# Patient Record
Sex: Female | Born: 1990 | Race: Black or African American | Hispanic: No | Marital: Single | Smoking: Former smoker
Health system: Southern US, Community
[De-identification: ages and names within clinical notes are randomized; demographics above are authoritative.]

## PROBLEM LIST (undated history)

## (undated) ENCOUNTER — Inpatient Hospital Stay (HOSPITAL_COMMUNITY): Payer: Self-pay

## (undated) DIAGNOSIS — F32A Depression, unspecified: Secondary | ICD-10-CM

## (undated) DIAGNOSIS — F329 Major depressive disorder, single episode, unspecified: Secondary | ICD-10-CM

## (undated) DIAGNOSIS — G919 Hydrocephalus, unspecified: Secondary | ICD-10-CM

## (undated) DIAGNOSIS — R7989 Other specified abnormal findings of blood chemistry: Secondary | ICD-10-CM

## (undated) DIAGNOSIS — J45909 Unspecified asthma, uncomplicated: Secondary | ICD-10-CM

## (undated) DIAGNOSIS — F319 Bipolar disorder, unspecified: Secondary | ICD-10-CM

## (undated) DIAGNOSIS — E669 Obesity, unspecified: Secondary | ICD-10-CM

## (undated) DIAGNOSIS — R945 Abnormal results of liver function studies: Secondary | ICD-10-CM

## (undated) DIAGNOSIS — I10 Essential (primary) hypertension: Secondary | ICD-10-CM

## (undated) DIAGNOSIS — A599 Trichomoniasis, unspecified: Secondary | ICD-10-CM

## (undated) HISTORY — PX: BRAIN SURGERY: SHX531

---

## 2016-01-07 ENCOUNTER — Emergency Department (HOSPITAL_COMMUNITY)
Admission: EM | Admit: 2016-01-07 | Discharge: 2016-01-07 | Disposition: A | Discharge status: Home / Self Care | Payer: Self-pay | Attending: Emergency Medicine | Admitting: Emergency Medicine

## 2016-01-07 ENCOUNTER — Encounter (HOSPITAL_COMMUNITY): Payer: Self-pay | Admitting: *Deleted

## 2016-01-07 DIAGNOSIS — L309 Dermatitis, unspecified: Secondary | ICD-10-CM | POA: Insufficient documentation

## 2016-01-07 DIAGNOSIS — L03313 Cellulitis of chest wall: Secondary | ICD-10-CM | POA: Insufficient documentation

## 2016-01-07 DIAGNOSIS — R21 Rash and other nonspecific skin eruption: Secondary | ICD-10-CM

## 2016-01-07 MED ORDER — DOXYCYCLINE HYCLATE 100 MG PO CAPS: 100.0000 mg | ORAL_CAPSULE | Freq: Two times a day (BID) | ORAL | Status: DC

## 2016-01-07 NOTE — ED Provider Notes (Signed)
CSN: [id number]     Arrival date & time 01/07/16  2103 History  By signing my name below, I, Emmanuella Mensah, attest that this documentation has been prepared under the direction and in the presence of Roxy Horseman, PA-C. Electronically Signed: Angelene Giovanni, ED Scribe. 01/07/2016. 9:58 PM.    Chief Complaint  Patient presents with  . Rash   The history is provided by the patient. No language interpreter was used.    HPI Comments: Jean Mata is a 25 y.o. female with a hx of eczema who presents to the Emergency Department complaining of gradually worsening small painful rash on bilateral nipples onset 3 days ago. She denies any discharge. She explains that she initially attributed her symptoms to her eczema but they are usually not painful. No alleviating factors noted. Pt has not taken any medications for her symptoms. Pt is currently on birth control. No fever or SOB.     History reviewed. No pertinent past medical history. History reviewed. No pertinent past surgical history. No family history on file. Social History  Substance Use Topics  . Smoking status: Never Smoker   . Smokeless tobacco: None  . Alcohol Use: Yes   OB History    No data available     Review of Systems  Constitutional: Negative for fever.  Respiratory: Negative for shortness of breath.   Skin: Positive for rash. Negative for wound.      Allergies  Review of patient's allergies indicates not on file.  Home Medications   Prior to Admission medications   Not on File   BP 136/85 mmHg  Pulse 106  Temp(Src) 98.9 F (37.2 C) (Oral)  Resp 18  SpO2 100%  LMP 09/07/2015 Physical Exam Physical Exam  Constitutional: Pt appears well-developed and well-nourished. No distress.  Awake, alert, nontoxic appearance  HENT:  Head: Normocephalic and atraumatic.  Mouth/Throat: Oropharynx is clear and moist. No oropharyngeal exudate.  Eyes: Conjunctivae are normal. No scleral icterus.  Neck: Normal  range of motion. Neck supple.  Cardiovascular: Normal rate, regular rhythm and intact distal pulses.   Pulmonary/Chest: Effort normal and breath sounds normal. No respiratory distress. Pt has no wheezes.  Equal chest expansion  Abdominal: Soft. Bowel sounds are normal. Pt exhibits no mass. There is no tenderness. There is no rebound and no guarding.  Musculoskeletal: Normal range of motion. Pt exhibits no edema.  Neurological: Pt is alert.  Speech is clear and goal oriented Moves extremities without ataxia  Skin: Skin is warm and dry. Pt is not diaphoretic. Nurse and scribe chaperone present for exam. Very mild rash near the right areola, no discharge, no abscess.  Psychiatric: Pt has a normal mood and affect.  Nursing note and vitals reviewed.  ED Course  Procedures (including critical care time) DIAGNOSTIC STUDIES: Oxygen Saturation is 100% on RA, normal by my interpretation.    COORDINATION OF CARE: 9:54 PM- Pt advised of plan for treatment and pt agrees. Pt will be placed on antibiotic to treat for superficial skin infection. Will provide resources for follow up with [facility].     MDM   Final diagnoses:  Rash  Eczema  Cellulitis of chest wall    Possible developing cellulitis vs mastitis.  No pregnant or breastfeeding.  Suspect some trauma to areolas from scratching or intimacy.  No discharge, no mass.  Will start on doxy.  Recommend close follow-up with OBGYN in 3 days.  Return for new or worsening symptoms.  I personally performed the services  described in this documentation, which was scribed in my presence. The recorded information has been reviewed and is accurate.    Roxy Horseman, PA-C 01/07/16 2204  Mancel Bale, MD 01/07/16 [phone]

## 2016-01-07 NOTE — ED Notes (Addendum)
Pt complains of painful rash on both nipples underarms for the past 3 days. Pt states she has a hx of eczema but states it is usually not painful.  Pt denies use of new soaps, detergents, lotions.

## 2016-01-07 NOTE — Discharge Instructions (Signed)
Your rash may be a developing cellulitis.  Please take the antibiotics as directed.  Please do not scratch or pick at the rash.  If you develop fever, discharge, worsening pain, or swelling return to the ER.  Otherwise, make an appointment with an OBGYN in 3 days for recheck.  Cellulitis Cellulitis is an infection of the skin and the tissue beneath it. The infected area is usually red and tender. Cellulitis occurs most often in the arms and lower legs.  CAUSES  Cellulitis is caused by bacteria that enter the skin through cracks or cuts in the skin. The most common types of bacteria that cause cellulitis are staphylococci and streptococci. SIGNS AND SYMPTOMS   Redness and warmth.  Swelling.  Tenderness or pain.  Fever. DIAGNOSIS  Your health care provider can usually determine what is wrong based on a physical exam. Blood tests may also be done. TREATMENT  Treatment usually involves taking an antibiotic medicine. HOME CARE INSTRUCTIONS   Take your antibiotic medicine as directed by your health care provider. Finish the antibiotic even if you start to feel better.  Keep the infected arm or leg elevated to reduce swelling.  Apply a warm cloth to the affected area up to 4 times per day to relieve pain.  Take medicines only as directed by your health care provider.  Keep all follow-up visits as directed by your health care provider. SEEK MEDICAL CARE IF:   You notice red streaks coming from the infected area.  Your red area gets larger or turns dark in color.  Your bone or joint underneath the infected area becomes painful after the skin has healed.  Your infection returns in the same area or another area.  You notice a swollen bump in the infected area.  You develop new symptoms.  You have a fever. SEEK IMMEDIATE MEDICAL CARE IF:   You feel very sleepy.  You develop vomiting or diarrhea.  You have a general ill feeling (malaise) with muscle aches and pains.   This  information is not intended to replace advice given to you by your health care provider. Make sure you discuss any questions you have with your health care provider.   Document Released: 09/13/2005 Document Revised: 08/25/2015 Document Reviewed: 02/19/2012 Elsevier Interactive Patient Education [employer]

## 2016-10-30 ENCOUNTER — Encounter (HOSPITAL_COMMUNITY): Payer: Self-pay

## 2016-10-30 ENCOUNTER — Emergency Department (HOSPITAL_COMMUNITY): Payer: Self-pay

## 2016-10-30 ENCOUNTER — Emergency Department (HOSPITAL_COMMUNITY)
Admission: EM | Admit: 2016-10-30 | Discharge: 2016-10-30 | Disposition: A | Discharge status: Home / Self Care | Payer: Self-pay | Attending: Emergency Medicine | Admitting: Emergency Medicine

## 2016-10-30 DIAGNOSIS — J45909 Unspecified asthma, uncomplicated: Secondary | ICD-10-CM | POA: Insufficient documentation

## 2016-10-30 DIAGNOSIS — Y939 Activity, unspecified: Secondary | ICD-10-CM | POA: Insufficient documentation

## 2016-10-30 DIAGNOSIS — Y999 Unspecified external cause status: Secondary | ICD-10-CM | POA: Insufficient documentation

## 2016-10-30 DIAGNOSIS — G919 Hydrocephalus, unspecified: Secondary | ICD-10-CM

## 2016-10-30 DIAGNOSIS — W19XXXA Unspecified fall, initial encounter: Secondary | ICD-10-CM

## 2016-10-30 DIAGNOSIS — Y92009 Unspecified place in unspecified non-institutional (private) residence as the place of occurrence of the external cause: Secondary | ICD-10-CM | POA: Insufficient documentation

## 2016-10-30 DIAGNOSIS — R42 Dizziness and giddiness: Secondary | ICD-10-CM

## 2016-10-30 HISTORY — DX: Unspecified asthma, uncomplicated: J45.909

## 2016-10-30 HISTORY — DX: Major depressive disorder, single episode, unspecified: F32.9

## 2016-10-30 HISTORY — DX: Depression, unspecified: F32.A

## 2016-10-30 MED ORDER — ACETAMINOPHEN 325 MG PO TABS
650.0000 mg | ORAL_TABLET | Freq: Once | ORAL | Status: AC
  Administered 2016-10-30: 650 mg via ORAL
  Filled 2016-10-30: qty 2

## 2016-10-30 MED ORDER — DIAZEPAM 5 MG PO TABS
5.0000 mg | ORAL_TABLET | Freq: Once | ORAL | Status: AC
  Administered 2016-10-30: 5 mg via ORAL
  Filled 2016-10-30: qty 1

## 2016-10-30 NOTE — Discharge Instructions (Signed)
You were seen today for an episode of vertigo followed by a fall. Your CT scan shows hydrocephalus. You have a history of this but report that your prior imaging has been reassuring. I cannot compare imaging today because I do not have access to your outside records. Given the you are able to walk without difficulty and your neurologic exam is otherwise unremarkable, you can be discharged home. It is very important that he follow back up with Dr. Marice PotterFuchs for repeat evaluation. If you have any new or worsening symptoms she needs to be reevaluated.

## 2016-10-30 NOTE — Progress Notes (Signed)
Ambulated well to the bathroom and to the hallway without assistance.

## 2016-10-30 NOTE — ED Provider Notes (Signed)
[facility]-EMERGENCY DEPT Provider Note   CSN: [id number] Arrival date & time: 10/30/16  0001    By signing my name below, I, Valentino SaxonBianca Contreras, attest that this documentation has been prepared under the direction and in the presence of Shon Batonourtney F Cary Lothrop, MD. Electronically Signed: Valentino SaxonBianca Contreras, ED Scribe. 10/30/16. 1:23 AM.  History   Chief Complaint Chief Complaint  Patient presents with  . Fall   The history is provided by the patient. No language interpreter was used.    HPI Comments: Jean Mata is a 25 y.o. female h/o of hydrocephalus, who presents to the Emergency Department complaining of moderate, constant, HA s/p fall that occurred at ~ 9pm this evening. Pt reports she fell twice today. She notes with the first fall, she fell and landed on her knees but was able to ambulate. She denies head injury and LOC. Pt then states she fell a second time immediately after the first fall, when she stood up after putting her shoes on. She notes she stood up and fell and hit her head on the wall and on a nearby table. Pt denies LOC. She reports associated HA to the localized area of impact. Pt also describes having accompanied dizziness with Ha. Per pt, she endures her dizziness with standing. She notes the room is spinning. Pt denies nausea, vomiting, abdominal pain. Pt reports no modifying factors noted. She also denies any recent illnesses. Pt states she has not had any drinks today, but does admit to illicit drug use. She also notes being on oral contraceptives. No additional complaints at this time.   Past Medical History:  Diagnosis Date  . Asthma   . Depression     There are no active problems to display for this patient.   Past Surgical History:  Procedure Laterality Date  . BRAIN SURGERY      OB History    No data available       Home Medications    Prior to Admission medications   Medication Sig Start Date End Date Taking? Authorizing Provider  doxycycline  (VIBRAMYCIN) 100 MG capsule Take 1 capsule (100 mg total) by mouth 2 (two) times daily. 01/07/16   Roxy Horsemanobert Browning, PA-C    Family History History reviewed. No pertinent family history.  Social History Social History  Substance Use Topics  . Smoking status: Never Smoker  . Smokeless tobacco: Never Used  . Alcohol use Yes     Allergies   Patient has no allergy information on record.   Review of Systems Review of Systems  Constitutional: Negative for fever.  Respiratory: Negative for shortness of breath.   Cardiovascular: Negative for chest pain.  Gastrointestinal: Negative for abdominal pain, nausea and vomiting.  Neurological: Positive for dizziness and headaches. Negative for syncope.  All other systems reviewed and are negative.    Physical Exam Updated Vital Signs BP 127/85 (BP Location: Left Arm)   Pulse 88   Temp 98 F (36.7 C) (Oral)   Resp 16   Ht 6\' 2"  (1.88 m)   Wt 285 lb (129.3 kg)   SpO2 98%   BMI 36.59 kg/m   Physical Exam  Constitutional: She is oriented to person, place, and time. She appears well-developed and well-nourished. No distress.  HENT:  Head: Normocephalic and atraumatic.  Eyes: EOM are normal. Pupils are equal, round, and reactive to light.  No nystagmus noted  Neck: Normal range of motion. Neck supple.  Cardiovascular: Normal rate, regular rhythm and normal heart sounds.  No murmur heard. Pulmonary/Chest: Effort normal and breath sounds normal. No respiratory distress. She has no wheezes.  Abdominal: Soft. Bowel sounds are normal. There is no tenderness. There is no guarding.  Neurological: She is alert and oriented to person, place, and time.  Cranial nerves II through X intact, 5 out of 5 strength in all 4 stories, no dysmetria to finger-nose-finger, normal gait  Skin: Skin is warm and dry.  Psychiatric: She has a normal mood and affect.  Nursing note and vitals reviewed.    ED Treatments / Results   DIAGNOSTIC  STUDIES: Oxygen Saturation is 100% on RA, normal by my interpretation.    COORDINATION OF CARE: 1:13 AM Discussed treatment plan with pt at bedside which includes Head CT w/o contrast, labs, EKG and pt agreed to plan.   Labs (all labs ordered are listed, but only abnormal results are displayed) Labs Reviewed  PREGNANCY, URINE    EKG  EKG Interpretation None       Radiology Ct Head Wo Contrast  Result Date: 10/30/2016 CLINICAL DATA:  25 y/o F; constant headache and dizziness after fall. History of hydrocephalus. EXAM: CT HEAD WITHOUT CONTRAST TECHNIQUE: Contiguous axial images were obtained from the base of the skull through the vertex without intravenous contrast. COMPARISON:  None. FINDINGS: Brain: No evidence of large territory infarct or intracranial hemorrhage. No extra-axial collection. Moderate hydrocephalus of lateral and third ventricles. Normal size fourth ventricle. Vascular: No hyperdense vessel or unexpected calcification. Skull: No calvarial fracture for right frontal pole from prior ventriculostomy catheter. Sinuses/Orbits: No acute finding. Other: None. IMPRESSION: 1. No evidence of large acute infarct or intracranial hemorrhage. 2. 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 3. Right frontal burr hole from prior ventriculostomy catheter. Electronically Signed   By: Mitzi HansenLance  Furusawa-Stratton M.D.   On: 10/30/2016 02:04    Procedures Procedures (including critical care time)  Medications Ordered in ED Medications  acetaminophen (TYLENOL) tablet 650 mg (650 mg Oral Given 10/30/16 0210)  diazepam (VALIUM) tablet 5 mg (5 mg Oral Given 10/30/16 0210)     Initial Impression / Assessment and Plan / ED Course  I have reviewed the triage vital signs and the nursing notes.  Pertinent labs & imaging results that were available during my care of the patient were reviewed by me and considered  in my medical decision making (see chart for details).  Clinical Course as of Oct 30 518  Mon Oct 30, 2016  [id number] Patient with hydrocephalus on CT scan. Reports that her last MRI was "clear."  Unfortunately, I cannot review her outside records and care everywhere. I am attempting to obtain her records from [facility]. Discussed with neurology. He recommends neurosurgery consultation and likely MRI.  [CH]    Clinical Course User Index [CH] Shon Batonourtney F Arriyah Madej, MD    Patient presents with dizziness followed by fall. Describes vertiginous symptoms. Currently neurologically intact. History of hydrocephalus. She is alert and oriented. No somnolence. Neuro exam is intact. Per [place] CT head rules, patient is low risk; however, given her history of hydrocephalus and vertiginous symptoms, will obtain CT scan to evaluate. Patient reports that "I had an MRI one year ago that was normal." I'm unable to review outside records in care everywhere. Patient reports her neurosurgeon was Dr. Marice PotterFuchs.  I have requested records. CT scan does show hydrocephalus with likely obstruction at the aqueduct. Unclear whether this is truly acute. She is neurologically intact and  ambulatory. She's currently symptom-free. Discuss with neurosurgery, Dr. Conchita Paris, who does not feel the patient needs further imaging at this time. If neurologic exam is reassuring, patient to follow-up with her neurosurgeon as an outpatient. Discussed this with the patient. Patient stated understanding.  After history, exam, and medical workup I feel the patient has been appropriately medically screened and is safe for discharge home. Pertinent diagnoses were discussed with the patient. Patient was given return precautions.   Final Clinical Impressions(s) / ED Diagnoses   Final diagnoses:  Vertigo  Fall, initial encounter  Hydrocephalus    New Prescriptions New Prescriptions   No medications on file   I personally performed the services described in  this documentation, which was scribed in my presence. The recorded information has been reviewed and is accurate.     Shon Baton, MD 10/30/16 [phone]

## 2016-10-30 NOTE — ED Triage Notes (Signed)
Fell twice and home hitting head now pt states she doesn't know what day it is and head pain voiced no N/V noted steady gait noted.

## 2017-03-15 ENCOUNTER — Encounter (HOSPITAL_BASED_OUTPATIENT_CLINIC_OR_DEPARTMENT_OTHER): Payer: Self-pay | Admitting: Emergency Medicine

## 2017-03-15 ENCOUNTER — Emergency Department (HOSPITAL_BASED_OUTPATIENT_CLINIC_OR_DEPARTMENT_OTHER)
Admission: EM | Admit: 2017-03-15 | Discharge: 2017-03-15 | Disposition: A | Discharge status: Home / Self Care | Payer: Self-pay | Attending: Emergency Medicine | Admitting: Emergency Medicine

## 2017-03-15 DIAGNOSIS — R197 Diarrhea, unspecified: Secondary | ICD-10-CM | POA: Insufficient documentation

## 2017-03-15 DIAGNOSIS — J45909 Unspecified asthma, uncomplicated: Secondary | ICD-10-CM | POA: Insufficient documentation

## 2017-03-15 DIAGNOSIS — R1032 Left lower quadrant pain: Secondary | ICD-10-CM | POA: Insufficient documentation

## 2017-03-15 DIAGNOSIS — R1031 Right lower quadrant pain: Secondary | ICD-10-CM | POA: Insufficient documentation

## 2017-03-15 DIAGNOSIS — R112 Nausea with vomiting, unspecified: Secondary | ICD-10-CM | POA: Insufficient documentation

## 2017-03-15 HISTORY — DX: Hydrocephalus, unspecified: G91.9

## 2017-03-15 HISTORY — DX: Obesity, unspecified: E66.9

## 2017-03-15 LAB — CBC WITH DIFFERENTIAL/PLATELET
Basophils Absolute: 0 K/uL (ref 0.0–0.1)
Basophils Relative: 0 %
Eosinophils Absolute: 0.1 K/uL (ref 0.0–0.7)
Eosinophils Relative: 1 %
HCT: 39 % (ref 36.0–46.0)
Hemoglobin: 13.1 g/dL (ref 12.0–15.0)
Lymphocytes Relative: 33 %
Lymphs Abs: 3 K/uL (ref 0.7–4.0)
MCH: 29.4 pg (ref 26.0–34.0)
MCHC: 33.6 g/dL (ref 30.0–36.0)
MCV: 87.4 fL (ref 78.0–100.0)
Monocytes Absolute: 0.8 K/uL (ref 0.1–1.0)
Monocytes Relative: 9 %
Neutro Abs: 5.3 K/uL (ref 1.7–7.7)
Neutrophils Relative %: 57 %
Platelets: 414 K/uL — ABNORMAL HIGH (ref 150–400)
RBC: 4.46 MIL/uL (ref 3.87–5.11)
RDW: 13.1 % (ref 11.5–15.5)
WBC: 9.3 K/uL (ref 4.0–10.5)

## 2017-03-15 LAB — URINALYSIS, ROUTINE W REFLEX MICROSCOPIC
Glucose, UA: NEGATIVE mg/dL
Ketones, ur: NEGATIVE mg/dL
NITRITE: NEGATIVE
PH: 5.5 (ref 5.0–8.0)
Protein, ur: 30 mg/dL — AB
SPECIFIC GRAVITY, URINE: 1.03 (ref 1.005–1.030)

## 2017-03-15 LAB — COMPREHENSIVE METABOLIC PANEL WITH GFR
ALT: 47 U/L (ref 14–54)
AST: 38 U/L (ref 15–41)
Albumin: 4 g/dL (ref 3.5–5.0)
Alkaline Phosphatase: 64 U/L (ref 38–126)
Anion gap: 14 (ref 5–15)
BUN: 9 mg/dL (ref 6–20)
CO2: 23 mmol/L (ref 22–32)
Calcium: 9.4 mg/dL (ref 8.9–10.3)
Chloride: 102 mmol/L (ref 101–111)
Creatinine, Ser: 0.71 mg/dL (ref 0.44–1.00)
GFR calc Af Amer: >60 mL/min
GFR calc non Af Amer: >60 mL/min
Glucose, Bld: 88 mg/dL (ref 65–99)
Potassium: 3.7 mmol/L (ref 3.5–5.1)
Sodium: 139 mmol/L (ref 135–145)
Total Bilirubin: 1.1 mg/dL (ref 0.3–1.2)
Total Protein: 8.4 g/dL — ABNORMAL HIGH (ref 6.5–8.1)

## 2017-03-15 LAB — URINALYSIS, MICROSCOPIC (REFLEX)

## 2017-03-15 LAB — PREGNANCY, URINE: Preg Test, Ur: NEGATIVE

## 2017-03-15 MED ORDER — ONDANSETRON HCL 4 MG/2ML IJ SOLN
4.0000 mg | Freq: Once | INTRAMUSCULAR | Status: AC
  Administered 2017-03-15: 4 mg via INTRAVENOUS
  Filled 2017-03-15: qty 2

## 2017-03-15 MED ORDER — DICYCLOMINE HCL 10 MG PO CAPS
10.0000 mg | ORAL_CAPSULE | Freq: Once | ORAL | Status: AC
  Administered 2017-03-15: 10 mg via ORAL
  Filled 2017-03-15: qty 1

## 2017-03-15 MED ORDER — SODIUM CHLORIDE 0.9 % IV BOLUS (SEPSIS)
1000.0000 mL | Freq: Once | INTRAVENOUS | Status: AC
  Administered 2017-03-15: 1000 mL via INTRAVENOUS

## 2017-03-15 MED ORDER — ONDANSETRON 4 MG PO TBDP: 4.0000 mg | ORAL_TABLET | Freq: Three times a day (TID) | ORAL | 0 refills | Status: DC | PRN

## 2017-03-15 NOTE — ED Notes (Signed)
ED Provider at bedside discussing test results and plan of care. 

## 2017-03-15 NOTE — ED Provider Notes (Signed)
[facility] Provider Note   CSN: [id number] Arrival date & time: 03/15/17  1757  By signing my name below, I, Modena JanskyAlbert Thayil, attest that this documentation has been prepared under the direction and in the presence of non-physician practitioner, Chase PicketJaime Pilcher Celso Granja, PA-C. Electronically Signed: Modena JanskyAlbert Thayil, Scribe. 03/15/2017. 6:24 PM.  History   Chief Complaint Chief Complaint  Patient presents with  . Diarrhea   The history is provided by the patient. No language interpreter was used.   HPI Comments: Jean Mata is a 26 y.o. female who presents to the Emergency Department complaining of constant moderate bilateral lower abdominal pain that started about 4 days ago. She ate dinner at work 5 days ago and suspects food poisoning. She took Tylenol PTA with minimal relief. Her pain is described as a cramping sensation. She reports associated dry mouth, nausea, vomiting (one episode on first day), and diarrhea (exacerbated by PO intake). She notes multiple non-bloody loose stools daily, three today prior to arrival. She denies any fevers, vaginal discharge, urinary urgency/frequency, dysuria, chest pain, shortness of breath, cough or other complaints.   Past Medical History:  Diagnosis Date  . Asthma   . Depression   . Hydrocephalus   . Obesity     There are no active problems to display for this patient.   Past Surgical History:  Procedure Laterality Date  . BRAIN SURGERY      OB History    No data available       Home Medications    Prior to Admission medications   Medication Sig Start Date End Date Taking? Authorizing Provider  levonorgestrel-ethinyl estradiol (NORDETTE) 0.15-30 MG-MCG tablet Take 1 tablet by mouth daily.   Yes Historical Provider, MD  doxycycline (VIBRAMYCIN) 100 MG capsule Take 1 capsule (100 mg total) by mouth 2 (two) times daily. 01/07/16   Roxy Horsemanobert Browning, PA-C  ondansetron (ZOFRAN ODT) 4 MG disintegrating tablet Take 1 tablet (4 mg  total) by mouth every 8 (eight) hours as needed for nausea or vomiting. 03/15/17   Chase PicketJaime Pilcher Esaul Dorwart, PA-C    Family History No family history on file.  Social History Social History  Substance Use Topics  . Smoking status: Never Smoker  . Smokeless tobacco: Never Used  . Alcohol use Yes     Comment: occ     Allergies   Patient has no known allergies.   Review of Systems Review of Systems  Gastrointestinal: Positive for abdominal pain (Lower), diarrhea, nausea and vomiting (Once, now resolved).  All other systems reviewed and are negative.    Physical Exam Updated Vital Signs BP (!) 144/97 (BP Location: Left Arm)   Pulse (!) 102   Temp 98.7 F (37.1 C) (Oral)   Resp (!) 22   Ht 6\' 2"  (1.88 m)   Wt 285 lb (129.3 kg)   LMP 11/15/2016 (Approximate)   SpO2 100%   BMI 36.59 kg/m   Physical Exam  Constitutional: She is oriented to person, place, and time. She appears well-developed and well-nourished. No distress.  Non-toxic appearing.  HENT:  Head: Normocephalic and atraumatic.  Cardiovascular: Normal rate, regular rhythm and normal heart sounds.   No murmur heard. Pulmonary/Chest: Effort normal and breath sounds normal. No respiratory distress.  Abdominal: Soft. She exhibits no distension. There is tenderness. There is no rebound and no guarding.  TTP across lower abdomen with no rebound or guarding. No CVA tenderness.  Musculoskeletal: She exhibits no edema.  Neurological: She is alert and oriented to  person, place, and time.  Skin: Skin is warm and dry. Capillary refill takes 2 to 3 seconds.  Nursing note and vitals reviewed.    ED Treatments / Results  DIAGNOSTIC STUDIES: Oxygen Saturation is 100% on RA, normal by my interpretation.    COORDINATION OF CARE: 6:28 PM- Pt advised of plan for treatment and pt agrees.  Labs (all labs ordered are listed, but only abnormal results are displayed) Labs Reviewed  URINALYSIS, ROUTINE W REFLEX MICROSCOPIC -  Abnormal; Notable for the following:       Result Value   Color, Urine AMBER (*)    APPearance CLOUDY (*)    Hgb urine dipstick MODERATE (*)    Bilirubin Urine SMALL (*)    Protein, ur 30 (*)    Leukocytes, UA TRACE (*)    All other components within normal limits  COMPREHENSIVE METABOLIC PANEL - Abnormal; Notable for the following:    Total Protein 8.4 (*)    All other components within normal limits  CBC WITH DIFFERENTIAL/PLATELET - Abnormal; Notable for the following:    Platelets 414 (*)    All other components within normal limits  URINALYSIS, MICROSCOPIC (REFLEX) - Abnormal; Notable for the following:    Bacteria, UA FEW (*)    Squamous Epithelial / LPF 6-30 (*)    All other components within normal limits  PREGNANCY, URINE    EKG  EKG Interpretation None       Radiology No results found.  Procedures Procedures (including critical care time)  Medications Ordered in ED Medications  sodium chloride 0.9 % bolus 1,000 mL (0 mLs Intravenous Stopped 03/15/17 1927)  dicyclomine (BENTYL) capsule 10 mg (10 mg Oral Given 03/15/17 1840)  ondansetron (ZOFRAN) injection 4 mg (4 mg Intravenous Given 03/15/17 2006)     Initial Impression / Assessment and Plan / ED Course  I have reviewed the triage vital signs and the nursing notes.  Pertinent labs & imaging results that were available during my care of the patient were reviewed by me and considered in my medical decision making (see chart for details).    Jean Mata is a 26 y.o. female who presents to ED for n/v/d x 4 days. No emesis over the last three days, diarrhea has persisted. On exam, patient is afebrile, hemodynamically stable with nonsurgical abdomen. She does seem mildly dehydrated, fluid bolus given. Labs reviewed and reassuring. Urine does not appear infectious, but does show signs of dehydration as well. Sxs likely 2/2 viral illness. On reevaluation, abdominal exam improved. Patient is tolerating PO. Rx for  Zofran given. Reasons to return to the ER were discussed and all questions answered.   Final Clinical Impressions(s) / ED Diagnoses   Final diagnoses:  Nausea vomiting and diarrhea    New Prescriptions New Prescriptions   ONDANSETRON (ZOFRAN ODT) 4 MG DISINTEGRATING TABLET    Take 1 tablet (4 mg total) by mouth every 8 (eight) hours as needed for nausea or vomiting.   I personally performed the services described in this documentation, which was scribed in my presence. The recorded information has been reviewed and is accurate.     [facility] Righteous Claiborne, PA-C 03/15/17 2017    Geoffery Lyons, MD 03/15/17 2118

## 2017-03-15 NOTE — ED Triage Notes (Signed)
Low abd pain x4 days after eating at a dinner at work.  Sts she believes she got food poisoning.  Vomiting on day one but not since.  Diarrhea and abd pain only.

## 2017-03-15 NOTE — Discharge Instructions (Signed)
Follow up with your primary care doctor to discuss your hospital visit. Continue to hydrate orally with small sips of fluids throughout the day. Use Zofran as directed for nausea & vomiting.  ° °The 'BRAT' diet is suggested, then progress to diet as tolerated as symptoms abate.  °Bananas.  °Rice.  °Applesauce.  °Toast (and other simple starches such as crackers, potatoes, noodles).  ° °SEEK IMMEDIATE MEDICAL ATTENTION IF: °You begin having localized abdominal pain that does not go away or becomes severe °A temperature above 101 develops °Repeated vomiting occurs (multiple uncontrollable episodes) or you are unable to keep fluids down °Blood is being passed in stools or vomit (bright red or black tarry stools).  °If you develop chest pain, difficulty breathing, dizziness or fainting, or become confused, poorly responsive, or inconsolable (young children). °

## 2018-01-08 ENCOUNTER — Encounter (HOSPITAL_BASED_OUTPATIENT_CLINIC_OR_DEPARTMENT_OTHER): Payer: Self-pay

## 2018-01-08 ENCOUNTER — Emergency Department (HOSPITAL_BASED_OUTPATIENT_CLINIC_OR_DEPARTMENT_OTHER)
Admission: EM | Admit: 2018-01-08 | Discharge: 2018-01-08 | Disposition: A | Discharge status: Home / Self Care | Payer: Federal, State, Local not specified - PPO | Attending: Emergency Medicine | Admitting: Emergency Medicine

## 2018-01-08 ENCOUNTER — Other Ambulatory Visit: Payer: Self-pay

## 2018-01-08 DIAGNOSIS — J45909 Unspecified asthma, uncomplicated: Secondary | ICD-10-CM | POA: Diagnosis not present

## 2018-01-08 DIAGNOSIS — Z87891 Personal history of nicotine dependence: Secondary | ICD-10-CM | POA: Diagnosis not present

## 2018-01-08 DIAGNOSIS — F329 Major depressive disorder, single episode, unspecified: Secondary | ICD-10-CM | POA: Insufficient documentation

## 2018-01-08 DIAGNOSIS — R112 Nausea with vomiting, unspecified: Secondary | ICD-10-CM | POA: Insufficient documentation

## 2018-01-08 DIAGNOSIS — R197 Diarrhea, unspecified: Secondary | ICD-10-CM | POA: Insufficient documentation

## 2018-01-08 LAB — CBC
HEMATOCRIT: 39.4 % (ref 36.0–46.0)
HEMOGLOBIN: 13.1 g/dL (ref 12.0–15.0)
MCH: 29.4 pg (ref 26.0–34.0)
MCHC: 33.2 g/dL (ref 30.0–36.0)
MCV: 88.3 fL (ref 78.0–100.0)
Platelets: 392 10*3/uL (ref 150–400)
RBC: 4.46 MIL/uL (ref 3.87–5.11)
RDW: 13 % (ref 11.5–15.5)
WBC: 10.7 10*3/uL — ABNORMAL HIGH (ref 4.0–10.5)

## 2018-01-08 LAB — COMPREHENSIVE METABOLIC PANEL
ALBUMIN: 4.1 g/dL (ref 3.5–5.0)
ALT: 12 U/L — ABNORMAL LOW (ref 14–54)
ANION GAP: 9 (ref 5–15)
AST: 17 U/L (ref 15–41)
Alkaline Phosphatase: 67 U/L (ref 38–126)
BILIRUBIN TOTAL: 0.8 mg/dL (ref 0.3–1.2)
BUN: 10 mg/dL (ref 6–20)
CHLORIDE: 105 mmol/L (ref 101–111)
CO2: 24 mmol/L (ref 22–32)
Calcium: 9.3 mg/dL (ref 8.9–10.3)
Creatinine, Ser: 0.84 mg/dL (ref 0.44–1.00)
GFR calc Af Amer: >60 mL/min (ref >60)
GFR calc non Af Amer: >60 mL/min (ref >60)
GLUCOSE: 101 mg/dL — AB (ref 65–99)
POTASSIUM: 3.3 mmol/L — AB (ref 3.5–5.1)
SODIUM: 138 mmol/L (ref 135–145)
TOTAL PROTEIN: 8.8 g/dL — AB (ref 6.5–8.1)

## 2018-01-08 LAB — URINALYSIS, ROUTINE W REFLEX MICROSCOPIC
Glucose, UA: NEGATIVE mg/dL
Ketones, ur: >80 mg/dL — AB
LEUKOCYTES UA: NEGATIVE
Nitrite: NEGATIVE
PROTEIN: NEGATIVE mg/dL
pH: 6 (ref 5.0–8.0)

## 2018-01-08 LAB — URINALYSIS, MICROSCOPIC (REFLEX): WBC UA: NONE SEEN WBC/hpf (ref 0–5)

## 2018-01-08 LAB — PREGNANCY, URINE: Preg Test, Ur: NEGATIVE

## 2018-01-08 LAB — LIPASE, BLOOD: LIPASE: 23 U/L (ref 11–51)

## 2018-01-08 MED ORDER — SODIUM CHLORIDE 0.9 % IV BOLUS (SEPSIS)
1000.0000 mL | Freq: Once | INTRAVENOUS | Status: AC
Start: 2018-01-08 — End: 2018-01-08
  Administered 2018-01-08: 1000 mL via INTRAVENOUS

## 2018-01-08 MED ORDER — ONDANSETRON 4 MG PO TBDP: 4.0000 mg | ORAL_TABLET | Freq: Three times a day (TID) | ORAL | 0 refills | Status: DC | PRN

## 2018-01-08 MED ORDER — DIPHENOXYLATE-ATROPINE 2.5-0.025 MG PO TABS
2.0000 | ORAL_TABLET | Freq: Once | ORAL | Status: AC
  Administered 2018-01-08: 2 via ORAL
  Filled 2018-01-08: qty 2

## 2018-01-08 MED ORDER — DIPHENHYDRAMINE HCL 50 MG/ML IJ SOLN
25.0000 mg | Freq: Once | INTRAMUSCULAR | Status: AC
  Administered 2018-01-08: 25 mg via INTRAVENOUS
  Filled 2018-01-08: qty 1

## 2018-01-08 MED ORDER — ONDANSETRON HCL 4 MG/2ML IJ SOLN
4.0000 mg | Freq: Once | INTRAMUSCULAR | Status: AC
  Administered 2018-01-08: 4 mg via INTRAVENOUS
  Filled 2018-01-08: qty 2

## 2018-01-08 NOTE — ED Triage Notes (Signed)
C/o n/v/d x 2 days-NAD-steady gait 

## 2018-01-08 NOTE — ED Notes (Signed)
ED Provider at bedside. 

## 2018-01-08 NOTE — ED Provider Notes (Signed)
[facility] EMERGENCY DEPARTMENT Provider Note   CSN: [id number] Arrival date & time: 01/08/18  1607     History   Chief Complaint Chief Complaint  Patient presents with  . Emesis    HPI Jean Mata is a 27 y.o. female.  Chief complaint is 2 days of nausea, vomiting, and diarrhea.  HPI 27 year old female.  In her normal state of health yesterday morning.  She awakened with some nausea.  She has vomiting to the course of the day.  By about 3:00 in the afternoon had developed diarrhea with some mild intermittent abdominal cramping.  Her nausea has improved.  No blood pus or mucus in her stools.  Heme-negative nonbilious emesis.  No localizing abdominal pain.  No headache.  Past Medical History:  Diagnosis Date  . Asthma   . Depression   . Hydrocephalus   . Obesity     There are no active problems to display for this patient.   Past Surgical History:  Procedure Laterality Date  . BRAIN SURGERY      OB History    No data available       Home Medications    Prior to Admission medications   Medication Sig Start Date End Date Taking? Authorizing Provider  ondansetron (ZOFRAN ODT) 4 MG disintegrating tablet Take 1 tablet (4 mg total) by mouth every 8 (eight) hours as needed for nausea. 01/08/18   Rolland Porter, MD    Family History No family history on file.  Social History Social History   Tobacco Use  . Smoking status: Former Games developer  . Smokeless tobacco: Never Used  Substance Use Topics  . Alcohol use: Yes    Comment: occ  . Drug use: Yes    Types: Marijuana     Allergies   Patient has no known allergies.   Review of Systems Review of Systems  Constitutional: Negative for appetite change, chills, diaphoresis, fatigue and fever.  HENT: Negative for mouth sores, sore throat and trouble swallowing.   Eyes: Negative for visual disturbance.  Respiratory: Negative for cough, chest tightness, shortness of breath and wheezing.   Cardiovascular:  Negative for chest pain.  Gastrointestinal: Positive for diarrhea, nausea and vomiting. Negative for abdominal distention and abdominal pain.  Endocrine: Negative for polydipsia, polyphagia and polyuria.  Genitourinary: Negative for dysuria, frequency and hematuria.  Musculoskeletal: Negative for gait problem.  Skin: Negative for color change, pallor and rash.  Neurological: Negative for dizziness, syncope, light-headedness and headaches.  Hematological: Does not bruise/bleed easily.  Psychiatric/Behavioral: Negative for behavioral problems and confusion.     Physical Exam Updated Vital Signs BP (!) 140/92 (BP Location: Left Arm)   Pulse 84   Temp 98.6 F (37 C) (Oral)   Resp 16   Ht 6' (1.829 m)   Wt 117.9 kg (260 lb)   LMP 11/24/2017   SpO2 100%   BMI 35.26 kg/m   Physical Exam  Constitutional: She is oriented to person, place, and time. She appears well-developed and well-nourished. No distress.  HENT:  Head: Normocephalic.  Eyes: Conjunctivae are normal. Pupils are equal, round, and reactive to light. No scleral icterus.  Neck: Normal range of motion. Neck supple. No thyromegaly present.  Cardiovascular: Normal rate and regular rhythm. Exam reveals no gallop and no friction rub.  No murmur heard. Pulmonary/Chest: Effort normal and breath sounds normal. No respiratory distress. She has no wheezes. She has no rales.  Abdominal: Soft. Bowel sounds are normal. She exhibits no distension. There  is no tenderness. There is no rebound.  Abdomen soft.  Nondistended.  Nontender.  No focal abnormalities or peritoneal irritation.  Musculoskeletal: Normal range of motion.  Neurological: She is alert and oriented to person, place, and time.  Skin: Skin is warm and dry. No rash noted.  Psychiatric: She has a normal mood and affect. Her behavior is normal.     ED Treatments / Results  Labs (all labs ordered are listed, but only abnormal results are displayed) Labs Reviewed    URINALYSIS, ROUTINE W REFLEX MICROSCOPIC - Abnormal; Notable for the following components:      Result Value   APPearance HAZY (*)    Specific Gravity, Urine >1.030 (*)    Hgb urine dipstick MODERATE (*)    Bilirubin Urine SMALL (*)    Ketones, ur >80 (*)    All other components within normal limits  COMPREHENSIVE METABOLIC PANEL - Abnormal; Notable for the following components:   Potassium 3.3 (*)    Glucose, Bld 101 (*)    Total Protein 8.8 (*)    ALT 12 (*)    All other components within normal limits  CBC - Abnormal; Notable for the following components:   WBC 10.7 (*)    All other components within normal limits  URINALYSIS, MICROSCOPIC (REFLEX) - Abnormal; Notable for the following components:   Bacteria, UA RARE (*)    Squamous Epithelial / LPF 6-30 (*)    All other components within normal limits  PREGNANCY, URINE  LIPASE, BLOOD    EKG  EKG Interpretation None       Radiology No results found.  Procedures Procedures (including critical care time)  Medications Ordered in ED Medications  ondansetron (ZOFRAN) injection 4 mg (4 mg Intravenous Given 01/08/18 1717)  diphenhydrAMINE (BENADRYL) injection 25 mg (25 mg Intravenous Given 01/08/18 1717)  diphenoxylate-atropine (LOMOTIL) 2.5-0.025 MG per tablet 2 tablet (2 tablets Oral Given 01/08/18 1716)  sodium chloride 0.9 % bolus 1,000 mL (1,000 mLs Intravenous New Bag/Given 01/08/18 1717)     Initial Impression / Assessment and Plan / ED Course  I have reviewed the triage vital signs and the nursing notes.  Pertinent labs & imaging results that were available during my care of the patient were reviewed by me and considered in my medical decision making (see chart for details).     Showing labs.  Not pregnant.  Given IV fluids, antiemetics.  Lomotil.  It is improved.  Taking p.o.  Plan to discharge home Lomotil, Bentyl, Zofran as needed.  Clear liquids.  Slowly advance diet.  Recheck any worsening.  Final  Clinical Impressions(s) / ED Diagnoses   Final diagnoses:  Nausea and vomiting, intractability of vomiting not specified, unspecified vomiting type    ED Discharge Orders        Ordered    ondansetron (ZOFRAN ODT) 4 MG disintegrating tablet  Every 8 hours PRN     01/08/18 1906       Rolland PorterJames, Francies Inch, MD 01/08/18 1909

## 2018-01-08 NOTE — Discharge Instructions (Signed)
Clear liquids.  Slowly advance your diet as tolerated.  Zofran for nausea.

## 2018-01-08 NOTE — ED Notes (Signed)
Pt verbalizes understanding of d/c instructions and denies any further needs at this time. 

## 2018-01-18 ENCOUNTER — Emergency Department (HOSPITAL_COMMUNITY)
Admission: EM | Admit: 2018-01-18 | Discharge: 2018-01-18 | Disposition: A | Discharge status: Home / Self Care | Payer: Self-pay | Attending: Emergency Medicine | Admitting: Emergency Medicine

## 2018-01-18 DIAGNOSIS — R1013 Epigastric pain: Secondary | ICD-10-CM | POA: Insufficient documentation

## 2018-01-18 DIAGNOSIS — R197 Diarrhea, unspecified: Secondary | ICD-10-CM | POA: Insufficient documentation

## 2018-01-18 DIAGNOSIS — B349 Viral infection, unspecified: Secondary | ICD-10-CM | POA: Insufficient documentation

## 2018-01-18 DIAGNOSIS — R112 Nausea with vomiting, unspecified: Secondary | ICD-10-CM | POA: Insufficient documentation

## 2018-01-18 DIAGNOSIS — Z87891 Personal history of nicotine dependence: Secondary | ICD-10-CM | POA: Insufficient documentation

## 2018-01-18 LAB — COMPREHENSIVE METABOLIC PANEL
ALT: 13 U/L — ABNORMAL LOW (ref 14–54)
ANION GAP: 9 (ref 5–15)
AST: 22 U/L (ref 15–41)
Albumin: 3.7 g/dL (ref 3.5–5.0)
Alkaline Phosphatase: 54 U/L (ref 38–126)
BILIRUBIN TOTAL: 0.4 mg/dL (ref 0.3–1.2)
BUN: 7 mg/dL (ref 6–20)
CO2: 22 mmol/L (ref 22–32)
Calcium: 9 mg/dL (ref 8.9–10.3)
Chloride: 107 mmol/L (ref 101–111)
Creatinine, Ser: 0.79 mg/dL (ref 0.44–1.00)
Glucose, Bld: 118 mg/dL — ABNORMAL HIGH (ref 65–99)
POTASSIUM: 3.2 mmol/L — AB (ref 3.5–5.1)
Sodium: 138 mmol/L (ref 135–145)
TOTAL PROTEIN: 7.8 g/dL (ref 6.5–8.1)

## 2018-01-18 LAB — URINALYSIS, ROUTINE W REFLEX MICROSCOPIC
Bilirubin Urine: NEGATIVE
Glucose, UA: >=500 mg/dL — AB
Ketones, ur: 5 mg/dL — AB
Leukocytes, UA: NEGATIVE
NITRITE: NEGATIVE
PH: 9 — AB (ref 5.0–8.0)
Protein, ur: 30 mg/dL — AB
SPECIFIC GRAVITY, URINE: 1.013 (ref 1.005–1.030)

## 2018-01-18 LAB — CBC WITH DIFFERENTIAL/PLATELET
BASOS ABS: 0 10*3/uL (ref 0.0–0.1)
BASOS PCT: 1 %
Eosinophils Absolute: 0 10*3/uL (ref 0.0–0.7)
Eosinophils Relative: 0 %
HEMATOCRIT: 34.7 % — AB (ref 36.0–46.0)
Hemoglobin: 11.5 g/dL — ABNORMAL LOW (ref 12.0–15.0)
Lymphocytes Relative: 20 %
Lymphs Abs: 1.7 10*3/uL (ref 0.7–4.0)
MCH: 29.6 pg (ref 26.0–34.0)
MCHC: 33.1 g/dL (ref 30.0–36.0)
MCV: 89.2 fL (ref 78.0–100.0)
Monocytes Absolute: 0.6 10*3/uL (ref 0.1–1.0)
Monocytes Relative: 7 %
NEUTROS ABS: 6.2 10*3/uL (ref 1.7–7.7)
Neutrophils Relative %: 72 %
PLATELETS: 368 10*3/uL (ref 150–400)
RBC: 3.89 MIL/uL (ref 3.87–5.11)
RDW: 13.3 % (ref 11.5–15.5)
WBC: 8.5 10*3/uL (ref 4.0–10.5)

## 2018-01-18 LAB — PREGNANCY, URINE: Preg Test, Ur: NEGATIVE

## 2018-01-18 LAB — LIPASE, BLOOD: LIPASE: 20 U/L (ref 11–51)

## 2018-01-18 MED ORDER — ONDANSETRON 4 MG PO TBDP: ORAL_TABLET | ORAL | 0 refills | Status: DC

## 2018-01-18 MED ORDER — POTASSIUM CHLORIDE CRYS ER 20 MEQ PO TBCR
40.0000 meq | EXTENDED_RELEASE_TABLET | Freq: Once | ORAL | Status: AC
  Administered 2018-01-18: 40 meq via ORAL
  Filled 2018-01-18: qty 2

## 2018-01-18 MED ORDER — SODIUM CHLORIDE 0.9 % IV BOLUS (SEPSIS)
1000.0000 mL | Freq: Once | INTRAVENOUS | Status: AC
  Administered 2018-01-18: 1000 mL via INTRAVENOUS

## 2018-01-18 MED ORDER — ONDANSETRON HCL 4 MG/2ML IJ SOLN
4.0000 mg | Freq: Once | INTRAMUSCULAR | Status: AC
  Administered 2018-01-18: 4 mg via INTRAVENOUS
  Filled 2018-01-18: qty 2

## 2018-01-18 MED ORDER — POTASSIUM CHLORIDE CRYS ER 20 MEQ PO TBCR: 20.0000 meq | EXTENDED_RELEASE_TABLET | Freq: Every day | ORAL | 0 refills | Status: DC

## 2018-01-18 MED ORDER — METOCLOPRAMIDE HCL 5 MG/ML IJ SOLN
10.0000 mg | Freq: Once | INTRAMUSCULAR | Status: AC
  Administered 2018-01-18: 10 mg via INTRAVENOUS
  Filled 2018-01-18: qty 2

## 2018-01-18 NOTE — ED Provider Notes (Signed)
[facility]-EMERGENCY DEPT Provider Note   CSN: [id number] Arrival date & time: 01/18/18  0700     History   Chief Complaint Chief Complaint  Patient presents with  . Influenza    HPI  Jean Mata is a 27 y.o. Female with a history of asthma and depression, presents to the ED for evaluation of nausea vomiting and diarrhea.  Patient reports symptoms started around 3 PM yesterday, and have persisted through the night.  Patient reports some associated epigastric cramping, denies any other abdominal pain.  Emesis has been nonbloody and nonbilious, patient denies any blood in the stool.  No fevers or chills, no dysuria, frequency or flank pain.  Patient was evaluated for similar symptoms on 1/22, reports this started after she took all of her medications at once, she did the same thing yesterday and thinks it may have started her symptoms as well.  Patient denies any associated coughing, rhinorrhea, sore throat, no chest pain or shortness of breath.  She denies any vaginal discharge or pelvic pain.      Past Medical History:  Diagnosis Date  . Asthma   . Depression   . Hydrocephalus   . Obesity     There are no active problems to display for this patient.   Past Surgical History:  Procedure Laterality Date  . BRAIN SURGERY      OB History    No data available       Home Medications    Prior to Admission medications   Medication Sig Start Date End Date Taking? Authorizing Provider  acetaminophen (TYLENOL) 500 MG tablet Take 1,000 mg by mouth every 6 (six) hours as needed for mild pain, moderate pain, fever or headache.   Yes [provider]  ARIPiprazole (ABILIFY) 15 MG tablet Take 15 mg by mouth daily.   Yes [provider]  levonorgestrel-ethinyl estradiol (LESSINA-28) 0.1-20 MG-MCG tablet Take 1 tablet by mouth daily.   Yes [provider]  ondansetron (ZOFRAN ODT) 4 MG disintegrating tablet 4mg  ODT q4 hours prn  nausea/vomit 01/18/18   Dartha LodgeFord, Del Overfelt N, PA-C  potassium chloride SA (K-DUR,KLOR-CON) 20 MEQ tablet Take 1 tablet (20 mEq total) by mouth daily. 01/18/18   Dartha LodgeFord, Emelina Hinch N, PA-C    Family History No family history on file.  Social History Social History   Tobacco Use  . Smoking status: Former Games developermoker  . Smokeless tobacco: Never Used  Substance Use Topics  . Alcohol use: Yes    Comment: occ  . Drug use: Yes    Types: Marijuana     Allergies   Patient has no known allergies.   Review of Systems Review of Systems  Constitutional: Negative for chills and fever.  HENT: Negative for congestion, rhinorrhea and sore throat.   Eyes: Negative for visual disturbance.  Respiratory: Negative for cough and shortness of breath.   Cardiovascular: Negative for chest pain.  Gastrointestinal: Positive for abdominal pain, diarrhea, nausea and vomiting. Negative for blood in stool.  Genitourinary: Negative for dysuria, flank pain, frequency, pelvic pain, vaginal bleeding, vaginal discharge and vaginal pain.  Musculoskeletal: Negative for arthralgias and myalgias.  Skin: Negative for color change, pallor and rash.  Neurological: Negative for dizziness, weakness, light-headedness and numbness.     Physical Exam Updated Vital Signs BP 136/83 (BP Location: Right Arm)   Pulse 73   Temp (!) 97.5 F (36.4 C) (Axillary)   Resp 16   SpO2 100%   Physical Exam  Constitutional: She  appears well-developed and well-nourished. No distress.  Actively vomiting on initial evaluation  HENT:  Head: Normocephalic and atraumatic.  Mouth/Throat: Oropharynx is clear and moist.  Eyes: Right eye exhibits no discharge. Left eye exhibits no discharge.  Neck: Neck supple.  Cardiovascular: Normal rate, regular rhythm and normal heart sounds.  Pulmonary/Chest: Effort normal and breath sounds normal. No stridor. No respiratory distress. She has no wheezes. She has no rales.  Abdominal: Soft. Bowel sounds are normal.    Abdomen soft, nondistended, bowel sounds normal, minimal tenderness in the epigastrium, no guarding or rebound tenderness, abdomen otherwise palpation no peritoneal signs  Musculoskeletal: She exhibits no deformity.  Lymphadenopathy:    She has no cervical adenopathy.  Neurological: She is alert. Coordination normal.  Skin: Skin is warm and dry. Capillary refill takes less than 2 seconds. She is not diaphoretic.  Psychiatric: She has a normal mood and affect. Her behavior is normal.  Nursing note and vitals reviewed.    ED Treatments / Results  Labs (all labs ordered are listed, but only abnormal results are displayed) Labs Reviewed  COMPREHENSIVE METABOLIC PANEL - Abnormal; Notable for the following components:      Result Value   Potassium 3.2 (*)    Glucose, Bld 118 (*)    ALT 13 (*)    All other components within normal limits  CBC WITH DIFFERENTIAL/PLATELET - Abnormal; Notable for the following components:   Hemoglobin 11.5 (*)    HCT 34.7 (*)    All other components within normal limits  URINALYSIS, ROUTINE W REFLEX MICROSCOPIC - Abnormal; Notable for the following components:   APPearance CLOUDY (*)    pH 9.0 (*)    Glucose, UA >=500 (*)    Hgb urine dipstick SMALL (*)    Ketones, ur 5 (*)    Protein, ur 30 (*)    Bacteria, UA RARE (*)    Squamous Epithelial / LPF 0-5 (*)    All other components within normal limits  LIPASE, BLOOD  PREGNANCY, URINE    EKG  EKG Interpretation None       Radiology No results found.  Procedures Procedures (including critical care time)  Medications Ordered in ED Medications  sodium chloride 0.9 % bolus 1,000 mL (0 mLs Intravenous Stopped 01/18/18 1203)  ondansetron (ZOFRAN) injection 4 mg (4 mg Intravenous Given 01/18/18 0806)  potassium chloride SA (K-DUR,KLOR-CON) CR tablet 40 mEq (40 mEq Oral Given 01/18/18 0933)  metoCLOPramide (REGLAN) injection 10 mg (10 mg Intravenous Given 01/18/18 0934)     Initial Impression /  Assessment and Plan / ED Course  I have reviewed the triage vital signs and the nursing notes.  Pertinent labs & imaging results that were available during my care of the patient were reviewed by me and considered in my medical decision making (see chart for details).  Patient presents for evaluation of nausea, vomiting and diarrhea, some associated mild epigastric abdominal cramping.  Patient is overall well-appearing and vitals normal.  Abdomen with minimal epigastric tenderness, no peritoneal signs to suggest acute surgical abdomen.  Lungs clear to auscultation, no URI symptoms.  Will get labs, and give IV fluids and Zofran and reassess.  Laboratory evaluation reassuring no leukocytosis and hemoglobin is stable.  Patient does have hypokalemia of 3.2, will replace with 40 Meqs of p.o. potassium here in the ED, no other electrolyte derangements requiring intervention.  Lipase normal, kidney and liver function normal, UA without evidence of infection.  Initial reevaluation patient reports overall improvement,  still experiencing slight nausea, will give dose of Reglan.  On reevaluation patient reports vast improvement in symptoms.  Patient denies any abdominal pain or cramping.  Reports resolution of nausea, patient has had no further episodes of emesis here in the ED and has tolerated p.o.  Patient has only had one small episode of diarrhea since being in the ED.  On exam abdomen is nontender to palpation.  At this time patient is stable for discharge home, will discharge with Zofran for nausea, as well as 5 days of potassium supplementation.  Have patient follow-up with the [facility].  Strict return precautions discussed with patient she expresses understanding and is in agreement with plan, ample time provided to ask and answer questions.  At this time patient is in no acute distress with normal vitals.  Final Clinical Impressions(s) / ED Diagnoses   Final diagnoses:    Nausea vomiting and diarrhea  Viral illness    ED Discharge Orders        Ordered    ondansetron (ZOFRAN ODT) 4 MG disintegrating tablet     01/18/18 1220    potassium chloride SA (K-DUR,KLOR-CON) 20 MEQ tablet  Daily     01/18/18 1221       Jodi Geralds [place], [place] 01/18/18 1544    Pricilla Loveless, MD 01/18/18 1549

## 2018-01-18 NOTE — ED Triage Notes (Signed)
Pt BIB [facility]. Pt c/o N/V/D and generalized body aches. Onset was 3-4 hours ago. Pt was seen at the ED on 1/22 for similar symptoms. CAOx4.

## 2018-01-18 NOTE — Discharge Instructions (Signed)
Your evaluation today is overall reassuring, labs look good.  You may continue to use Zofran as needed for nausea.  Please take potassium tablet once daily for the next 5 days.  Symptoms are likely caused by a virus or by taking all of your medications at once on an empty stomach as we discussed.  In the future please spread these out and make sure you have eaten a meal first.  Please follow-up with primary care early next week.  If you have worsening abdominal pain, persistent nausea vomiting, blood in the stool, fevers or chills or other new or concerning symptoms please return to the ED.

## 2018-01-18 NOTE — ED Notes (Signed)
Bed: WA09 Expected date:  Expected time:  Means of arrival:  Comments: 27 yr old F vomiting

## 2018-04-17 ENCOUNTER — Encounter (HOSPITAL_BASED_OUTPATIENT_CLINIC_OR_DEPARTMENT_OTHER): Payer: Self-pay

## 2018-04-17 ENCOUNTER — Emergency Department (HOSPITAL_BASED_OUTPATIENT_CLINIC_OR_DEPARTMENT_OTHER)
Admission: EM | Admit: 2018-04-17 | Discharge: 2018-04-17 | Disposition: A | Discharge status: Home / Self Care | Payer: Federal, State, Local not specified - PPO | Attending: Emergency Medicine | Admitting: Emergency Medicine

## 2018-04-17 ENCOUNTER — Other Ambulatory Visit: Payer: Self-pay

## 2018-04-17 ENCOUNTER — Emergency Department (HOSPITAL_BASED_OUTPATIENT_CLINIC_OR_DEPARTMENT_OTHER): Payer: Federal, State, Local not specified - PPO

## 2018-04-17 DIAGNOSIS — J45909 Unspecified asthma, uncomplicated: Secondary | ICD-10-CM | POA: Insufficient documentation

## 2018-04-17 DIAGNOSIS — M25561 Pain in right knee: Secondary | ICD-10-CM | POA: Insufficient documentation

## 2018-04-17 DIAGNOSIS — Z87891 Personal history of nicotine dependence: Secondary | ICD-10-CM | POA: Diagnosis not present

## 2018-04-17 DIAGNOSIS — Z79899 Other long term (current) drug therapy: Secondary | ICD-10-CM | POA: Insufficient documentation

## 2018-04-17 DIAGNOSIS — R519 Headache, unspecified: Secondary | ICD-10-CM

## 2018-04-17 DIAGNOSIS — R51 Headache: Secondary | ICD-10-CM | POA: Diagnosis not present

## 2018-04-17 HISTORY — DX: Bipolar disorder, unspecified: F31.9

## 2018-04-17 LAB — PREGNANCY, URINE: Preg Test, Ur: NEGATIVE

## 2018-04-17 MED ORDER — PROCHLORPERAZINE EDISYLATE 10 MG/2ML IJ SOLN
10.0000 mg | Freq: Once | INTRAMUSCULAR | Status: AC
  Administered 2018-04-17: 10 mg via INTRAVENOUS
  Filled 2018-04-17: qty 2

## 2018-04-17 MED ORDER — SODIUM CHLORIDE 0.9 % IV BOLUS
1000.0000 mL | Freq: Once | INTRAVENOUS | Status: AC
  Administered 2018-04-17: 1000 mL via INTRAVENOUS

## 2018-04-17 MED ORDER — KETOROLAC TROMETHAMINE 15 MG/ML IJ SOLN
15.0000 mg | Freq: Once | INTRAMUSCULAR | Status: AC
  Administered 2018-04-17: 15 mg via INTRAVENOUS
  Filled 2018-04-17: qty 1

## 2018-04-17 MED ORDER — NAPROXEN 500 MG PO TABS: 500.0000 mg | ORAL_TABLET | Freq: Two times a day (BID) | ORAL | 0 refills | Status: DC

## 2018-04-17 MED ORDER — SODIUM CHLORIDE 0.9 % IV SOLN: INTRAVENOUS | Status: DC

## 2018-04-17 NOTE — ED Triage Notes (Signed)
Pt c/o elevated BP x 2 days-states she checked it at [facility] due to HAs-NAD-steady gait

## 2018-04-17 NOTE — Discharge Instructions (Signed)
You were seen in the emergency department for headache, concern regarding blood pressure, and knee pain.  Regarding your Headache:  This improved following medicines in the ER . You blood pressure was somewhat elevated, however this was consistent with previous blood pressures you have had in the ER . It is important that you follow up with your primary care provider to possibly start blood pressure medications. If you do not have a primary care provider call the number circled in your discharge instructions.   Return to the ER for any new or worsening symptoms such as change in vision, numbness, weakness, dizziness, passing out, worsening pain or other concerns.   Regarding your knee pain:  Your x-ray did not show any fracture or dislocation.  Apply ice to the knee 20 minutes on 40 minutes off for the next 24-48 hours to be sure to use a towel to wrap the ice in. You may utilize the knee brace given in the ER for compression and stability.  Be sure to remove this if it is too tight- such as if you start to have tingling or numbness to your feet or they appear pale or blue.   Follow-up with the orthopedic doctor that I have provided in your discharge instructions within 1 week if you have not had improvement in your symptoms.  Return to the emergency department for any new or worsening symptoms including but not limited to fever, chills, numbness, weakness, inability to walk, redness of the knee, increase the pain or swelling.   We have given you Naproxen for pain in your head and knee. I have prescribed you naproxen this is a nonsteroidal anti-inflammatory medication.  You may take this once as needed every 12 hours for pain and swelling, be sure to take this with food as it can cause stomach upset and at worst stomach bleeding.  Do not take other nonsteroidal anti-inflammatories while taking this medicine including Goody powder, Advil, Aleve, or Motrin.  You may supplement with Tylenol.   We have  prescribed you new medication(s) today. Discuss the medications prescribed today with your pharmacist as they can have adverse effects and interactions with your other medicines including over the counter and prescribed medications. Seek medical evaluation if you start to experience new or abnormal symptoms after taking one of these medicines, seek care immediately if you start to experience difficulty breathing, feeling of your throat closing, facial swelling, or rash as these could be indications of a more serious allergic reaction

## 2018-04-17 NOTE — ED Provider Notes (Signed)
[facility] EMERGENCY DEPARTMENT Provider Note   CSN: [id number] Arrival date & time: 04/17/18  1704     History   Chief Complaint Chief Complaint  Patient presents with  . Hypertension  Headache  HPI Jean Mata is a 27 y.o. female with a hx of asthma, bipolar disorder, and depression who presents to the ED with complaint of headache for the past 3 days. Patient states she had gradual onset steady progression headache to the diffuse head. States it started posterior and developed to entire head. States pain is a throbbing/aching sensation that is a 6/10 in severity at present. States she tried tylenol without relief. She has a hx of similar headaches, states that she typically has resolution with  NSAIDs, however she did not have any NSAIDs at home to take. She states because of headaches she checked her BP and it was high, she cannot recall exact number. She states it has been running high at office visits as well lately. Denies change in vision, numbness, weakness, change in speech, confusion, or dizziness. Denies chest pain or dyspnea.  Additionally reports complaint of R knee pain s/p injury this AM. States she was walking and twisted the R knee. Has had constant discomfort since. 6/10. Not improved with tylenol. Worse with weightbearing. Denies numbness/weakness/fever/change in color.    HPI  Past Medical History:  Diagnosis Date  . Asthma   . Bipolar disorder (HCC)   . Depression   . Hydrocephalus   . Obesity     There are no active problems to display for this patient.   Past Surgical History:  Procedure Laterality Date  . BRAIN SURGERY       OB History   None      Home Medications    Prior to Admission medications   Medication Sig Start Date End Date Taking? Authorizing Provider  acetaminophen (TYLENOL) 500 MG tablet Take 1,000 mg by mouth every 6 (six) hours as needed for mild pain, moderate pain, fever or headache.    [provider]    ARIPiprazole (ABILIFY) 15 MG tablet Take 15 mg by mouth daily.    [provider]  levonorgestrel-ethinyl estradiol (LESSINA-28) 0.1-20 MG-MCG tablet Take 1 tablet by mouth daily.    [provider]  ondansetron (ZOFRAN ODT) 4 MG disintegrating tablet  ODT q4 hours prn nausea/vomit 01/18/18   Dartha Lodge, PA-C  potassium chloride SA (K-DUR,KLOR-CON) 20 MEQ tablet Take 1 tablet (20 mEq total) by mouth daily. 01/18/18   Dartha Lodge, PA-C    Family History History reviewed. No pertinent family history.  Social History Social History   Tobacco Use  . Smoking status: Former Games developer  . Smokeless tobacco: Never Used  Substance Use Topics  . Alcohol use: Yes    Comment: occ  . Drug use: Yes    Types: Marijuana     Allergies   Patient has no known allergies.   Review of Systems Review of Systems  Constitutional: Negative for chills and fever.  Eyes: Negative for visual disturbance.  Respiratory: Negative for shortness of breath.   Cardiovascular: Negative for chest pain.  Gastrointestinal: Negative for abdominal pain, diarrhea, nausea and vomiting.  Musculoskeletal: Positive for arthralgias (R knee).  Neurological: Positive for headaches. Negative for dizziness, weakness and numbness.  All other systems reviewed and are negative.  Physical Exam Updated Vital Signs BP (!) 144/87 (BP Location: Left Arm)   Pulse 95   Temp 98.3 F (36.8 C) (Oral)  Resp 18   Ht 6' (1.829 m)   Wt 123.4 kg (272 lb)   LMP 03/27/2018   SpO2 100%   BMI 36.89 kg/m   Physical Exam  Constitutional: She appears well-developed and well-nourished.  Non-toxic appearance. No distress.  HENT:  Head: Normocephalic and atraumatic.  Eyes: Pupils are equal, round, and reactive to light. Conjunctivae and EOM are normal. Right eye exhibits no discharge. Left eye exhibits no discharge.  Neck: Normal range of motion. Neck supple. No neck rigidity.  Cardiovascular: Normal rate and  regular rhythm.  No murmur heard. Pulses:      Dorsalis pedis pulses are 2+ on the right side, and 2+ on the left side.  Pulmonary/Chest: Breath sounds normal. No respiratory distress. She has no wheezes. She has no rales.  Abdominal: Soft. She exhibits no distension. There is no tenderness.  Musculoskeletal:  No obvious deformities, appreciable swelling, erythema, or ecchymosis. No open wounds.  LEs: Normal ROM to bilateral hips and ankles, some limitation in R knee flexion- able to flex to 90 degrees. Diffusely tender to anterior R knee, no focal/point tenderness.    Neurological: She is alert.  Clear speech. CN III-XII grossly intact. Normal finger to nose. 5/5 symmetric grip strength. 5/5 strength with plantar/dorsiflexion. Steady gait.   Skin: Skin is warm and dry. No rash noted.  Psychiatric: She has a normal mood and affect. Her behavior is normal.  Nursing note and vitals reviewed.    ED Treatments / Results  Labs (all labs ordered are listed, but only abnormal results are displayed) Labs Reviewed - No data to display  EKG None  Radiology Dg Knee Complete 4 Views Right  Result Date: 04/17/2018 CLINICAL DATA:  26 year old female with a history of right knee injury EXAM: RIGHT KNEE - COMPLETE 4+ VIEW COMPARISON:  None. FINDINGS: No evidence of fracture, dislocation, or joint effusion. No evidence of arthropathy or other focal bone abnormality. Soft tissues are unremarkable. IMPRESSION: Negative for acute bony abnormality Electronically Signed   By: Gilmer Mor D.O.   On: 04/17/2018 19:12    Procedures Procedures (including critical care time)  Medications Ordered in ED Medications  sodium chloride 0.9 % bolus 1,000 mL (0 mLs Intravenous Stopped 04/17/18 2040)  ketorolac (TORADOL) 15 MG/ML injection 15 mg (15 mg Intravenous Given 04/17/18 1928)  prochlorperazine (COMPAZINE) injection 10 mg (10 mg Intravenous Given 04/17/18 1928)     Initial Impression / Assessment and Plan /  ED Course  I have reviewed the triage vital signs and the nursing notes.  Pertinent labs & imaging results that were available during my care of the patient were reviewed by me and considered in my medical decision making (see chart for details).   Patient presents with headache, concern regarding blood pressure, and R knee pain. Patient is nontoxic appearing, initial blood pressure somewhat elevated at 144/87- however upon review this is consistent with patient's blood pressure recordings during previous visits, do not suspect HTN emergency at this time- she is without chest pain/dyspnea/dizziness/numbness/weakness. She complains of headache- she has a hx of similar headaches, gradual onset with steady progression in severity, I offered head CT to patient to which she declined- I am in agreement with this given her presentation- non concerning for [facility], ICH, ischemic CVA, dural venous sinus thrombosis, acute glaucoma, giant cell arteritis, mass, or meningitis at this time. Pt is afebrile with no focal neuro deficits, dizziness, change in vision, or nuchal rigidity. Regarding her knee pain after twisting type injury-  Some limitation in ROM, able to flex to 90 degrees, diffuse anterior tenderness, non focal, NVI distally. X-ray negative for fracture/dislocation. No overlying swelling, erythema, warmth, or fever- low suspicion for infectious etiology. Will provide knee sleeve.   Patient treated for headache and knee pain in the ED with resolution of her discomfort. Will DC home with prescription for Naproxen. Discussed that given her blood pressure is fairly consistently classifiable as hypertensive she will need to discuss possiblity of initiating antihypertensive medications with her PCP. I discussed treatment plan, need for PCP and sports medicine follow-up, and return precautions with the patient. Provided opportunity for questions, patient confirmed understanding and is in agreement with plan.    Final  Clinical Impressions(s) / ED Diagnoses   Final diagnoses:  Acute nonintractable headache, unspecified headache type  Acute pain of right knee    ED Discharge Orders        Ordered    naproxen (NAPROSYN) 500 MG tablet  2 times daily     04/17/18 2028       Syncere Kaminski, Pleas Koch, PA-C 04/18/18 0004    Pricilla Loveless, MD 04/18/18 [phone]

## 2018-09-24 ENCOUNTER — Other Ambulatory Visit: Payer: Self-pay

## 2018-09-24 ENCOUNTER — Encounter (HOSPITAL_BASED_OUTPATIENT_CLINIC_OR_DEPARTMENT_OTHER): Payer: Self-pay | Admitting: *Deleted

## 2018-09-24 ENCOUNTER — Emergency Department (HOSPITAL_BASED_OUTPATIENT_CLINIC_OR_DEPARTMENT_OTHER)
Admission: EM | Admit: 2018-09-24 | Discharge: 2018-09-24 | Disposition: A | Discharge status: Home / Self Care | Payer: Federal, State, Local not specified - PPO | Attending: Emergency Medicine | Admitting: Emergency Medicine

## 2018-09-24 DIAGNOSIS — S60512A Abrasion of left hand, initial encounter: Secondary | ICD-10-CM | POA: Insufficient documentation

## 2018-09-24 DIAGNOSIS — Y999 Unspecified external cause status: Secondary | ICD-10-CM | POA: Insufficient documentation

## 2018-09-24 DIAGNOSIS — W5501XA Bitten by cat, initial encounter: Secondary | ICD-10-CM | POA: Insufficient documentation

## 2018-09-24 DIAGNOSIS — Y929 Unspecified place or not applicable: Secondary | ICD-10-CM | POA: Insufficient documentation

## 2018-09-24 DIAGNOSIS — S60511A Abrasion of right hand, initial encounter: Secondary | ICD-10-CM | POA: Insufficient documentation

## 2018-09-24 DIAGNOSIS — Y93K9 Activity, other involving animal care: Secondary | ICD-10-CM | POA: Insufficient documentation

## 2018-09-24 DIAGNOSIS — S61459A Open bite of unspecified hand, initial encounter: Secondary | ICD-10-CM

## 2018-09-24 LAB — PREGNANCY, URINE: Preg Test, Ur: NEGATIVE

## 2018-09-24 MED ORDER — BACITRACIN ZINC 500 UNIT/GM EX OINT: TOPICAL_OINTMENT | Freq: Two times a day (BID) | CUTANEOUS | Status: DC

## 2018-09-24 MED ORDER — AMOXICILLIN-POT CLAVULANATE 875-125 MG PO TABS: 1.0000 | ORAL_TABLET | Freq: Two times a day (BID) | ORAL | 0 refills | Status: AC

## 2018-09-24 MED ORDER — AMOXICILLIN-POT CLAVULANATE 875-125 MG PO TABS
1.0000 | ORAL_TABLET | Freq: Once | ORAL | Status: AC
  Administered 2018-09-24: 1 via ORAL
  Filled 2018-09-24: qty 1

## 2018-09-24 NOTE — Discharge Instructions (Signed)
Wound recheck in 2 days, referral given for PCP if you do not have one.  Return to ER for any worsening or concerning symptoms. Take Augmentin as prescribed and complete the full course. Animal control will follow up to verify vaccine status. Rabies is a fatal illness. Contact your doctor in [place] to verify your last tetanus vaccine (should be within the last 5 years).

## 2018-09-24 NOTE — ED Provider Notes (Signed)
[facility] EMERGENCY DEPARTMENT Provider Note   CSN: [id number] Arrival date & time: 09/24/18  2145     History   Chief Complaint Chief Complaint  Patient presents with  . Animal Bite    HPI Jean Mata is a 27 y.o. female.  27yo female presents with complaint of cat scratches to both hands. Patient states her cat got out and when she was picking it up out of the bushes the cat scratched/bit her. Patient states her last tetanus vaccine was in 2013, states the cat is up to date on its vaccines. Patient had originally reported the cat to be her neighbor's cat until I discussed needing to locate and quarantine the cat vs initiate the rabies vaccine series. Patient plans to call her PCP and verify her last td, does not want any shots tonight.     Past Medical History:  Diagnosis Date  . Asthma   . Bipolar disorder (HCC)   . Depression   . Hydrocephalus (HCC)   . Obesity     There are no active problems to display for this patient.   Past Surgical History:  Procedure Laterality Date  . BRAIN SURGERY       OB History   None      Home Medications    Prior to Admission medications   Medication Sig Start Date End Date Taking? Authorizing Provider  acetaminophen (TYLENOL) 500 MG tablet Take 1,000 mg by mouth every 6 (six) hours as needed for mild pain, moderate pain, fever or headache.    [provider]  amoxicillin-clavulanate (AUGMENTIN) 875-125 MG tablet Take 1 tablet by mouth every 12 (twelve) hours for 7 days. 09/24/18 10/01/18  Jeannie Fend, PA-C  ARIPiprazole (ABILIFY) 15 MG tablet Take 15 mg by mouth daily.    [provider]  levonorgestrel-ethinyl estradiol (LESSINA-28) 0.1-20 MG-MCG tablet Take 1 tablet by mouth daily.    [provider]  naproxen (NAPROSYN) 500 MG tablet Take 1 tablet (500 mg total) by mouth 2 (two) times daily. 04/17/18   Petrucelli, Samantha R, PA-C  ondansetron (ZOFRAN ODT) 4 MG disintegrating  tablet 4mg  ODT q4 hours prn nausea/vomit 01/18/18   Dartha Lodge, PA-C  potassium chloride SA (K-DUR,KLOR-CON) 20 MEQ tablet Take 1 tablet (20 mEq total) by mouth daily. 01/18/18   Dartha Lodge, PA-C    Family History No family history on file.  Social History Social History   Tobacco Use  . Smoking status: Former Games developer  . Smokeless tobacco: Never Used  Substance Use Topics  . Alcohol use: Yes    Comment: occ  . Drug use: Yes    Types: Marijuana     Allergies   Patient has no known allergies.   Review of Systems Review of Systems  Constitutional: Negative for chills and fever.  Musculoskeletal: Negative for arthralgias, joint swelling and myalgias.  Skin: Positive for wound.  Allergic/Immunologic: Negative for immunocompromised state.  All other systems reviewed and are negative.    Physical Exam Updated Vital Signs BP 130/87   Pulse 93   Temp 97.8 F (36.6 C) (Oral)   Resp 18   Ht 6' (1.829 m)   Wt 124.7 kg   LMP 09/04/2018   SpO2 100%   BMI 37.30 kg/m   Physical Exam  Constitutional: She is oriented to person, place, and time. She appears well-developed and well-nourished. No distress.  HENT:  Head: Normocephalic and atraumatic.  Cardiovascular: Intact distal pulses.  Pulmonary/Chest: Effort normal.  Musculoskeletal:  Multiple superficial abrasions/wounds to bilateral hands, no puncture wounds  Neurological: She is alert and oriented to person, place, and time. No sensory deficit.  Skin: Skin is warm and dry. She is not diaphoretic.  Psychiatric: She has a normal mood and affect. Her behavior is normal.  Nursing note and vitals reviewed.    ED Treatments / Results  Labs (all labs ordered are listed, but only abnormal results are displayed) Labs Reviewed  PREGNANCY, URINE    EKG None  Radiology No results found.  Procedures Procedures (including critical care time)  Medications Ordered in ED Medications  bacitracin ointment (has no  administration in time range)  amoxicillin-clavulanate (AUGMENTIN) 875-125 MG per tablet 1 tablet (has no administration in time range)     Initial Impression / Assessment and Plan / ED Course  I have reviewed the triage vital signs and the nursing notes.  Pertinent labs & imaging results that were available during my care of the patient were reviewed by me and considered in my medical decision making (see chart for details).  Clinical Course as of Sep 25 2231  Tue Sep 24, 2018  3186 27 year old female with cat bites to both hands.  Patient states this is her cat and vaccines are up-to-date.  Report has been made to animal control.  Wounds were cleaned and dressed, patient started Augmentin.  Discussed risk of rabies and importance of follow-up with animal control.  Return to ER for worsening or concerning symptoms, recommend wound recheck with PCP in 2 days.   [LM]    Clinical Course User Index [LM] Jeannie Fend, PA-C   Final Clinical Impressions(s) / ED Diagnoses   Final diagnoses:  Cat bite of hand, unspecified laterality, initial encounter    ED Discharge Orders         Ordered    amoxicillin-clavulanate (AUGMENTIN) 875-125 MG tablet  Every 12 hours     09/24/18 2228           Jeannie Fend, PA-C 09/24/18 2232    Melene Plan, DO 09/24/18 2329

## 2018-09-24 NOTE — ED Triage Notes (Signed)
Stray cat scratched and bit her on her hands and wrist tonight.

## 2018-11-07 ENCOUNTER — Encounter (HOSPITAL_BASED_OUTPATIENT_CLINIC_OR_DEPARTMENT_OTHER): Payer: Self-pay

## 2018-11-07 ENCOUNTER — Other Ambulatory Visit: Payer: Self-pay

## 2018-11-07 ENCOUNTER — Emergency Department (HOSPITAL_BASED_OUTPATIENT_CLINIC_OR_DEPARTMENT_OTHER)
Admission: EM | Admit: 2018-11-07 | Discharge: 2018-11-07 | Discharge status: Against Medical Advice | Payer: Self-pay | Attending: Emergency Medicine | Admitting: Emergency Medicine

## 2018-11-07 DIAGNOSIS — Z5321 Procedure and treatment not carried out due to patient leaving prior to being seen by health care provider: Secondary | ICD-10-CM | POA: Insufficient documentation

## 2018-11-07 DIAGNOSIS — R1110 Vomiting, unspecified: Secondary | ICD-10-CM | POA: Insufficient documentation

## 2018-11-07 NOTE — ED Triage Notes (Addendum)
Pt c/o n/v/d x 10 days-sx started after being dx with trichomonas-unable to complete abx due to n/v-NAD-steady gait

## 2018-11-09 ENCOUNTER — Emergency Department (HOSPITAL_COMMUNITY): Payer: Self-pay

## 2018-11-09 ENCOUNTER — Emergency Department (HOSPITAL_COMMUNITY)
Admission: EM | Admit: 2018-11-09 | Discharge: 2018-11-09 | Disposition: A | Discharge status: Home / Self Care | Payer: Self-pay | Attending: Emergency Medicine | Admitting: Emergency Medicine

## 2018-11-09 ENCOUNTER — Encounter (HOSPITAL_COMMUNITY): Payer: Self-pay | Admitting: Emergency Medicine

## 2018-11-09 DIAGNOSIS — R197 Diarrhea, unspecified: Secondary | ICD-10-CM | POA: Insufficient documentation

## 2018-11-09 DIAGNOSIS — Z87891 Personal history of nicotine dependence: Secondary | ICD-10-CM | POA: Insufficient documentation

## 2018-11-09 DIAGNOSIS — R112 Nausea with vomiting, unspecified: Secondary | ICD-10-CM

## 2018-11-09 DIAGNOSIS — Z209 Contact with and (suspected) exposure to unspecified communicable disease: Secondary | ICD-10-CM | POA: Insufficient documentation

## 2018-11-09 DIAGNOSIS — R1030 Lower abdominal pain, unspecified: Secondary | ICD-10-CM | POA: Insufficient documentation

## 2018-11-09 DIAGNOSIS — O219 Vomiting of pregnancy, unspecified: Secondary | ICD-10-CM | POA: Insufficient documentation

## 2018-11-09 DIAGNOSIS — Z79899 Other long term (current) drug therapy: Secondary | ICD-10-CM | POA: Insufficient documentation

## 2018-11-09 DIAGNOSIS — O26891 Other specified pregnancy related conditions, first trimester: Secondary | ICD-10-CM | POA: Insufficient documentation

## 2018-11-09 DIAGNOSIS — E259 Adrenogenital disorder, unspecified: Secondary | ICD-10-CM

## 2018-11-09 DIAGNOSIS — Z3A08 8 weeks gestation of pregnancy: Secondary | ICD-10-CM | POA: Insufficient documentation

## 2018-11-09 DIAGNOSIS — Z3201 Encounter for pregnancy test, result positive: Secondary | ICD-10-CM | POA: Insufficient documentation

## 2018-11-09 HISTORY — DX: Trichomoniasis, unspecified: A59.9

## 2018-11-09 LAB — COMPREHENSIVE METABOLIC PANEL
ALK PHOS: 67 U/L (ref 38–126)
ALT: 38 U/L (ref 0–44)
AST: 25 U/L (ref 15–41)
Albumin: 3.4 g/dL — ABNORMAL LOW (ref 3.5–5.0)
Anion gap: 7 (ref 5–15)
BILIRUBIN TOTAL: 0.3 mg/dL (ref 0.3–1.2)
BUN: 7 mg/dL (ref 6–20)
CALCIUM: 9.1 mg/dL (ref 8.9–10.3)
CO2: 24 mmol/L (ref 22–32)
CREATININE: 0.74 mg/dL (ref 0.44–1.00)
Chloride: 105 mmol/L (ref 98–111)
GFR calc Af Amer: >60 mL/min (ref >60)
GLUCOSE: 79 mg/dL (ref 70–99)
POTASSIUM: 3.5 mmol/L (ref 3.5–5.1)
Sodium: 136 mmol/L (ref 135–145)
TOTAL PROTEIN: 7.3 g/dL (ref 6.5–8.1)

## 2018-11-09 LAB — URINALYSIS, ROUTINE W REFLEX MICROSCOPIC
Bilirubin Urine: NEGATIVE
Glucose, UA: NEGATIVE mg/dL
Ketones, ur: NEGATIVE mg/dL
NITRITE: NEGATIVE
PH: 8 (ref 5.0–8.0)
PROTEIN: 30 mg/dL — AB
SPECIFIC GRAVITY, URINE: 1.026 (ref 1.005–1.030)

## 2018-11-09 LAB — CBC
HCT: 38.9 % (ref 36.0–46.0)
Hemoglobin: 12.6 g/dL (ref 12.0–15.0)
MCH: 29.1 pg (ref 26.0–34.0)
MCHC: 32.4 g/dL (ref 30.0–36.0)
MCV: 89.8 fL (ref 80.0–100.0)
PLATELETS: 366 10*3/uL (ref 150–400)
RBC: 4.33 MIL/uL (ref 3.87–5.11)
RDW: 13.2 % (ref 11.5–15.5)
WBC: 14.4 10*3/uL — AB (ref 4.0–10.5)
nRBC: 0 % (ref 0.0–0.2)

## 2018-11-09 LAB — I-STAT BETA HCG BLOOD, ED (MC, WL, AP ONLY): I-stat hCG, quantitative: >2000 m[IU]/mL — ABNORMAL HIGH (ref <5)

## 2018-11-09 LAB — HCG, QUANTITATIVE, PREGNANCY: hCG, Beta Chain, Quant, S: 83845 m[IU]/mL — ABNORMAL HIGH (ref <5)

## 2018-11-09 LAB — LIPASE, BLOOD: Lipase: 33 U/L (ref 11–51)

## 2018-11-09 MED ORDER — METOCLOPRAMIDE HCL 5 MG/ML IJ SOLN
10.0000 mg | Freq: Once | INTRAMUSCULAR | Status: AC
  Administered 2018-11-09: 10 mg via INTRAVENOUS
  Filled 2018-11-09: qty 2

## 2018-11-09 MED ORDER — SODIUM CHLORIDE 0.9 % IV BOLUS (SEPSIS)
1000.0000 mL | Freq: Once | INTRAVENOUS | Status: AC
  Administered 2018-11-09: 1000 mL via INTRAVENOUS

## 2018-11-09 MED ORDER — ALUM & MAG HYDROXIDE-SIMETH 200-200-20 MG/5ML PO SUSP
30.0000 mL | Freq: Once | ORAL | Status: AC
  Administered 2018-11-09: 30 mL via ORAL
  Filled 2018-11-09: qty 30

## 2018-11-09 MED ORDER — METOCLOPRAMIDE HCL 10 MG PO TABS: 10.0000 mg | ORAL_TABLET | Freq: Four times a day (QID) | ORAL | 0 refills | Status: DC | PRN

## 2018-11-09 MED ORDER — LIDOCAINE VISCOUS HCL 2 % MT SOLN
15.0000 mL | Freq: Once | OROMUCOSAL | Status: AC
  Administered 2018-11-09: 15 mL via ORAL
  Filled 2018-11-09: qty 15

## 2018-11-09 NOTE — ED Notes (Signed)
Patient verbalizes understanding of discharge instructions. Opportunity for questioning and answers were provided. Armband removed by staff, pt discharged from ED.  

## 2018-11-09 NOTE — ED Notes (Signed)
Pt returned from U/S, pt states she is unable to give urine sample at this time, pt states she emptied bladder prior to u/s but did not collect sample.  IVF infusing. Pt provided sprite for fluid challenge.

## 2018-11-09 NOTE — ED Triage Notes (Signed)
Pt c/o n/v x 15-16 days. Pt states s/s started at the same time dx with trich. Pt reports she was unable to start antbx until 2 days ago.  Pt reports emesis x 6-7 in last 24 hours.

## 2018-11-09 NOTE — ED Provider Notes (Signed)
TIME SEEN: 2:46 AM  CHIEF COMPLAINT: Nausea, vomiting, diarrhea  HPI: Patient is a 27 year old female who presents to the emergency department with 2 weeks of intermittent nausea, vomiting and diarrhea.  Having some epigastric discomfort and pain in the sides of her lower abdomen which she states she thinks is from vomiting so much.  She reports she has had sick contact of someone at work with diarrhea but no recent travel or antibiotic use other than Flagyl for trichomonas.  Has had some dysuria.  No previous abdominal surgeries.  No fever.  No vaginal bleeding, discharge, hematuria.  ROS: See HPI Constitutional: no fever  Eyes: no drainage  ENT: no runny nose   Cardiovascular:  no chest pain  Resp: no SOB  GI: Diarrhea and vomiting GU:  dysuria Integumentary: no rash  Allergy: no hives  Musculoskeletal: no leg swelling  Neurological: no slurred speech ROS otherwise negative  PAST MEDICAL HISTORY/PAST SURGICAL HISTORY:  Past Medical History:  Diagnosis Date  . Asthma   . Bipolar disorder (HCC)   . Depression   . Hydrocephalus (HCC)   . Obesity   . Trichimoniasis     MEDICATIONS:  Prior to Admission medications   Medication Sig Start Date End Date Taking? Authorizing Provider  acetaminophen (TYLENOL) 500 MG tablet Take 1,000 mg by mouth every 6 (six) hours as needed for mild pain, moderate pain, fever or headache.    [provider]  ARIPiprazole (ABILIFY) 15 MG tablet Take 15 mg by mouth daily.    [provider]  levonorgestrel-ethinyl estradiol (LESSINA-28) 0.1-20 MG-MCG tablet Take 1 tablet by mouth daily.    [provider]  naproxen (NAPROSYN) 500 MG tablet Take 1 tablet (500 mg total) by mouth 2 (two) times daily. 04/17/18   Petrucelli, Samantha R, PA-C  ondansetron (ZOFRAN ODT) 4 MG disintegrating tablet 4mg  ODT q4 hours prn nausea/vomit 01/18/18   Dartha Lodge, PA-C  potassium chloride SA (K-DUR,KLOR-CON) 20 MEQ tablet Take 1 tablet (20 mEq  total) by mouth daily. 01/18/18   Dartha Lodge, PA-C    ALLERGIES:  No Known Allergies  SOCIAL HISTORY:  Social History   Tobacco Use  . Smoking status: Former Games developer  . Smokeless tobacco: Never Used  Substance Use Topics  . Alcohol use: Yes    Comment: occ    FAMILY HISTORY: No family history on file.  EXAM: BP 121/76 (BP Location: Right Arm)   Pulse 99   Temp 98.8 F (37.1 C) (Oral)   Resp 16   Ht 6\' 1"  (1.854 m)   Wt 111.5 kg   SpO2 100%   BMI 32.43 kg/m  CONSTITUTIONAL: Alert and oriented and responds appropriately to questions. Well-appearing; well-nourished HEAD: Normocephalic EYES: Conjunctivae clear, pupils appear equal, EOMI ENT: normal nose; moist mucous membranes NECK: Supple, no meningismus, no nuchal rigidity, no LAD  CARD: RRR; S1 and S2 appreciated; no murmurs, no clicks, no rubs, no gallops RESP: Normal chest excursion without splinting or tachypnea; breath sounds clear and equal bilaterally; no wheezes, no rhonchi, no rales, no hypoxia or respiratory distress, speaking full sentences ABD/GI: Normal bowel sounds; non-distended; soft, minimally tender in the epigastric region, no rebound, no guarding, no peritoneal signs, no hepatosplenomegaly, no tenderness at McBurney's point BACK:  The back appears normal and is non-tender to palpation, there is no CVA tenderness EXT: Normal ROM in all joints; non-tender to palpation; no edema; normal capillary refill; no cyanosis, no calf tenderness or swelling    SKIN:  Normal color for age and race; warm; no rash NEURO: Moves all extremities equally PSYCH: The patient's mood and manner are appropriate. Grooming and personal hygiene are appropriate.  MEDICAL DECISION MAKING: Patient here with intermittent nausea, vomiting and diarrhea.  Abdominal exam is relatively benign.  Pregnancy test found to be positive.  She is not sure of her last menstrual.  She states she takes her birth control pills each month consecutively  and never takes a break.  She has never been pregnant before.  No GU symptoms other than some dysuria.  Labs otherwise unremarkable other than mild leukocytosis.  Will obtain transvaginal ultrasound given she is complaining of some lower abdominal pain.  Will treat symptoms with IV fluids, Reglan, GI cocktail.  ED PROGRESS: Patient reports feeling better after Reglan.  Beta hCG is over 80,000.  Urine shows no sign of infection.  She does have protein but this appears to be something that she has had previously.  She has a mildly elevated blood pressure.  She states this is not a pregnancy that she plans to keep.  I have given her outpatient OB as well as [facility] follow[facility]up.  Have advised her to start prenatal vitamins if she changes her mind.  Ultrasound shows single intrauterine pregnancy with crown-rump length consistent with a pregnancy that is 8 weeks and 2 days along without sign of complication.  Heart rate of 162 bpm.  Discussed return precautions.  Patient comfortable with this plan.  Recommended over-the-counter Tylenol for pain, Imodium for diarrhea.   At this time, I do not feel there is any life-threatening condition present. I have reviewed and discussed all results (EKG, imaging, lab, urine as appropriate) and exam findings with patient/family. I have reviewed nursing notes and appropriate previous records.  I feel the patient is safe to be discharged home without further emergent workup and can continue workup as an outpatient as needed. Discussed usual and customary return precautions. Patient/family verbalize understanding and are comfortable with this plan.  Outpatient follow-up has been provided if needed. All questions have been answered.      Derricka Mertz, Layla MawKristen N, DO 11/09/18 [phone]

## 2018-11-09 NOTE — ED Notes (Signed)
Pt to us via stretcher

## 2018-11-09 NOTE — ED Notes (Signed)
Dr. Ward at bedside.

## 2018-11-09 NOTE — Discharge Instructions (Addendum)
I recommend that you stop using marijuana.  You are found to be pregnant today and your ultrasound shows that you are approximately 8 weeks, 2 days pregnant.  I recommend that you start taking prenatal vitamins daily if this is a pregnancy you are interested in keeping.  You may use the prescription for Reglan as needed for nausea and vomiting.  You may use over-the-counter Imodium as needed for diarrhea.  You may use over-the-counter Tylenol as needed for pain.   [facility] [address] [place], [place] [phone]  [facility] [address] [place], [place] [phone]   [facility] [address] [place], [place] [phone]) [phone]  [facility] [address] [place], [place] [phone]   [facility] [address] [place], [place] [phone]) [phone]   [facility] [address] [place], [place] [phone]   [facility] [address] [place], [place] [phone]   [facility] [address] [place], [place] [phone]

## 2018-11-10 LAB — URINE CULTURE: Culture: >=100000 — AB

## 2018-11-18 ENCOUNTER — Emergency Department (HOSPITAL_COMMUNITY)
Admission: EM | Admit: 2018-11-18 | Discharge: 2018-11-19 | Disposition: A | Discharge status: Home / Self Care | Payer: Medicaid Other | Attending: Emergency Medicine | Admitting: Emergency Medicine

## 2018-11-18 ENCOUNTER — Encounter (HOSPITAL_COMMUNITY): Payer: Self-pay | Admitting: *Deleted

## 2018-11-18 ENCOUNTER — Other Ambulatory Visit: Payer: Self-pay

## 2018-11-18 DIAGNOSIS — Z5321 Procedure and treatment not carried out due to patient leaving prior to being seen by health care provider: Secondary | ICD-10-CM | POA: Insufficient documentation

## 2018-11-18 DIAGNOSIS — R1110 Vomiting, unspecified: Secondary | ICD-10-CM | POA: Insufficient documentation

## 2018-11-18 LAB — URINALYSIS, ROUTINE W REFLEX MICROSCOPIC
BACTERIA UA: NONE SEEN
Bilirubin Urine: NEGATIVE
Glucose, UA: 150 mg/dL — AB
Ketones, ur: 80 mg/dL — AB
Nitrite: NEGATIVE
Protein, ur: 100 mg/dL — AB
Specific Gravity, Urine: 1.03 (ref 1.005–1.030)
WBC, UA: >50 WBC/hpf — ABNORMAL HIGH (ref 0–5)
pH: 6 (ref 5.0–8.0)

## 2018-11-18 LAB — CBC
HCT: 41.6 % (ref 36.0–46.0)
Hemoglobin: 13.1 g/dL (ref 12.0–15.0)
MCH: 27.9 pg (ref 26.0–34.0)
MCHC: 31.5 g/dL (ref 30.0–36.0)
MCV: 88.7 fL (ref 80.0–100.0)
Platelets: 425 10*3/uL — ABNORMAL HIGH (ref 150–400)
RBC: 4.69 MIL/uL (ref 3.87–5.11)
RDW: 13 % (ref 11.5–15.5)
WBC: 13.3 10*3/uL — ABNORMAL HIGH (ref 4.0–10.5)
nRBC: 0 % (ref 0.0–0.2)

## 2018-11-18 LAB — COMPREHENSIVE METABOLIC PANEL
ALBUMIN: 3.9 g/dL (ref 3.5–5.0)
ALT: 17 U/L (ref 0–44)
AST: 17 U/L (ref 15–41)
Alkaline Phosphatase: 60 U/L (ref 38–126)
Anion gap: 10 (ref 5–15)
BUN: 5 mg/dL — AB (ref 6–20)
CO2: 22 mmol/L (ref 22–32)
CREATININE: 0.66 mg/dL (ref 0.44–1.00)
Calcium: 9.5 mg/dL (ref 8.9–10.3)
Chloride: 102 mmol/L (ref 98–111)
GFR calc Af Amer: >60 mL/min (ref >60)
GFR calc non Af Amer: >60 mL/min (ref >60)
GLUCOSE: 106 mg/dL — AB (ref 70–99)
Potassium: 3 mmol/L — ABNORMAL LOW (ref 3.5–5.1)
Sodium: 134 mmol/L — ABNORMAL LOW (ref 135–145)
Total Bilirubin: 0.7 mg/dL (ref 0.3–1.2)
Total Protein: 8.1 g/dL (ref 6.5–8.1)

## 2018-11-18 LAB — I-STAT BETA HCG BLOOD, ED (MC, WL, AP ONLY)

## 2018-11-18 LAB — HCG, QUANTITATIVE, PREGNANCY: hCG, Beta Chain, Quant, S: 107632 m[IU]/mL — ABNORMAL HIGH (ref <5)

## 2018-11-18 LAB — LIPASE, BLOOD: Lipase: 27 U/L (ref 11–51)

## 2018-11-18 MED ORDER — ACETAMINOPHEN 325 MG PO TABS
650.0000 mg | ORAL_TABLET | Freq: Once | ORAL | Status: AC
  Administered 2018-11-18: 650 mg via ORAL

## 2018-11-18 MED ORDER — ACETAMINOPHEN 325 MG PO TABS
325.0000 mg | ORAL_TABLET | Freq: Once | ORAL | Status: DC
  Filled 2018-11-18: qty 1

## 2018-11-18 MED ORDER — ONDANSETRON 4 MG PO TBDP
4.0000 mg | ORAL_TABLET | Freq: Once | ORAL | Status: AC
  Administered 2018-11-18: 4 mg via ORAL
  Filled 2018-11-18: qty 1

## 2018-11-18 NOTE — ED Triage Notes (Signed)
Pt reports she is [redacted] weeks pregnant and 2-3 weeks she has had vomiting. She was given meds for nausea but cannot take it because she has to eat. No vaginal bleeding, umbilical pain, and no change in vaginal d\/c.

## 2018-11-19 ENCOUNTER — Other Ambulatory Visit: Payer: Self-pay

## 2018-11-19 ENCOUNTER — Emergency Department (HOSPITAL_COMMUNITY)
Admission: EM | Admit: 2018-11-19 | Discharge: 2018-11-19 | Disposition: A | Discharge status: Home / Self Care | Payer: Medicaid Other | Attending: Emergency Medicine | Admitting: Emergency Medicine

## 2018-11-19 ENCOUNTER — Encounter (HOSPITAL_COMMUNITY): Payer: Self-pay | Admitting: Emergency Medicine

## 2018-11-19 DIAGNOSIS — Z87891 Personal history of nicotine dependence: Secondary | ICD-10-CM | POA: Insufficient documentation

## 2018-11-19 DIAGNOSIS — Z79899 Other long term (current) drug therapy: Secondary | ICD-10-CM | POA: Insufficient documentation

## 2018-11-19 DIAGNOSIS — N3090 Cystitis, unspecified without hematuria: Secondary | ICD-10-CM | POA: Insufficient documentation

## 2018-11-19 DIAGNOSIS — Z3A09 9 weeks gestation of pregnancy: Secondary | ICD-10-CM

## 2018-11-19 DIAGNOSIS — O219 Vomiting of pregnancy, unspecified: Secondary | ICD-10-CM | POA: Insufficient documentation

## 2018-11-19 DIAGNOSIS — O99511 Diseases of the respiratory system complicating pregnancy, first trimester: Secondary | ICD-10-CM | POA: Insufficient documentation

## 2018-11-19 MED ORDER — SODIUM CHLORIDE 0.9 % IV SOLN
1.0000 g | Freq: Once | INTRAVENOUS | Status: AC
  Administered 2018-11-19: 1 g via INTRAVENOUS
  Filled 2018-11-19: qty 10

## 2018-11-19 MED ORDER — SODIUM CHLORIDE 0.9 % IV BOLUS
1000.0000 mL | Freq: Once | INTRAVENOUS | Status: AC
  Administered 2018-11-19: 1000 mL via INTRAVENOUS

## 2018-11-19 MED ORDER — CEPHALEXIN 500 MG PO CAPS: 500.0000 mg | ORAL_CAPSULE | Freq: Two times a day (BID) | ORAL | 0 refills | Status: AC

## 2018-11-19 MED ORDER — METOCLOPRAMIDE HCL 10 MG PO TABS: 10.0000 mg | ORAL_TABLET | Freq: Four times a day (QID) | ORAL | 0 refills | Status: DC | PRN

## 2018-11-19 MED ORDER — METOCLOPRAMIDE HCL 5 MG/ML IJ SOLN
10.0000 mg | Freq: Once | INTRAMUSCULAR | Status: AC
  Administered 2018-11-19: 10 mg via INTRAVENOUS
  Filled 2018-11-19: qty 2

## 2018-11-19 MED ORDER — DIPHENHYDRAMINE HCL 50 MG/ML IJ SOLN
25.0000 mg | Freq: Once | INTRAMUSCULAR | Status: AC
  Administered 2018-11-19: 25 mg via INTRAVENOUS
  Filled 2018-11-19: qty 1

## 2018-11-19 NOTE — ED Triage Notes (Signed)
Per EMS, pt from home. Pt reports cp/abd pain for last 3 hours. Reports squeezing sensation, [redacted] wks pregnant. Reports unable to keep anything down, multiple episodes of vomiting. N/V for a week. No prenatal care yet, Pt is [redacted] weeks pregnant.   EMS VS BP 158/106, hx of htn. 99% room air. CBG 136.

## 2018-11-19 NOTE — ED Notes (Addendum)
Pt reports she was here two hours ago for the same thing but then left because it was taking too long. Labs already resulted from previous check in.

## 2018-11-19 NOTE — Discharge Instructions (Signed)
Please take antibiotics as prescribed for UTI Please see attached handout Take Reglan as directed for nausea and vomiting I would recommend stopping marijuana use and starting a prenatal vitamin If you develop worsening or new concerning symptoms you can return to the emergency department for re-evaluation.

## 2018-11-19 NOTE — ED Notes (Signed)
Pt left lobby without informing staff; came back by EMS after women's diverted pt due to chest pain

## 2018-11-19 NOTE — ED Provider Notes (Signed)
[facility] EMERGENCY DEPARTMENT Provider Note   CSN: [id number] Arrival date & time: 11/19/18  0024     History   Chief Complaint Chief Complaint  Patient presents with  . Emesis  . Dysuria    HPI Jaleisa Ace is a 27 y.o. female is currently G1P0 who presents to the ED today for N/V.  Patient reports that she was seen here on 11/23 and diagnosed with a intrauterine pregnancy.  On chart review patient did have an ultrasound at that time that showed a single intrauterine pregnancy with estimated gestational age of [redacted] weeks and 2 days.  Patient reports since being discharged she has been having left lower quadrant abdominal cramping, constant nausea as well as approximately 12 episodes of nonbilious, nonbloody emesis per day.  She notes that her last episode of emesis was 2 hours ago.  She notes that she did have a small streaks of blood in this episode of vomiting.  The patient also reported to nursing staff that she was having chest pain but when discussing with me she denies this and states that she has been having acid reflux with burping and belching constantly.  She notes that she has been trying to treat her symptoms with smoking marijuana frequently every day that increases her appetite but does not relieve her nausea or vomiting.  Patient reports that she is not taking a prenatal vitamin.  She does not plan to keep the pregnancy and [facility].  The patient denies any fever, chills, shortness of breath, cough, flank pain, lower abdominal pain, vaginal discharge or vaginal bleeding.  She does note some dysuria and thinks she may have a UTI.  HPI  Past Medical History:  Diagnosis Date  . Asthma   . Bipolar disorder (HCC)   . Depression   . Hydrocephalus (HCC)   . Obesity   . Trichimoniasis     There are no active problems to display for this patient.   Past Surgical History:  Procedure Laterality Date  . BRAIN  SURGERY       OB History    Gravida  1   Para      Term      Preterm      AB      Living        SAB      TAB      Ectopic      Multiple      Live Births               Home Medications    Prior to Admission medications   Medication Sig Start Date End Date Taking? Authorizing Provider  ranitidine (ZANTAC) 150 MG tablet Take 150 mg by mouth once.   Yes [provider]  metoCLOPramide (REGLAN) 10 MG tablet Take 1 tablet (10 mg total) by mouth every 6 (six) hours as needed for nausea or vomiting. Patient not taking: Reported on 11/19/2018 11/09/18   Ward, Layla Maw, DO    Family History No family history on file.  Social History Social History   Tobacco Use  . Smoking status: Former Games developer  . Smokeless tobacco: Never Used  Substance Use Topics  . Alcohol use: Yes    Comment: occ  . Drug use: Yes    Types: Marijuana    Comment: everyday     Allergies   Strawberry extract   Review of Systems Review of Systems  All other  systems reviewed and are negative.    Physical Exam Updated Vital Signs BP (!) 144/76 (BP Location: Right Arm)   Pulse 70   Temp 98.2 F (36.8 C) (Oral)   Resp 16   LMP 09/04/2018   SpO2 100%   Physical Exam  Constitutional: She appears well-developed and well-nourished.  HENT:  Head: Normocephalic and atraumatic.  Right Ear: External ear normal.  Left Ear: External ear normal.  Nose: Nose normal.  Mouth/Throat: Uvula is midline, oropharynx is clear and moist and mucous membranes are normal. No tonsillar exudate.  Eyes: Pupils are equal, round, and reactive to light. Right eye exhibits no discharge. Left eye exhibits no discharge. No scleral icterus.  Neck: Trachea normal. Neck supple. No spinous process tenderness present. No neck rigidity. Normal range of motion present.  Cardiovascular: Normal rate, regular rhythm and intact distal pulses.  No murmur heard. Pulses:      Radial pulses are 2+ on the right  side, and 2+ on the left side.       Dorsalis pedis pulses are 2+ on the right side, and 2+ on the left side.       Posterior tibial pulses are 2+ on the right side, and 2+ on the left side.  Pulmonary/Chest: Effort normal and breath sounds normal. She exhibits no tenderness.  Abdominal: Soft. Bowel sounds are normal. She exhibits no distension. There is no tenderness. There is no rigidity, no rebound, no guarding, no CVA tenderness, no tenderness at McBurney's point and negative Murphy's sign.  Musculoskeletal: She exhibits no edema.  Lymphadenopathy:    She has no cervical adenopathy.  Neurological: She is alert.  Skin: Skin is warm and dry. No rash noted. She is not diaphoretic.  Psychiatric: She has a normal mood and affect.  Nursing note and vitals reviewed.    ED Treatments / Results  Labs (all labs ordered are listed, but only abnormal results are displayed) Labs Reviewed  URINE CULTURE   Results for orders placed or performed during the hospital encounter of 11/18/18  Lipase, blood  Result Value Ref Range   Lipase 27 11 - 51 U/L  Comprehensive metabolic panel  Result Value Ref Range   Sodium 134 (L) 135 - 145 mmol/L   Potassium 3.0 (L) 3.5 - 5.1 mmol/L   Chloride 102 98 - 111 mmol/L   CO2 22 22 - 32 mmol/L   Glucose, Bld 106 (H) 70 - 99 mg/dL   BUN 5 (L) 6 - 20 mg/dL   Creatinine, Ser 1.610.66 0.44 - 1.00 mg/dL   Calcium 9.5 8.9 - 09.610.3 mg/dL   Total Protein 8.1 6.5 - 8.1 g/dL   Albumin 3.9 3.5 - 5.0 g/dL   AST 17 15 - 41 U/L   ALT 17 0 - 44 U/L   Alkaline Phosphatase 60 38 - 126 U/L   Total Bilirubin 0.7 0.3 - 1.2 mg/dL   GFR calc non Af Amer >60 >60 mL/min   GFR calc Af Amer >60 >60 mL/min   Anion gap 10 5 - 15  CBC  Result Value Ref Range   WBC 13.3 (H) 4.0 - 10.5 K/uL   RBC 4.69 3.87 - 5.11 MIL/uL   Hemoglobin 13.1 12.0 - 15.0 g/dL   HCT 04.541.6 40.936.0 - 81.146.0 %   MCV 88.7 80.0 - 100.0 fL   MCH 27.9 26.0 - 34.0 pg   MCHC 31.5 30.0 - 36.0 g/dL   RDW 91.413.0 78.211.5 -  95.615.5 %   Platelets  425 (H) 150 - 400 K/uL   nRBC 0.0 0.0 - 0.2 %  Urinalysis, Routine w reflex microscopic  Result Value Ref Range   Color, Urine YELLOW YELLOW   APPearance HAZY (A) CLEAR   Specific Gravity, Urine 1.030 1.005 - 1.030   pH 6.0 5.0 - 8.0   Glucose, UA 150 (A) NEGATIVE mg/dL   Hgb urine dipstick MODERATE (A) NEGATIVE   Bilirubin Urine NEGATIVE NEGATIVE   Ketones, ur 80 (A) NEGATIVE mg/dL   Protein, ur 161 (A) NEGATIVE mg/dL   Nitrite NEGATIVE NEGATIVE   Leukocytes, UA MODERATE (A) NEGATIVE   RBC / HPF 21-50 0 - 5 RBC/hpf   WBC, UA >50 (H) 0 - 5 WBC/hpf   Bacteria, UA NONE SEEN NONE SEEN   Squamous Epithelial / LPF 6-10 0 - 5   Mucus PRESENT   hCG, quantitative, pregnancy  Result Value Ref Range   hCG, Beta Chain, Quant, S 096,045 (H) <5 mIU/mL  I-Stat beta hCG blood, ED  Result Value Ref Range   I-stat hCG, quantitative >2,000.0 (H) <5 mIU/mL   Comment 3           US Ob Less Than 14 Weeks With Ob Transvaginal  Result Date: 11/09/2018 CLINICAL DATA:  Nausea and vomiting for 16 days. Last menstrual period is not indicated. Quantitative beta HCG is 72,000. EXAM: OBSTETRIC <14 WK [place] AND TRANSVAGINAL OB US TECHNIQUE: Both transabdominal and transvaginal ultrasound examinations were performed for complete evaluation of the gestation as well as the maternal uterus, adnexal regions, and pelvic cul-de-sac. Transvaginal technique was performed to assess early pregnancy. COMPARISON:  None. FINDINGS: Intrauterine gestational sac: A single intrauterine gestational sac is present. Yolk sac:  A yolk sac is present. Embryo:  Fetal pole is present. Cardiac Activity: Fetal cardiac activity is observed. Heart Rate: 162 bpm CRL:  18 mm   8 w   2 d                  [place] EDC: 06/09/2019 Subchorionic hemorrhage:  None visualized. Maternal uterus/adnexae: Uterus is anteverted. No myometrial mass lesions identified. Both ovaries are visualized and appear normal. Corpus luteal cyst on the left. No  free fluid. IMPRESSION: Single intrauterine pregnancy. Estimated gestational age by crown-rump length is 8 weeks 2 days. No acute complication demonstrated sonographically. Electronically Signed   By: Burman Nieves M.D.   On: 11/09/2018 06:22     EKG None  Radiology No results found.  Procedures Procedures (including critical care time)  Medications Ordered in ED Medications  sodium chloride 0.9 % bolus 1,000 mL (has no administration in time range)  metoCLOPramide (REGLAN) injection 10 mg (has no administration in time range)  diphenhydrAMINE (BENADRYL) injection 25 mg (has no administration in time range)     Initial Impression / Assessment and Plan / ED Course  I have reviewed the triage vital signs and the nursing notes.  Pertinent labs & imaging results that were available during my care of the patient were reviewed by me and considered in my medical decision making (see chart for details).     27 year old G1P0 that is approximately 9 weeks and 5 days pregnant who presents emergency department today for left upper quadrant abdominal cramping, nausea, vomiting, dysuria, as well as burping and belching.  Patient reports she is been trying marijuana multiple times per day to try to help with her symptoms without relief.  No other interventions.  On presentation the patient's vital signs are reassuring.  No fever, tachycardia, tachypnea, hypoxia or hypotension.  Patient speaks membranes are moist.  Heart regular rate and rhythm.  Lungs clear to auscultation bilaterally.  Abdomen is soft and nontender.  Negative right upper quadrant tenderness and negative Murphy sign.  Patient did have a intrauterine pregnancy on last ultrasound.  She has no lower abdominal pain or vaginal bleeding.  She denies any vaginal discharge.  No indication for pelvic at this time. Labs done during previous visit earlier this morning.  CBC with mild leukocytosis.  UA is with UTI.  Patient without CVA  tenderness.  Will give ceftriaxone, fluids, Reglan and plan to treat with antibiotics.  Urine culture was sent.  CMP with mild hypokalemia of 3.0.  No anion gap acidosis.  LFTs within normal limits.  Lipase within normal limits.  No indication for imaging at this time.  Discussed with patient that marijuana can be harmful in pregnancy.  She is also not on a prenatal vitamin.  Patient states that she does not plan to keep this pregnancy and is [facility] to [facility].  Patient's nausea and vomiting have resolved.  She is tolerating p.o. fluids.  Will discharge her home on a short course of Reglan.  Will give antibiotics for U now TI.  Urine culture is pending.  Patient is without fever, or CVA tenderness.  Recommend women's outpatient follow-up.  Return precautions discussed.  Patient appears safe for discharge.  Patient case discussed with Dr. Jacqulyn Bath who is in agreement with plan.  Final Clinical Impressions(s) / ED Diagnoses   Final diagnoses:  [redacted] weeks gestation of pregnancy  Nausea and vomiting in pregnancy  Cystitis    ED Discharge Orders         Ordered    cephALEXin (KEFLEX) 500 MG capsule  2 times daily     11/19/18 0957    metoCLOPramide (REGLAN) 10 MG tablet  Every 6 hours PRN     11/19/18 0957           Jacinto Halim, PA-C 11/19/18 1004    Long, Arlyss Repress, MD 11/19/18 1956

## 2018-11-20 LAB — URINE CULTURE

## 2018-11-30 DIAGNOSIS — Z3A10 10 weeks gestation of pregnancy: Secondary | ICD-10-CM | POA: Insufficient documentation

## 2018-11-30 DIAGNOSIS — J45909 Unspecified asthma, uncomplicated: Secondary | ICD-10-CM | POA: Insufficient documentation

## 2018-11-30 DIAGNOSIS — R1310 Dysphagia, unspecified: Secondary | ICD-10-CM | POA: Insufficient documentation

## 2018-11-30 DIAGNOSIS — O2391 Unspecified genitourinary tract infection in pregnancy, first trimester: Secondary | ICD-10-CM | POA: Insufficient documentation

## 2018-11-30 DIAGNOSIS — Z79899 Other long term (current) drug therapy: Secondary | ICD-10-CM | POA: Insufficient documentation

## 2018-11-30 DIAGNOSIS — Z87891 Personal history of nicotine dependence: Secondary | ICD-10-CM | POA: Insufficient documentation

## 2018-12-01 ENCOUNTER — Other Ambulatory Visit: Payer: Self-pay

## 2018-12-01 ENCOUNTER — Encounter (HOSPITAL_BASED_OUTPATIENT_CLINIC_OR_DEPARTMENT_OTHER): Payer: Self-pay | Admitting: Emergency Medicine

## 2018-12-01 ENCOUNTER — Emergency Department (HOSPITAL_BASED_OUTPATIENT_CLINIC_OR_DEPARTMENT_OTHER)
Admission: EM | Admit: 2018-12-01 | Discharge: 2018-12-01 | Disposition: A | Discharge status: Home / Self Care | Payer: Self-pay | Attending: Emergency Medicine | Admitting: Emergency Medicine

## 2018-12-01 DIAGNOSIS — O2341 Unspecified infection of urinary tract in pregnancy, first trimester: Secondary | ICD-10-CM

## 2018-12-01 DIAGNOSIS — R1310 Dysphagia, unspecified: Secondary | ICD-10-CM

## 2018-12-01 DIAGNOSIS — E876 Hypokalemia: Secondary | ICD-10-CM

## 2018-12-01 LAB — URINALYSIS, ROUTINE W REFLEX MICROSCOPIC
Glucose, UA: NEGATIVE mg/dL
Ketones, ur: >80 mg/dL — AB
Nitrite: NEGATIVE
Protein, ur: 100 mg/dL — AB
Specific Gravity, Urine: >1.03 — ABNORMAL HIGH (ref 1.005–1.030)
pH: 5.5 (ref 5.0–8.0)

## 2018-12-01 LAB — URINALYSIS, MICROSCOPIC (REFLEX)

## 2018-12-01 LAB — BASIC METABOLIC PANEL
Anion gap: 13 (ref 5–15)
BUN: 8 mg/dL (ref 6–20)
CO2: 22 mmol/L (ref 22–32)
Calcium: 9.4 mg/dL (ref 8.9–10.3)
Chloride: 98 mmol/L (ref 98–111)
Creatinine, Ser: 0.78 mg/dL (ref 0.44–1.00)
GFR calc Af Amer: >60 mL/min (ref >60)
GFR calc non Af Amer: >60 mL/min (ref >60)
Glucose, Bld: 106 mg/dL — ABNORMAL HIGH (ref 70–99)
POTASSIUM: 2.6 mmol/L — AB (ref 3.5–5.1)
Sodium: 133 mmol/L — ABNORMAL LOW (ref 135–145)

## 2018-12-01 LAB — CBC
HCT: 41.8 % (ref 36.0–46.0)
HEMOGLOBIN: 14.2 g/dL (ref 12.0–15.0)
MCH: 29 pg (ref 26.0–34.0)
MCHC: 34 g/dL (ref 30.0–36.0)
MCV: 85.5 fL (ref 80.0–100.0)
Platelets: 311 10*3/uL (ref 150–400)
RBC: 4.89 MIL/uL (ref 3.87–5.11)
RDW: 12.6 % (ref 11.5–15.5)
WBC: 14.9 10*3/uL — ABNORMAL HIGH (ref 4.0–10.5)
nRBC: 0 % (ref 0.0–0.2)

## 2018-12-01 LAB — HCG, QUANTITATIVE, PREGNANCY: hCG, Beta Chain, Quant, S: 103704 m[IU]/mL — ABNORMAL HIGH (ref <5)

## 2018-12-01 LAB — TROPONIN I: Troponin I: <0.03 ng/mL (ref <0.03)

## 2018-12-01 MED ORDER — POTASSIUM CHLORIDE 10 MEQ/100ML IV SOLN
10.0000 meq | INTRAVENOUS | Status: AC
  Administered 2018-12-01 (×2): 10 meq via INTRAVENOUS
  Filled 2018-12-01 (×2): qty 100

## 2018-12-01 MED ORDER — LANSOPRAZOLE 30 MG PO CPDR: 30.0000 mg | DELAYED_RELEASE_CAPSULE | Freq: Every day | ORAL | 0 refills | Status: DC

## 2018-12-01 MED ORDER — NITROFURANTOIN MONOHYD MACRO 100 MG PO CAPS: 100.0000 mg | ORAL_CAPSULE | Freq: Two times a day (BID) | ORAL | 0 refills | Status: DC

## 2018-12-01 MED ORDER — POTASSIUM CHLORIDE CRYS ER 20 MEQ PO TBCR
40.0000 meq | EXTENDED_RELEASE_TABLET | Freq: Once | ORAL | Status: AC
  Administered 2018-12-01: 40 meq via ORAL
  Filled 2018-12-01: qty 2

## 2018-12-01 MED ORDER — METOCLOPRAMIDE HCL 10 MG PO TABS: 10.0000 mg | ORAL_TABLET | Freq: Four times a day (QID) | ORAL | 0 refills | Status: DC | PRN

## 2018-12-01 MED ORDER — POTASSIUM CHLORIDE ER 10 MEQ PO TBCR: 10.0000 meq | EXTENDED_RELEASE_TABLET | Freq: Two times a day (BID) | ORAL | 0 refills | Status: DC

## 2018-12-01 NOTE — ED Notes (Signed)
Date and time results received: 12/01/18 1:02 AM (use smartphrase ".now" to insert current time)  Test: Potassium Critical Value: 2.6  Name of Provider Notified: Dr. Shela CommonsJ Mulpus  Orders Received? Or Actions Taken?:

## 2018-12-01 NOTE — ED Triage Notes (Signed)
Patient reports that she has had chest pain and trouble breathing fopr the last week - she states that she is unable to take a deep breath. She also reports that swallows food and it just sits in her throat and then she vomits it up - the patient reports that she cant keep nothing down.

## 2018-12-01 NOTE — ED Notes (Signed)
Pt understood dc material. NAD noted. Pt tolerated PO fluids. Scripts given at [facility]

## 2018-12-01 NOTE — ED Provider Notes (Addendum)
[facility] Provider Note: Jean Dell, MD, FACEP  CSN: [id number] MRN: [id number] ARRIVAL: 11/30/18 at 2349 ROOM: MH01/MH01   CHIEF COMPLAINT  Dysphagia   HISTORY OF PRESENT ILLNESS  12/01/18 5:14 AM Jean Mata is a 27 y.o. female who is about [redacted] weeks pregnant.  For about a week now she has had difficulty eating and drinking.  Particularly when eating solid she feels like it gets stuck and comes back up.  This occurs less frequently with liquids.  She was seen at [facility] on December 3 for nausea and vomiting and was diagnosed with a urinary tract infection.  She was treated with Keflex and Reglan.  She continues to have some mild dysuria.  Her dysphagia is worse at night.  Of note she was able to swallow K-Dur in the emergency department without difficulty this visit.  Although she reported chest pain and shortness of breath to her triage nurse she downplays these symptoms to me.   Past Medical History:  Diagnosis Date  . Asthma   . Bipolar disorder (HCC)   . Depression   . Hydrocephalus (HCC)   . Obesity   . Trichimoniasis     Past Surgical History:  Procedure Laterality Date  . BRAIN SURGERY      History reviewed. No pertinent family history.  Social History   Tobacco Use  . Smoking status: Former Games developer  . Smokeless tobacco: Never Used  Substance Use Topics  . Alcohol use: Yes    Comment: occ  . Drug use: Yes    Types: Marijuana    Comment: everyday    Prior to Admission medications   Medication Sig Start Date End Date Taking? Authorizing Provider  metoCLOPramide (REGLAN) 10 MG tablet Take 1 tablet (10 mg total) by mouth every 6 (six) hours as needed for nausea or vomiting. 11/19/18   Maczis, Elmer Sow, PA-C  ranitidine (ZANTAC) 150 MG tablet Take 150 mg by mouth once.    [provider]    Allergies Strawberry extract   REVIEW OF SYSTEMS  Negative except as noted here or in the History of Present Illness.   PHYSICAL  EXAMINATION  Initial Vital Signs Blood pressure (!) 145/96, pulse (!) 112, temperature 98.7 F (37.1 C), temperature source Oral, resp. rate 19, height 6\' 1"  (1.854 m), weight 111.5 kg, last menstrual period 09/04/2018, SpO2 100 %.  Examination General: Well-developed, well-nourished female in no acute distress; appearance consistent with age of record HENT: normocephalic; atraumatic Eyes: pupils equal, round and reactive to light; extraocular muscles intact Neck: supple Heart: regular rate and rhythm Lungs: clear to auscultation bilaterally Abdomen: soft; nondistended; nontender; bowel sounds present Extremities: No deformity; full range of motion; pulses normal Neurologic: Awake, alert and oriented; motor function intact in all extremities and symmetric; no facial droop Skin: Warm and dry Psychiatric: Normal mood and affect   RESULTS  Summary of this visit's results, reviewed by myself:   EKG Interpretation  Date/Time:  Sunday December 01 2018 00:09:20 EST Ventricular Rate:  131 PR Interval:  148 QRS Duration: 80 QT Interval:  298 QTC Calculation: 440 R Axis:   73 Text Interpretation:  Sinus tachycardia T wave abnormality, consider inferior ischemia Abnormal ECG Rate is faster Confirmed by Tashanti Dalporto ([id number]) on 12/01/2018 12:17:00 AM      Laboratory Studies: Results for orders placed or performed during the hospital encounter of 12/01/18 (from the past 24 hour(s))  Basic metabolic panel     Status: Abnormal  Collection Time: 12/01/18 12:26 AM  Result Value Ref Range   Sodium 133 (L) 135 - 145 mmol/L   Potassium 2.6 (LL) 3.5 - 5.1 mmol/L   Chloride 98 98 - 111 mmol/L   CO2 22 22 - 32 mmol/L   Glucose, Bld 106 (H) 70 - 99 mg/dL   BUN 8 6 - 20 mg/dL   Creatinine, Ser 1.610.78 0.44 - 1.00 mg/dL   Calcium 9.4 8.9 - 09.610.3 mg/dL   GFR calc non Af Amer >60 >60 mL/min   GFR calc Af Amer >60 >60 mL/min   Anion gap 13 5 - 15  CBC     Status: Abnormal   Collection Time:  12/01/18 12:26 AM  Result Value Ref Range   WBC 14.9 (H) 4.0 - 10.5 K/uL   RBC 4.89 3.87 - 5.11 MIL/uL   Hemoglobin 14.2 12.0 - 15.0 g/dL   HCT 04.541.8 40.936.0 - 81.146.0 %   MCV 85.5 80.0 - 100.0 fL   MCH 29.0 26.0 - 34.0 pg   MCHC 34.0 30.0 - 36.0 g/dL   RDW 91.412.6 78.211.5 - 95.615.5 %   Platelets 311 150 - 400 K/uL   nRBC 0.0 0.0 - 0.2 %  Troponin I - ONCE - STAT     Status: None   Collection Time: 12/01/18 12:27 AM  Result Value Ref Range   Troponin I <0.03 <0.03 ng/mL  hCG, quantitative, pregnancy     Status: Abnormal   Collection Time: 12/01/18 12:28 AM  Result Value Ref Range   hCG, Beta Chain, Quant, S 103,704 (H) <5 mIU/mL  Urinalysis, Routine w reflex microscopic     Status: Abnormal   Collection Time: 12/01/18 12:52 AM  Result Value Ref Range   Color, Urine ORANGE (A) YELLOW   APPearance CLOUDY (A) CLEAR   Specific Gravity, Urine >1.030 (H) 1.005 - 1.030   pH 5.5 5.0 - 8.0   Glucose, UA NEGATIVE NEGATIVE mg/dL   Hgb urine dipstick SMALL (A) NEGATIVE   Bilirubin Urine SMALL (A) NEGATIVE   Ketones, ur >80 (A) NEGATIVE mg/dL   Protein, ur 213100 (A) NEGATIVE mg/dL   Nitrite NEGATIVE NEGATIVE   Leukocytes, UA MODERATE (A) NEGATIVE  Urinalysis, Microscopic (reflex)     Status: Abnormal   Collection Time: 12/01/18 12:52 AM  Result Value Ref Range   RBC / HPF 0-5 0 - 5 RBC/hpf   WBC, UA 21-50 0 - 5 WBC/hpf   Bacteria, UA MANY (A) NONE SEEN   Squamous Epithelial / LPF 0-5 0 - 5   Mucus PRESENT    Trichomonas, UA PRESENT (A) NONE SEEN   Imaging Studies: No results found.  ED COURSE and MDM  Nursing notes and initial vitals signs, including pulse oximetry, reviewed.  Vitals:   12/01/18 0004 12/01/18 0006 12/01/18 0035 12/01/18 0139  BP: (!) 147/96   (!) 145/96  Pulse: (!) 129  (!) 107 (!) 112  Resp: 20   19  Temp: 98.9 F (37.2 C)   98.7 F (37.1 C)  TempSrc: Oral   Oral  SpO2: 100%  100% 100%  Weight:  111.5 kg    Height:  6\' 1"  (1.854 m)     5:25 AM Patient given IV and  oral potassium supplementation in ED along with a liter of normal saline.  Her symptoms may be related to GERD and we will start her on Prevacid as this is safe in pregnancy.  Her urine is still showing evidence of a urinary tract  infection and we will place her on Macrobid as she was previously treated with Keflex.Marland Kitchen  5:55 AM Patient drinking ginger ale without emesis.  The patient's quantitative beta-hCG is actually lower today than previously.  This may represent an intrauterine demise or it could represent normal variation between laboratories.  She was advised to follow-up with [facility].  PROCEDURES    ED DIAGNOSES     ICD-10-CM   1. Dysphagia, unspecified type R13.10   2. UTI in pregnancy, antepartum, first trimester O23.41   3. Hypokalemia E87.6        Marty Uy, Jonny Ruiz, MD 12/01/18 0555    Paula Libra, MD 12/01/18 [phone]

## 2018-12-09 ENCOUNTER — Inpatient Hospital Stay (HOSPITAL_COMMUNITY)
Admission: AD | Admit: 2018-12-09 | Discharge: 2018-12-09 | Disposition: A | Discharge status: Home / Self Care | Payer: Medicaid Other | Attending: Obstetrics & Gynecology | Admitting: Obstetrics & Gynecology

## 2018-12-09 ENCOUNTER — Encounter (HOSPITAL_COMMUNITY): Payer: Self-pay | Admitting: *Deleted

## 2018-12-09 DIAGNOSIS — F319 Bipolar disorder, unspecified: Secondary | ICD-10-CM | POA: Insufficient documentation

## 2018-12-09 DIAGNOSIS — O99511 Diseases of the respiratory system complicating pregnancy, first trimester: Secondary | ICD-10-CM | POA: Insufficient documentation

## 2018-12-09 DIAGNOSIS — O99341 Other mental disorders complicating pregnancy, first trimester: Secondary | ICD-10-CM | POA: Insufficient documentation

## 2018-12-09 DIAGNOSIS — O23591 Infection of other part of genital tract in pregnancy, first trimester: Secondary | ICD-10-CM

## 2018-12-09 DIAGNOSIS — Z87891 Personal history of nicotine dependence: Secondary | ICD-10-CM | POA: Insufficient documentation

## 2018-12-09 DIAGNOSIS — O99211 Obesity complicating pregnancy, first trimester: Secondary | ICD-10-CM | POA: Insufficient documentation

## 2018-12-09 DIAGNOSIS — A5901 Trichomonal vulvovaginitis: Secondary | ICD-10-CM | POA: Insufficient documentation

## 2018-12-09 DIAGNOSIS — N930 Postcoital and contact bleeding: Secondary | ICD-10-CM | POA: Insufficient documentation

## 2018-12-09 DIAGNOSIS — O98311 Other infections with a predominantly sexual mode of transmission complicating pregnancy, first trimester: Secondary | ICD-10-CM | POA: Insufficient documentation

## 2018-12-09 DIAGNOSIS — J45909 Unspecified asthma, uncomplicated: Secondary | ICD-10-CM | POA: Insufficient documentation

## 2018-12-09 DIAGNOSIS — Z79899 Other long term (current) drug therapy: Secondary | ICD-10-CM | POA: Insufficient documentation

## 2018-12-09 DIAGNOSIS — Z3A12 12 weeks gestation of pregnancy: Secondary | ICD-10-CM | POA: Insufficient documentation

## 2018-12-09 LAB — URINALYSIS, ROUTINE W REFLEX MICROSCOPIC
Bacteria, UA: NONE SEEN
Bilirubin Urine: NEGATIVE
Glucose, UA: 50 mg/dL — AB
Ketones, ur: NEGATIVE mg/dL
Leukocytes, UA: NEGATIVE
Nitrite: NEGATIVE
Protein, ur: NEGATIVE mg/dL
Specific Gravity, Urine: 1.012 (ref 1.005–1.030)
pH: 9 — ABNORMAL HIGH (ref 5.0–8.0)

## 2018-12-09 LAB — WET PREP, GENITAL
Clue Cells Wet Prep HPF POC: NONE SEEN
Sperm: NONE SEEN
Yeast Wet Prep HPF POC: NONE SEEN

## 2018-12-09 MED ORDER — METRONIDAZOLE 500 MG PO TABS
2000.0000 mg | ORAL_TABLET | Freq: Once | ORAL | Status: AC
  Administered 2018-12-09: 2000 mg via ORAL
  Filled 2018-12-09: qty 4

## 2018-12-09 NOTE — Discharge Instructions (Signed)
Expedited Partner Therapy:  °Information Sheet for Patients and Partners  °            ° °You have been offered expedited partner therapy (EPT). This information sheet contains important information and warnings you need to be aware of, so please read it carefully.  ° °Expedited Partner Therapy (EPT) is the clinical practice of treating the sexual partners of persons who receive chlamydia, gonorrhea, or trichomoniasis diagnoses by providing medications or prescriptions to the patient. Patients then provide partners with these therapies without the health-care provider having examined the partner. In other words, EPT is a convenient, fast and private way for patients to help their sexual partners get treated.  ° °Chlamydia and gonorrhea are bacterial infections you get from having sex with a person who is already infected. Trichomoniasis (or “trich”) is a very common sexually transmitted infection (STI) that is caused by infection with a protozoan parasite called Trichomonas vaginalis.  Many people with these infections don’t know it because they feel fine, but without treatment these infections can cause serious health problems, such as pelvic inflammatory disease, ectopic pregnancy, infertility and increased risk of HIV.  ° °It is important to get treated as soon as possible to protect your health, to avoid spreading these infections to others, and to prevent yourself from becoming re-infected. The good news is these infections can be easily cured with proper antibiotic medicine. The best way to take care of your self is to see a doctor or go to your local health department. If you are not able to see a doctor or other medical provider, you should take EPT.  ° ° °Recommended Medication: °EPT for Chlamydia:  Azithromycin (Zithromax) 1 gram orally in a single dose °EPT for Gonorrhea:  Cefixime (Suprax) 400 milligrams orally in a single dose PLUS azithromycin (Zithromax) 1 gram orally in a single dose °EPT for  Trichomoniasis:  Metronidazole (Flagyl) 2 grams orally in a single dose ° ° °These medicines are very safe. However, you should not take them if you have ever had an allergic reaction (like a rash) to any of these medicines: azithromycin (Zithromax), erythromycin, clarithromycin (Biaxin), metronidazole (Flagyl), tinidazole (Tindimax). If you are uncertain about whether you have an allergy, call your medical provider or pharmacist before taking this medicine. If you have a serious, long-term illness like kidney, liver or heart disease, colitis or stomach problems, or you are currently taking other prescription medication, talk to your provider before taking this medication.  ° °Women: If you have lower belly pain, pain during sex, vomiting, or a fever, do not take this medicine. Instead, you should see a medical provider to be certain you do not have pelvic inflammatory disease (PID). PID can be serious and lead to infertility, pregnancy problems or chronic pelvic pain.  ° °Pregnant Women: It is very important for you to see a doctor to get pregnancy services and pre-natal care. These antibiotics for EPT are safe for pregnant women, but you still need to see a medical provider as soon as possible. It is also important to note that Doxycycline is an alternative therapy for chlamydia, but it should not be taken by someone who is pregnant.  ° °Men: If you have pain or swelling in the testicles or a fever, do not take this medicine and see a medical provider.    ° °Men who have sex with men (MSM): MSM in [place] continue to experience high rates of syphilis and HIV. Many MSM with gonorrhea or   chlamydia could also have syphilis and/or HIV and not know it. If you are a man who has sex with other men, it is very important that you see a medical provider and are tested for HIV and syphilis. EPT is not recommended for gonorrhea for MSM.  Recommended treatment for gonorrhea for MSM is Rocephin (shot) AND azithromycin  due to decreased cure rate.  Please see your medical provider if this is the case.   ° °Along with this information sheet is a prescription for the medicine. If you receive a prescription it will be in your name and will indicate your date of birth, or it will be in the name of “Expedited Partner Therapy”.   In either case, you can have the prescription filled at a pharmacy. You will be responsible for the cost of the medicine, unless you have prescription drug coverage. In that case, you could provide your name so the pharmacy could bill your health plan.  ° °Take the medication as directed. Some people will have a mild, upset stomach, which does not last long. AVOID alcohol 24 hours after taking metronidazole (Flagyl) to reduce the possibility of a disulfiram-like reaction (severe vomiting and abdominal pain).  After taking the medicine, do not have sex for 7 days. Do not share this medicine or give it to anyone else. It is important to tell everyone you have had sex with in the last 60 days that they need to go and get tested for sexually transmitted infections.  ° °Ways to prevent these and other sexually transmitted infections (STIs):  ° °• Abstain from sex. This is the only sure way to avoid getting an STI.  °• Use barrier methods, such as condoms, consistently and correctly.  °• Limit the number of sexual partners.  °• Have regular physical exams, including testing for STIs.  ° °For more information about EPT or other issues pertaining to an STI, please contact your medical provider or the [place] [facility] at [phone] or [email or web address]   ° °

## 2018-12-09 NOTE — MAU Note (Signed)
Pt states she saw some vaginal bleeding yesterday that has continued today with some small clots about as much bleeding as a period.  She started having some mild cramps around 0400 this morning.  Last intercourse was last night.  The bleeding started after that.

## 2018-12-09 NOTE — MAU Provider Note (Addendum)
History     CSN: [id number]  Arrival date and time: 12/09/18 1345   First Provider Initiated Contact with Patient 12/09/18 1414      Chief Complaint  Patient presents with  . Vaginal Bleeding  . Abdominal Pain   Jean Mata is a 27 y.o. G1P0 at 3653w4d by early US who presents to MAU with complaints of abdominal pain and vaginal bleeding. She reports abdominal pain and vaginal bleeding started occurring this morning. Describes abdominal pain as lower abdominal cramping, "unsure of how painful it is because my periods are painful". Has not taken any medication for abdominal pain. She describes vaginal bleeding as spotting, reddish brown, denies having to wear a pad or change pads. Recent IC last night. Denies nausea/vomiting, urinary symptoms. Has not started prenatal care- this was not a planned pregnancy, patient reports taking plan B.    OB History    Gravida  1   Para      Term      Preterm      AB      Living        SAB      TAB      Ectopic      Multiple      Live Births              Past Medical History:  Diagnosis Date  . Asthma   . Bipolar disorder (HCC)   . Depression   . Hydrocephalus (HCC)   . Obesity   . Trichimoniasis     Past Surgical History:  Procedure Laterality Date  . BRAIN SURGERY      History reviewed. No pertinent family history.  Social History   Tobacco Use  . Smoking status: Former Games developermoker  . Smokeless tobacco: Never Used  Substance Use Topics  . Alcohol use: Yes    Comment: occ  . Drug use: Yes    Types: Marijuana    Comment: everyday    Allergies:  Allergies  Allergen Reactions  . Strawberry Extract Itching    Medications Prior to Admission  Medication Sig Dispense Refill Last Dose  . lansoprazole (PREVACID) 30 MG capsule Take 1 capsule (30 mg total) by mouth daily at 12 noon. 30 capsule 0   . metoCLOPramide (REGLAN) 10 MG tablet Take 1 tablet (10 mg total) by mouth every 6 (six) hours as needed for  nausea or vomiting. 12 tablet 0   . nitrofurantoin, macrocrystal-monohydrate, (MACROBID) 100 MG capsule Take 1 capsule (100 mg total) by mouth 2 (two) times daily. X 7 days 10 capsule 0   . potassium chloride (K-DUR) 10 MEQ tablet Take 1 tablet (10 mEq total) by mouth 2 (two) times daily. 14 tablet 0     Review of Systems  Constitutional: Negative.   Respiratory: Negative.   Cardiovascular: Negative.   Gastrointestinal: Positive for abdominal pain. Negative for constipation, diarrhea, nausea and vomiting.  Genitourinary: Positive for vaginal bleeding. Negative for difficulty urinating, dysuria, frequency, hematuria and urgency.   Physical Exam   Blood pressure 129/71, pulse 100, temperature 97.8 F (36.6 C), temperature source Oral, resp. rate 16, last menstrual period 10/11/2018, SpO2 100 %.  Physical Exam  Nursing note and vitals reviewed. Constitutional: She is oriented to person, place, and time. She appears well-developed and well-nourished. No distress.  Cardiovascular: Normal rate, regular rhythm and normal heart sounds.  Respiratory: Effort normal and breath sounds normal. No respiratory distress. She has no wheezes.  GI: Soft. She exhibits no  distension. There is no abdominal tenderness. There is no rebound.  Genitourinary:    Vaginal bleeding present.  There is bleeding in the vagina.    Genitourinary Comments: Pelvic examination: cervix pink, cervix closed, scant amount of dark red vaginal bleeding present, negative CMT, uterus non tender    Neurological: She is alert and oriented to person, place, and time.  Psychiatric: She has a normal mood and affect. Her behavior is normal. Thought content normal.   FHR 150 by doppler- patient adamant about not wanting to hear FHT.   MAU Course  Procedures  MDM Orders Placed This Encounter  Procedures  . Wet prep, genital  . Urinalysis, Routine w reflex microscopic   Results for orders placed or performed during the hospital  encounter of 12/09/18 (from the past 24 hour(s))  Urinalysis, Routine w reflex microscopic     Status: Abnormal   Collection Time: 12/09/18  1:50 PM  Result Value Ref Range   Color, Urine YELLOW YELLOW   APPearance CLEAR CLEAR   Specific Gravity, Urine 1.012 1.005 - 1.030   pH 9.0 (H) 5.0 - 8.0   Glucose, UA 50 (A) NEGATIVE mg/dL   Hgb urine dipstick MODERATE (A) NEGATIVE   Bilirubin Urine NEGATIVE NEGATIVE   Ketones, ur NEGATIVE NEGATIVE mg/dL   Protein, ur NEGATIVE NEGATIVE mg/dL   Nitrite NEGATIVE NEGATIVE   Leukocytes, UA NEGATIVE NEGATIVE   RBC / HPF 0-5 0 - 5 RBC/hpf   WBC, UA 11-20 0 - 5 WBC/hpf   Bacteria, UA NONE SEEN NONE SEEN   Squamous Epithelial / LPF 0-5 0 - 5   Mucus PRESENT   Wet prep, genital     Status: Abnormal   Collection Time: 12/09/18  2:09 PM  Result Value Ref Range   Yeast Wet Prep HPF POC NONE SEEN NONE SEEN   Trich, Wet Prep PRESENT (A) NONE SEEN   Clue Cells Wet Prep HPF POC NONE SEEN NONE SEEN   WBC, Wet Prep HPF POC FEW (A) NONE SEEN   Sperm NONE SEEN    Discussed results of lab work with patient and partner- patient okay with discussion while partner at bedside. Discussed positive results of Trich. Treatments in MAU included treatment for trich prior to discharge home.   Educated on prenatal care and information given on OBGYN providers in [place], patient reports she is getting an abortion and "will not have this baby".   Rx for expedited partner therapy given and pt stable prior to discharge home. Discussed reasons to return to MAU.   Assessment and Plan   1. PCB (post coital bleeding)   2. Trichomonal vaginitis during pregnancy in first trimester   3. [redacted] weeks gestation of pregnancy    Discharge home  Return to MAU as needed  Rx for expedited partner therapy  Flagyl given to patient prior to discharge home  Abstain from intercourse for 10-14 days after treatment  GC/C pending   Sharyon CableVeronica C Anushka Hartinger CNM  12/09/2018, 3:25 PM

## 2018-12-10 LAB — GC/CHLAMYDIA PROBE AMP (~~LOC~~) NOT AT ARMC
Chlamydia: NEGATIVE
Neisseria Gonorrhea: NEGATIVE

## 2018-12-18 NOTE — L&D Delivery Note (Signed)
OB/GYN Faculty Practice Delivery Note  Jean Mata is a 28 y.o. G1P0 s/p SVD at [redacted]w[redacted]d. She was admitted for superimposed severe preeclampsia and her induction was started because of worsening BP and transaminitis.   ROM: 2h 74m with clear fluid GBS Status: unknown - received PCN Maximum Maternal Temperature: Temp (24hrs), Avg:97.9 F (36.6 C), Min:97.6 F (36.4 C), Max:98.3 F (36.8 C)  Labor Progress: . Foley balloon and cytotec . Transitioned to pitocin . AROM . Epidural placement  Delivery Date/Time: 04/16/19 at 1212 Delivery: Called to room and patient with fetal bradycardia after epidural placement. Nurse had recently checked patient, and she was complete. Delivery call placed for NICU team and prepared for delivery. Head delivered ROA. No nuchal cord present but loose body cord. Shoulder and body delivered in usual fashion. Infant with spontaneous cry, placed on mother's abdomen, dried and stimulated. Cord clamped x 2 immediately given preterm and fetal bradycardia, and cut by provider. Cord blood drawn as well as ABG Placenta delivered spontaneously with gentle cord traction. Fundus firm with massage and Pitocin. Labia, perineum, vagina, and cervix inspected inspected with 1st degree perineal and periurethral lacerations.   Placenta: spontaneous, intact, 3-vessel cord (sent to pathology) Complications: fetal bradycardia just prior to delivery Lacerations: 1st degree perineal and periurethral repaired with 3-0 Monocryl EBL: 418cc per Triton Analgesia: epidural and local lidocaine  Postpartum Planning [x]  message to sent to schedule follow-up  [x]  vaccines UTD - Tdap ordered to be given postpartum   Infant: Viable female  APGARs 6, 8  1480g  Jean Lyman S. Earlene Plater, DO OB/GYN Fellow, Faculty Practice

## 2019-02-24 ENCOUNTER — Emergency Department (HOSPITAL_BASED_OUTPATIENT_CLINIC_OR_DEPARTMENT_OTHER)
Admission: EM | Admit: 2019-02-24 | Discharge: 2019-02-25 | Disposition: A | Discharge status: Home / Self Care | Payer: BLUE CROSS/BLUE SHIELD | Attending: Emergency Medicine | Admitting: Emergency Medicine

## 2019-02-24 ENCOUNTER — Other Ambulatory Visit: Payer: Self-pay

## 2019-02-24 ENCOUNTER — Encounter (HOSPITAL_BASED_OUTPATIENT_CLINIC_OR_DEPARTMENT_OTHER): Payer: Self-pay | Admitting: *Deleted

## 2019-02-24 DIAGNOSIS — A0839 Other viral enteritis: Secondary | ICD-10-CM | POA: Diagnosis not present

## 2019-02-24 DIAGNOSIS — O2342 Unspecified infection of urinary tract in pregnancy, second trimester: Secondary | ICD-10-CM | POA: Diagnosis not present

## 2019-02-24 DIAGNOSIS — O99612 Diseases of the digestive system complicating pregnancy, second trimester: Secondary | ICD-10-CM | POA: Insufficient documentation

## 2019-02-24 DIAGNOSIS — O219 Vomiting of pregnancy, unspecified: Secondary | ICD-10-CM | POA: Diagnosis present

## 2019-02-24 DIAGNOSIS — R197 Diarrhea, unspecified: Secondary | ICD-10-CM | POA: Diagnosis not present

## 2019-02-24 DIAGNOSIS — Z3A20 20 weeks gestation of pregnancy: Secondary | ICD-10-CM | POA: Insufficient documentation

## 2019-02-24 DIAGNOSIS — A084 Viral intestinal infection, unspecified: Secondary | ICD-10-CM

## 2019-02-24 DIAGNOSIS — O99512 Diseases of the respiratory system complicating pregnancy, second trimester: Secondary | ICD-10-CM | POA: Diagnosis not present

## 2019-02-24 DIAGNOSIS — J45909 Unspecified asthma, uncomplicated: Secondary | ICD-10-CM | POA: Insufficient documentation

## 2019-02-24 DIAGNOSIS — Z87891 Personal history of nicotine dependence: Secondary | ICD-10-CM | POA: Diagnosis not present

## 2019-02-24 LAB — CBC
HEMATOCRIT: 34.6 % — AB (ref 36.0–46.0)
Hemoglobin: 11.3 g/dL — ABNORMAL LOW (ref 12.0–15.0)
MCH: 30.1 pg (ref 26.0–34.0)
MCHC: 32.7 g/dL (ref 30.0–36.0)
MCV: 92 fL (ref 80.0–100.0)
Platelets: 363 10*3/uL (ref 150–400)
RBC: 3.76 MIL/uL — ABNORMAL LOW (ref 3.87–5.11)
RDW: 13.8 % (ref 11.5–15.5)
WBC: 18.6 10*3/uL — ABNORMAL HIGH (ref 4.0–10.5)
nRBC: 0 % (ref 0.0–0.2)

## 2019-02-24 LAB — COMPREHENSIVE METABOLIC PANEL
ALT: 13 U/L (ref 0–44)
AST: 17 U/L (ref 15–41)
Albumin: 3.5 g/dL (ref 3.5–5.0)
Alkaline Phosphatase: 66 U/L (ref 38–126)
Anion gap: 9 (ref 5–15)
BUN: 7 mg/dL (ref 6–20)
CO2: 13 mmol/L — ABNORMAL LOW (ref 22–32)
Calcium: 9.2 mg/dL (ref 8.9–10.3)
Chloride: 109 mmol/L (ref 98–111)
Creatinine, Ser: 0.44 mg/dL (ref 0.44–1.00)
GFR calc Af Amer: >60 mL/min (ref >60)
GFR calc non Af Amer: >60 mL/min (ref >60)
Glucose, Bld: 117 mg/dL — ABNORMAL HIGH (ref 70–99)
Potassium: 3 mmol/L — ABNORMAL LOW (ref 3.5–5.1)
Sodium: 131 mmol/L — ABNORMAL LOW (ref 135–145)
Total Bilirubin: 0.5 mg/dL (ref 0.3–1.2)
Total Protein: 7.7 g/dL (ref 6.5–8.1)

## 2019-02-24 LAB — LIPASE, BLOOD: Lipase: 19 U/L (ref 11–51)

## 2019-02-24 LAB — CBG MONITORING, ED: Glucose-Capillary: 98 mg/dL (ref 70–99)

## 2019-02-24 MED ORDER — SODIUM CHLORIDE 0.9 % IV BOLUS
1000.0000 mL | Freq: Once | INTRAVENOUS | Status: AC
  Administered 2019-02-25: 1000 mL via INTRAVENOUS

## 2019-02-24 MED ORDER — METOCLOPRAMIDE HCL 5 MG/ML IJ SOLN
10.0000 mg | Freq: Once | INTRAMUSCULAR | Status: AC
  Administered 2019-02-25: 10 mg via INTRAVENOUS
  Filled 2019-02-24: qty 2

## 2019-02-24 MED ORDER — SODIUM CHLORIDE 0.9% FLUSH
3.0000 mL | Freq: Once | INTRAVENOUS | Status: DC
  Filled 2019-02-24: qty 3

## 2019-02-24 NOTE — ED Triage Notes (Addendum)
Abdominal pain x 4 hours. Vomiting and diarrhea. She is [redacted] weeks pregnant.

## 2019-02-24 NOTE — ED Provider Notes (Signed)
[facility] Provider Note: Lowella Dell, MD, FACEP  CSN: [id number] MRN: [id number] ARRIVAL: 02/24/19 at 2154 ROOM: MH01/MH01   CHIEF COMPLAINT  N/V/D   HISTORY OF PRESENT ILLNESS  02/24/19 11:02 PM Jean Mata is a 28 y.o. female who is about [redacted] weeks pregnant.  She is here with nausea, vomiting and diarrhea along with lower abdominal cramping that began 4 hours prior to arrival.  She rates her abdominal cramping is a 5 out of 10.  She is not aware of having a fever but states she feels hot.  She also states her vision is blurred.   Past Medical History:  Diagnosis Date  . Asthma   . Bipolar disorder (HCC)   . Depression   . Hydrocephalus (HCC)   . Obesity   . Trichimoniasis     Past Surgical History:  Procedure Laterality Date  . BRAIN SURGERY      No family history on file.  Social History   Tobacco Use  . Smoking status: Former Games developer  . Smokeless tobacco: Never Used  Substance Use Topics  . Alcohol use: Yes    Comment: occ  . Drug use: Yes    Types: Marijuana    Comment: everyday    Prior to Admission medications   Medication Sig Start Date End Date Taking? Authorizing Provider  lansoprazole (PREVACID) 30 MG capsule Take 1 capsule (30 mg total) by mouth daily at 12 noon. 12/01/18  Yes Johnte Portnoy, MD  metoCLOPramide (REGLAN) 10 MG tablet Take 1 tablet (10 mg total) by mouth every 6 (six) hours as needed for nausea or vomiting. 12/01/18  Yes Rhylee Nunn, MD    Allergies Strawberry extract   REVIEW OF SYSTEMS  Negative except as noted here or in the History of Present Illness.   PHYSICAL EXAMINATION  Initial Vital Signs Blood pressure 140/83, pulse 90, temperature 98.2 F (36.8 C), temperature source Oral, resp. rate 20, height 6\' 2"  (1.88 m), weight 111.1 kg, last menstrual period 10/11/2018, SpO2 100 %.  Examination General: Well-developed, well-nourished female in no acute distress; appearance consistent with age of  record HENT: normocephalic; atraumatic Eyes: pupils equal, round and reactive to light; extraocular muscles intact Neck: supple Heart: regular rate and rhythm Lungs: clear to auscultation bilaterally; occasional hiccups Abdomen: soft; nondistended; mild lower abdominal tenderness; bowel sounds present Extremities: No deformity; full range of motion; pulses normal Neurologic: Awake, alert and oriented; motor function intact in all extremities and symmetric; no facial droop Skin: Warm and dry Psychiatric: Flat affect   RESULTS  Summary of this visit's results, reviewed by myself:   EKG Interpretation  Date/Time:    Ventricular Rate:    PR Interval:    QRS Duration:   QT Interval:    QTC Calculation:   R Axis:     Text Interpretation:        Laboratory Studies: Results for orders placed or performed during the hospital encounter of 02/24/19 (from the past 24 hour(s))  CBG monitoring, ED     Status: None   Collection Time: 02/24/19 10:16 PM  Result Value Ref Range   Glucose-Capillary 98 70 - 99 mg/dL  Lipase, blood     Status: None   Collection Time: 02/24/19 10:26 PM  Result Value Ref Range   Lipase 19 11 - 51 U/L  Comprehensive metabolic panel     Status: Abnormal   Collection Time: 02/24/19 10:26 PM  Result Value Ref Range   Sodium 131 (L) 135 -  145 mmol/L   Potassium 3.0 (L) 3.5 - 5.1 mmol/L   Chloride 109 98 - 111 mmol/L   CO2 13 (L) 22 - 32 mmol/L   Glucose, Bld 117 (H) 70 - 99 mg/dL   BUN 7 6 - 20 mg/dL   Creatinine, Ser 7.47 0.44 - 1.00 mg/dL   Calcium 9.2 8.9 - 15.9 mg/dL   Total Protein 7.7 6.5 - 8.1 g/dL   Albumin 3.5 3.5 - 5.0 g/dL   AST 17 15 - 41 U/L   ALT 13 0 - 44 U/L   Alkaline Phosphatase 66 38 - 126 U/L   Total Bilirubin 0.5 0.3 - 1.2 mg/dL   GFR calc non Af Amer >60 >60 mL/min   GFR calc Af Amer >60 >60 mL/min   Anion gap 9 5 - 15  CBC     Status: Abnormal   Collection Time: 02/24/19 10:26 PM  Result Value Ref Range   WBC 18.6 (H) 4.0 -  10.5 K/uL   RBC 3.76 (L) 3.87 - 5.11 MIL/uL   Hemoglobin 11.3 (L) 12.0 - 15.0 g/dL   HCT 53.9 (L) 67.2 - 89.7 %   MCV 92.0 80.0 - 100.0 fL   MCH 30.1 26.0 - 34.0 pg   MCHC 32.7 30.0 - 36.0 g/dL   RDW 91.5 04.1 - 36.4 %   Platelets 363 150 - 400 K/uL   nRBC 0.0 0.0 - 0.2 %  Urinalysis, Routine w reflex microscopic     Status: Abnormal   Collection Time: 02/25/19 12:13 AM  Result Value Ref Range   Color, Urine YELLOW YELLOW   APPearance CLOUDY (A) CLEAR   Specific Gravity, Urine 1.020 1.005 - 1.030   pH 8.5 (H) 5.0 - 8.0   Glucose, UA 250 (A) NEGATIVE mg/dL   Hgb urine dipstick TRACE (A) NEGATIVE   Bilirubin Urine NEGATIVE NEGATIVE   Ketones, ur >80 (A) NEGATIVE mg/dL   Protein, ur 30 (A) NEGATIVE mg/dL   Nitrite NEGATIVE NEGATIVE   Leukocytes,Ua TRACE (A) NEGATIVE  Pregnancy, urine     Status: Abnormal   Collection Time: 02/25/19 12:13 AM  Result Value Ref Range   Preg Test, Ur POSITIVE (A) NEGATIVE  Urinalysis, Microscopic (reflex)     Status: Abnormal   Collection Time: 02/25/19 12:13 AM  Result Value Ref Range   RBC / HPF 0-5 0 - 5 RBC/hpf   WBC, UA 21-50 0 - 5 WBC/hpf   Bacteria, UA MANY (A) NONE SEEN   Squamous Epithelial / LPF 21-50 0 - 5   WBC Clumps PRESENT    Mucus PRESENT    Imaging Studies: No results found.  ED COURSE and MDM  Nursing notes and initial vitals signs, including pulse oximetry, reviewed.  Vitals:   02/24/19 2204 02/24/19 2214 02/25/19 0006  BP: 140/83  (!) 163/105  Pulse: 90  99  Resp: 20  (!) 22  Temp: 98.2 F (36.8 C)    TempSrc: Oral    SpO2: 100%  100%  Weight:  111.1 kg   Height:  6\' 2"  (1.88 m)    1:22 AM Drinking fluids without vomiting after IV medications.  Rocephin given for urinary tract infection.  Urine sent for culture.  PROCEDURES    ED DIAGNOSES     ICD-10-CM   1. Viral gastroenteritis A08.4   2. Urinary tract infection in mother during second trimester of pregnancy O23.42        Amairani Shuey, MD 02/25/19  [phone]

## 2019-02-25 LAB — URINALYSIS, ROUTINE W REFLEX MICROSCOPIC
Bilirubin Urine: NEGATIVE
GLUCOSE, UA: 250 mg/dL — AB
Ketones, ur: >80 mg/dL — AB
Nitrite: NEGATIVE
PROTEIN: 30 mg/dL — AB
Specific Gravity, Urine: 1.02 (ref 1.005–1.030)
pH: 8.5 — ABNORMAL HIGH (ref 5.0–8.0)

## 2019-02-25 LAB — URINALYSIS, MICROSCOPIC (REFLEX)

## 2019-02-25 LAB — PREGNANCY, URINE: Preg Test, Ur: POSITIVE — AB

## 2019-02-25 MED ORDER — METOCLOPRAMIDE HCL 10 MG PO TABS: 10.0000 mg | ORAL_TABLET | Freq: Four times a day (QID) | ORAL | 0 refills | Status: DC | PRN

## 2019-02-25 MED ORDER — SODIUM CHLORIDE 0.9 % IV SOLN
INTRAVENOUS | Status: DC | PRN
  Administered 2019-02-25: 1000 mL via INTRAVENOUS

## 2019-02-25 MED ORDER — CEPHALEXIN 500 MG PO CAPS: 500.0000 mg | ORAL_CAPSULE | Freq: Two times a day (BID) | ORAL | 0 refills | Status: DC

## 2019-02-25 MED ORDER — SODIUM CHLORIDE 0.9 % IV SOLN
1.0000 g | Freq: Once | INTRAVENOUS | Status: AC
  Administered 2019-02-25: 1 g via INTRAVENOUS
  Filled 2019-02-25: qty 10

## 2019-02-26 LAB — URINE CULTURE

## 2019-03-03 ENCOUNTER — Other Ambulatory Visit: Payer: Self-pay

## 2019-03-03 ENCOUNTER — Encounter (HOSPITAL_BASED_OUTPATIENT_CLINIC_OR_DEPARTMENT_OTHER): Payer: Self-pay

## 2019-03-03 ENCOUNTER — Emergency Department (HOSPITAL_BASED_OUTPATIENT_CLINIC_OR_DEPARTMENT_OTHER): Payer: BLUE CROSS/BLUE SHIELD

## 2019-03-03 ENCOUNTER — Inpatient Hospital Stay (HOSPITAL_BASED_OUTPATIENT_CLINIC_OR_DEPARTMENT_OTHER)
Admission: AD | Admit: 2019-03-03 | Discharge: 2019-03-09 | DRG: 833 | Disposition: A | Discharge status: Home / Self Care | Payer: BLUE CROSS/BLUE SHIELD | Attending: Obstetrics & Gynecology | Admitting: Obstetrics & Gynecology

## 2019-03-03 DIAGNOSIS — R7989 Other specified abnormal findings of blood chemistry: Secondary | ICD-10-CM | POA: Diagnosis present

## 2019-03-03 DIAGNOSIS — O10012 Pre-existing essential hypertension complicating pregnancy, second trimester: Secondary | ICD-10-CM | POA: Diagnosis present

## 2019-03-03 DIAGNOSIS — K219 Gastro-esophageal reflux disease without esophagitis: Secondary | ICD-10-CM | POA: Diagnosis present

## 2019-03-03 DIAGNOSIS — R945 Abnormal results of liver function studies: Secondary | ICD-10-CM

## 2019-03-03 DIAGNOSIS — R112 Nausea with vomiting, unspecified: Secondary | ICD-10-CM

## 2019-03-03 DIAGNOSIS — E876 Hypokalemia: Secondary | ICD-10-CM

## 2019-03-03 DIAGNOSIS — O112 Pre-existing hypertension with pre-eclampsia, second trimester: Secondary | ICD-10-CM | POA: Diagnosis not present

## 2019-03-03 DIAGNOSIS — O0930 Supervision of pregnancy with insufficient antenatal care, unspecified trimester: Secondary | ICD-10-CM

## 2019-03-03 DIAGNOSIS — O0932 Supervision of pregnancy with insufficient antenatal care, second trimester: Secondary | ICD-10-CM

## 2019-03-03 DIAGNOSIS — O99282 Endocrine, nutritional and metabolic diseases complicating pregnancy, second trimester: Secondary | ICD-10-CM | POA: Diagnosis present

## 2019-03-03 DIAGNOSIS — O119 Pre-existing hypertension with pre-eclampsia, unspecified trimester: Secondary | ICD-10-CM | POA: Diagnosis present

## 2019-03-03 DIAGNOSIS — R1013 Epigastric pain: Secondary | ICD-10-CM

## 2019-03-03 DIAGNOSIS — O99612 Diseases of the digestive system complicating pregnancy, second trimester: Secondary | ICD-10-CM | POA: Diagnosis present

## 2019-03-03 DIAGNOSIS — Z3A26 26 weeks gestation of pregnancy: Secondary | ICD-10-CM

## 2019-03-03 DIAGNOSIS — Z87891 Personal history of nicotine dependence: Secondary | ICD-10-CM

## 2019-03-03 DIAGNOSIS — R1110 Vomiting, unspecified: Secondary | ICD-10-CM

## 2019-03-03 LAB — COMPREHENSIVE METABOLIC PANEL
ALT: 301 U/L — AB (ref 0–44)
AST: 191 U/L — ABNORMAL HIGH (ref 15–41)
Albumin: 3.8 g/dL (ref 3.5–5.0)
Alkaline Phosphatase: 144 U/L — ABNORMAL HIGH (ref 38–126)
Anion gap: 14 (ref 5–15)
BUN: 13 mg/dL (ref 6–20)
CO2: 21 mmol/L — ABNORMAL LOW (ref 22–32)
Calcium: 9.4 mg/dL (ref 8.9–10.3)
Chloride: 96 mmol/L — ABNORMAL LOW (ref 98–111)
Creatinine, Ser: 0.8 mg/dL (ref 0.44–1.00)
GFR calc Af Amer: >60 mL/min (ref >60)
GFR calc non Af Amer: >60 mL/min (ref >60)
Glucose, Bld: 105 mg/dL — ABNORMAL HIGH (ref 70–99)
Potassium: 2.3 mmol/L — CL (ref 3.5–5.1)
Sodium: 131 mmol/L — ABNORMAL LOW (ref 135–145)
Total Bilirubin: 1.9 mg/dL — ABNORMAL HIGH (ref 0.3–1.2)
Total Protein: 8.4 g/dL — ABNORMAL HIGH (ref 6.5–8.1)

## 2019-03-03 LAB — CBC WITH DIFFERENTIAL/PLATELET
Abs Immature Granulocytes: 0.11 10*3/uL — ABNORMAL HIGH (ref 0.00–0.07)
BASOS ABS: 0 10*3/uL (ref 0.0–0.1)
Basophils Relative: 0 %
Eosinophils Absolute: 0.1 10*3/uL (ref 0.0–0.5)
Eosinophils Relative: 0 %
HCT: 41.2 % (ref 36.0–46.0)
Hemoglobin: 14.3 g/dL (ref 12.0–15.0)
Immature Granulocytes: 1 %
Lymphocytes Relative: 23 %
Lymphs Abs: 3.8 10*3/uL (ref 0.7–4.0)
MCH: 30.2 pg (ref 26.0–34.0)
MCHC: 34.7 g/dL (ref 30.0–36.0)
MCV: 86.9 fL (ref 80.0–100.0)
Monocytes Absolute: 1.1 10*3/uL — ABNORMAL HIGH (ref 0.1–1.0)
Monocytes Relative: 7 %
NRBC: 0 % (ref 0.0–0.2)
Neutro Abs: 11 10*3/uL — ABNORMAL HIGH (ref 1.7–7.7)
Neutrophils Relative %: 69 %
Platelets: 401 10*3/uL — ABNORMAL HIGH (ref 150–400)
RBC: 4.74 MIL/uL (ref 3.87–5.11)
RDW: 13.1 % (ref 11.5–15.5)
WBC: 16.1 10*3/uL — AB (ref 4.0–10.5)

## 2019-03-03 LAB — LIPASE, BLOOD: Lipase: 41 U/L (ref 11–51)

## 2019-03-03 MED ORDER — SODIUM CHLORIDE 0.9 % IV BOLUS
1000.0000 mL | Freq: Once | INTRAVENOUS | Status: AC
  Administered 2019-03-03: 1000 mL via INTRAVENOUS

## 2019-03-03 MED ORDER — POTASSIUM CHLORIDE 10 MEQ/100ML IV SOLN
10.0000 meq | INTRAVENOUS | Status: AC
  Administered 2019-03-03 (×2): 10 meq via INTRAVENOUS
  Filled 2019-03-03 (×2): qty 100

## 2019-03-03 MED ORDER — FAMOTIDINE IN NACL 20-0.9 MG/50ML-% IV SOLN
20.0000 mg | Freq: Once | INTRAVENOUS | Status: AC
  Administered 2019-03-03: 20 mg via INTRAVENOUS
  Filled 2019-03-03: qty 50

## 2019-03-03 MED ORDER — ONDANSETRON HCL 4 MG/2ML IJ SOLN
4.0000 mg | Freq: Once | INTRAMUSCULAR | Status: AC
  Administered 2019-03-03: 4 mg via INTRAVENOUS
  Filled 2019-03-03: qty 2

## 2019-03-03 MED ORDER — POTASSIUM CHLORIDE CRYS ER 20 MEQ PO TBCR
40.0000 meq | EXTENDED_RELEASE_TABLET | Freq: Once | ORAL | Status: AC
  Administered 2019-03-03: 40 meq via ORAL
  Filled 2019-03-03: qty 2

## 2019-03-03 MED ORDER — ALUM & MAG HYDROXIDE-SIMETH 200-200-20 MG/5ML PO SUSP
15.0000 mL | Freq: Once | ORAL | Status: AC
  Administered 2019-03-03: 15 mL via ORAL
  Filled 2019-03-03: qty 30

## 2019-03-03 NOTE — ED Notes (Signed)
carelink delayed due to other pt needing critical transport. Pt informed and provided with snack. Pt resting on stretcher.

## 2019-03-03 NOTE — ED Notes (Signed)
Date and time results received: 03/03/19 2037 (use smartphrase ".now" to insert current time)  Test: potassium Critical Value: 2.3  Name of Provider Notified: Dr. Fredderick Phenix  Orders Received? Or Actions Taken?: awaiting further orders

## 2019-03-03 NOTE — ED Notes (Signed)
Urine sample at bedside if needed  

## 2019-03-03 NOTE — ED Notes (Signed)
Pt placed on TOCO- spoke with OB rapid response to begin monitoring.

## 2019-03-03 NOTE — ED Notes (Signed)
ED Provider at bedside. 

## 2019-03-03 NOTE — Progress Notes (Signed)
Pt is a G2P1, estimated fetal age 27 weeks at [facility] with nausea, vomiting and diarrhea.   Discussed with Dr Debroah Loop, Faculty Practice Attending. OK to doppler fetal heart tones. OB cleared unless change in status.

## 2019-03-03 NOTE — ED Triage Notes (Addendum)
Pt seen last week, states she is unable to eat anything or hold any fluids down. Pt eating ice in waiting room, unable to get temperature on. Pt has not established self with OB care, is [redacted] weeks pregnant, no abdominal cramping, no bleeding, c/o small amount of discharge

## 2019-03-03 NOTE — ED Notes (Signed)
Pt placed on cardiac monitor 

## 2019-03-03 NOTE — ED Notes (Addendum)
Per pt- pt unsure how far along due to LMP in september but last ultrasound was in November and told she was further. Pt has not received any OB care. Pt reports not being able to hold anything down and not eating due to financial hardship. Pt denies abdominal pain/ N/V. Does report increased acid reflux.

## 2019-03-03 NOTE — ED Notes (Signed)
Patient transported to Ultrasound 

## 2019-03-03 NOTE — ED Notes (Signed)
carelink arrived to transfer pt to MAU.

## 2019-03-03 NOTE — ED Provider Notes (Signed)
[facility] EMERGENCY DEPARTMENT Provider Note   CSN: [id number] Arrival date & time: 03/03/19  1840    History   Chief Complaint Chief Complaint  Patient presents with  . Emesis During Pregnancy    HPI Jean Mata is a 28 y.o. female.     Patient is a 28 year old female who presents with vomiting.  She is currently pregnant.  She states she is [redacted] weeks pregnant although she had an ultrasound in November and based on that she would be [redacted] weeks pregnant.  This ultrasound showed a EDC of 06/09/2019.  She currently has received no prenatal care.  She states that she has been having a lot of acid reflux and burning in her throat.  She states she is not really able to eat anything.  When she eats she throws it up.  She attributes this to the acid reflux.  She says she is not really having normal bowel movements but no diarrhea.  No fevers.  No abdominal pain.  No vaginal bleeding or leakage of fluid.  This is her first pregnancy.     Past Medical History:  Diagnosis Date  . Asthma   . Bipolar disorder (HCC)   . Depression   . Hydrocephalus (HCC)   . Obesity   . Trichimoniasis     There are no active problems to display for this patient.   Past Surgical History:  Procedure Laterality Date  . BRAIN SURGERY       OB History    Gravida  1   Para      Term      Preterm      AB      Living        SAB      TAB      Ectopic      Multiple      Live Births               Home Medications    Prior to Admission medications   Medication Sig Start Date End Date Taking? Authorizing Provider  cephALEXin (KEFLEX) 500 MG capsule Take 1 capsule (500 mg total) by mouth 2 (two) times daily. 02/25/19   Molpus, Jonny Ruiz, MD  metoCLOPramide (REGLAN) 10 MG tablet Take 1 tablet (10 mg total) by mouth every 6 (six) hours as needed for nausea or vomiting. 02/25/19   Molpus, Jonny Ruiz, MD    Family History No family history on file.  Social History Social History    Tobacco Use  . Smoking status: Former Games developer  . Smokeless tobacco: Never Used  Substance Use Topics  . Alcohol use: Yes    Comment: occ  . Drug use: Yes    Types: Marijuana    Comment: everyday     Allergies   Strawberry extract   Review of Systems Review of Systems  Constitutional: Negative for chills, diaphoresis, fatigue and fever.  HENT: Negative for congestion, rhinorrhea and sneezing.   Eyes: Negative.   Respiratory: Negative for cough, chest tightness and shortness of breath.   Cardiovascular: Negative for chest pain and leg swelling.  Gastrointestinal: Positive for nausea and vomiting. Negative for abdominal pain, blood in stool and diarrhea.  Genitourinary: Negative for difficulty urinating, flank pain, frequency and hematuria.  Musculoskeletal: Negative for arthralgias and back pain.  Skin: Negative for rash.  Neurological: Negative for dizziness, speech difficulty, weakness, numbness and headaches.     Physical Exam Updated Vital Signs BP (!) 128/99   Pulse Marland Kitchen)  125   Resp 17   Ht  (1.88 m)   Wt 111.6 kg   LMP 10/11/2018 (Approximate)   SpO2 100%   BMI 31.58 kg/m   Physical Exam Constitutional:      Appearance: She is well-developed.  HENT:     Head: Normocephalic and atraumatic.  Eyes:     Pupils: Pupils are equal, round, and reactive to light.  Neck:     Musculoskeletal: Normal range of motion and neck supple.  Cardiovascular:     Rate and Rhythm: Normal rate and regular rhythm.     Heart sounds: Normal heart sounds.  Pulmonary:     Effort: Pulmonary effort is normal. No respiratory distress.     Breath sounds: Normal breath sounds. No wheezing or rales.  Chest:     Chest wall: No tenderness.  Abdominal:     General: Bowel sounds are normal.     Palpations: Abdomen is soft.     Tenderness: There is no abdominal tenderness. There is no guarding or rebound.     Comments: Gravid uterus with fundal height 2 cm above the umbilicus.   Musculoskeletal: Normal range of motion.  Lymphadenopathy:     Cervical: No cervical adenopathy.  Skin:    General: Skin is warm and dry.     Findings: No rash.  Neurological:     Mental Status: She is alert and oriented to person, place, and time.      ED Treatments / Results  Labs (all labs ordered are listed, but only abnormal results are displayed) Labs Reviewed  COMPREHENSIVE METABOLIC PANEL - Abnormal; Notable for the following components:      Result Value   Sodium 131 (*)    Potassium 2.3 (*)    Chloride 96 (*)    CO2 21 (*)    Glucose, Bld 105 (*)    Total Protein 8.4 (*)    AST 191 (*)    ALT 301 (*)    Alkaline Phosphatase 144 (*)    Total Bilirubin 1.9 (*)    All other components within normal limits  CBC WITH DIFFERENTIAL/PLATELET - Abnormal; Notable for the following components:   WBC 16.1 (*)    Platelets 401 (*)    Neutro Abs 11.0 (*)    Monocytes Absolute 1.1 (*)    Abs Immature Granulocytes 0.11 (*)    All other components within normal limits  LIPASE, BLOOD    EKG EKG Interpretation  Date/Time:  Monday March 03 2019 22:49:58 EDT Ventricular Rate:  112 PR Interval:    QRS Duration: 84 QT Interval:  421 QTC Calculation: 575 R Axis:   79 Text Interpretation:  Sinus tachycardia Prolonged QT interval Confirmed by Rolan Bucco [id number]) on 03/03/2019 10:53:58 PM   Radiology US Abdomen Limited Ruq  Result Date: 03/03/2019 CLINICAL DATA:  Epigastric pain for 1 week. Twenty-four weeks pregnant. EXAM: ULTRASOUND ABDOMEN LIMITED RIGHT UPPER QUADRANT COMPARISON:  None. FINDINGS: Gallbladder: Small amount of dependent sludge. Tiny, 3 mm dependent stone. No wall thickening. No pericholecystic fluid. Common bile duct: Diameter: 4 mm Liver: No focal lesion identified. Within normal limits in parenchymal echogenicity. Portal vein is patent on color Doppler imaging with normal direction of blood flow towards the liver. IMPRESSION: 1. No acute findings. 2. Tiny  gallstone and small amount of dependent gallbladder sludge. No acute cholecystitis. No bile duct dilation. Electronically Signed   By: Amie Portland M.D.   On: 03/03/2019 22:34    Procedures Procedures (including  critical care time)  Medications Ordered in ED Medications  potassium chloride 10 mEq in 100 mL IVPB (10 mEq Intravenous New Bag/Given 03/03/19 2203)  famotidine (PEPCID) IVPB 20 mg premix (0 mg Intravenous Stopped 03/03/19 2028)  ondansetron (ZOFRAN) injection 4 mg (4 mg Intravenous Given 03/03/19 1928)  sodium chloride 0.9 % bolus 1,000 mL (0 mLs Intravenous Stopped 03/03/19 2113)  alum & mag hydroxide-simeth (MAALOX/MYLANTA) 200-200-20 MG/5ML suspension 15 mL (15 mLs Oral Given 03/03/19 2020)  potassium chloride SA (K-DUR,KLOR-CON) CR tablet 40 mEq (40 mEq Oral Given 03/03/19 2044)  sodium chloride 0.9 % bolus 1,000 mL (0 mLs Intravenous Stopped 03/03/19 2204)     Initial Impression / Assessment and Plan / ED Course  I have reviewed the triage vital signs and the nursing notes.  Pertinent labs & imaging results that were available during my care of the patient were reviewed by me and considered in my medical decision making (see chart for details).        Patient is a 28 year old female who presents with reflux symptoms and vomiting.  She does not have any significant abdominal pain.  Her potassium is markedly low at 2.3.  She was started on potassium replacement both IV and oral.  Her nausea and reflux has improved after treatment in the ED with Pepcid and Zofran.  She was given IV fluids.  After 2 L her heart rate is still elevated in the 120s.  She does have an elevation in her LFTs.  A right upper quadrant ultrasound was performed which shows a tiny gallstone but no common bile duct dilatation.  No wall thickening or other signs of cholecystitis.  I spoke with Dr. Debroah Loop with OB/GYN who [facility]  Final Clinical  Impressions(s) / ED Diagnoses   Final diagnoses:  Epigastric pain  Non-intractable vomiting with nausea, unspecified vomiting type  Hypokalemia    ED Discharge Orders    None       Rolan Bucco, MD 03/03/19 2254

## 2019-03-04 ENCOUNTER — Encounter (HOSPITAL_COMMUNITY): Payer: Self-pay

## 2019-03-04 ENCOUNTER — Inpatient Hospital Stay (HOSPITAL_COMMUNITY): Payer: BLUE CROSS/BLUE SHIELD

## 2019-03-04 DIAGNOSIS — O99612 Diseases of the digestive system complicating pregnancy, second trimester: Secondary | ICD-10-CM | POA: Diagnosis present

## 2019-03-04 DIAGNOSIS — Z363 Encounter for antenatal screening for malformations: Secondary | ICD-10-CM | POA: Diagnosis not present

## 2019-03-04 DIAGNOSIS — R7989 Other specified abnormal findings of blood chemistry: Secondary | ICD-10-CM | POA: Diagnosis present

## 2019-03-04 DIAGNOSIS — O0932 Supervision of pregnancy with insufficient antenatal care, second trimester: Secondary | ICD-10-CM | POA: Diagnosis not present

## 2019-03-04 DIAGNOSIS — O0933 Supervision of pregnancy with insufficient antenatal care, third trimester: Secondary | ICD-10-CM

## 2019-03-04 DIAGNOSIS — O119 Pre-existing hypertension with pre-eclampsia, unspecified trimester: Secondary | ICD-10-CM | POA: Diagnosis present

## 2019-03-04 DIAGNOSIS — R112 Nausea with vomiting, unspecified: Secondary | ICD-10-CM | POA: Diagnosis not present

## 2019-03-04 DIAGNOSIS — K219 Gastro-esophageal reflux disease without esophagitis: Secondary | ICD-10-CM | POA: Diagnosis present

## 2019-03-04 DIAGNOSIS — Z3A26 26 weeks gestation of pregnancy: Secondary | ICD-10-CM | POA: Diagnosis not present

## 2019-03-04 DIAGNOSIS — E876 Hypokalemia: Secondary | ICD-10-CM

## 2019-03-04 DIAGNOSIS — O112 Pre-existing hypertension with pre-eclampsia, second trimester: Secondary | ICD-10-CM | POA: Diagnosis present

## 2019-03-04 DIAGNOSIS — R1110 Vomiting, unspecified: Secondary | ICD-10-CM | POA: Diagnosis not present

## 2019-03-04 DIAGNOSIS — R945 Abnormal results of liver function studies: Secondary | ICD-10-CM | POA: Diagnosis not present

## 2019-03-04 DIAGNOSIS — O113 Pre-existing hypertension with pre-eclampsia, third trimester: Secondary | ICD-10-CM

## 2019-03-04 DIAGNOSIS — K210 Gastro-esophageal reflux disease with esophagitis: Secondary | ICD-10-CM | POA: Diagnosis not present

## 2019-03-04 DIAGNOSIS — O10012 Pre-existing essential hypertension complicating pregnancy, second trimester: Secondary | ICD-10-CM | POA: Diagnosis present

## 2019-03-04 DIAGNOSIS — O99282 Endocrine, nutritional and metabolic diseases complicating pregnancy, second trimester: Secondary | ICD-10-CM | POA: Diagnosis present

## 2019-03-04 DIAGNOSIS — O0930 Supervision of pregnancy with insufficient antenatal care, unspecified trimester: Secondary | ICD-10-CM

## 2019-03-04 DIAGNOSIS — Z87891 Personal history of nicotine dependence: Secondary | ICD-10-CM | POA: Diagnosis not present

## 2019-03-04 LAB — CBC
HCT: 34.2 % — ABNORMAL LOW (ref 36.0–46.0)
Hemoglobin: 12.2 g/dL (ref 12.0–15.0)
MCH: 30.7 pg (ref 26.0–34.0)
MCHC: 35.7 g/dL (ref 30.0–36.0)
MCV: 85.9 fL (ref 80.0–100.0)
Platelets: 344 10*3/uL (ref 150–400)
RBC: 3.98 MIL/uL (ref 3.87–5.11)
RDW: 13.2 % (ref 11.5–15.5)
WBC: 15.1 10*3/uL — ABNORMAL HIGH (ref 4.0–10.5)
nRBC: 0 % (ref 0.0–0.2)

## 2019-03-04 LAB — COMPREHENSIVE METABOLIC PANEL
ALT: 299 U/L — ABNORMAL HIGH (ref 0–44)
AST: 189 U/L — ABNORMAL HIGH (ref 15–41)
Albumin: 3 g/dL — ABNORMAL LOW (ref 3.5–5.0)
Alkaline Phosphatase: 119 U/L (ref 38–126)
Anion gap: 9 (ref 5–15)
BUN: 8 mg/dL (ref 6–20)
CO2: 24 mmol/L (ref 22–32)
Calcium: 8.9 mg/dL (ref 8.9–10.3)
Chloride: 100 mmol/L (ref 98–111)
Creatinine, Ser: 0.68 mg/dL (ref 0.44–1.00)
GFR calc Af Amer: >60 mL/min (ref >60)
GFR calc non Af Amer: >60 mL/min (ref >60)
Glucose, Bld: 122 mg/dL — ABNORMAL HIGH (ref 70–99)
Potassium: 2.4 mmol/L — CL (ref 3.5–5.1)
Sodium: 133 mmol/L — ABNORMAL LOW (ref 135–145)
Total Bilirubin: 1.9 mg/dL — ABNORMAL HIGH (ref 0.3–1.2)
Total Protein: 6.8 g/dL (ref 6.5–8.1)

## 2019-03-04 LAB — AMYLASE: Amylase: 85 U/L (ref 28–100)

## 2019-03-04 LAB — HEMOGLOBIN A1C
HEMOGLOBIN A1C: 4.9 % (ref 4.8–5.6)
Mean Plasma Glucose: 93.93 mg/dL

## 2019-03-04 LAB — HIV ANTIBODY (ROUTINE TESTING W REFLEX): HIV Screen 4th Generation wRfx: NONREACTIVE

## 2019-03-04 LAB — PROTEIN / CREATININE RATIO, URINE
CREATININE, URINE: 221.8 mg/dL
Protein Creatinine Ratio: 0.23 mg/mg{Cre} — ABNORMAL HIGH (ref 0.00–0.15)
Total Protein, Urine: 50 mg/dL

## 2019-03-04 LAB — ABO/RH: ABO/RH(D): O POS

## 2019-03-04 LAB — RPR: RPR Ser Ql: NONREACTIVE

## 2019-03-04 LAB — LIPASE, BLOOD: Lipase: 40 U/L (ref 11–51)

## 2019-03-04 LAB — TYPE AND SCREEN
ABO/RH(D): O POS
Antibody Screen: NEGATIVE

## 2019-03-04 LAB — RAPID URINE DRUG SCREEN, HOSP PERFORMED
Amphetamines: NOT DETECTED
BENZODIAZEPINES: NOT DETECTED
Barbiturates: NOT DETECTED
Cocaine: NOT DETECTED
Opiates: NOT DETECTED
Tetrahydrocannabinol: POSITIVE — AB

## 2019-03-04 LAB — HEPATITIS B SURFACE ANTIGEN: Hepatitis B Surface Ag: NEGATIVE

## 2019-03-04 MED ORDER — SODIUM CHLORIDE 0.9% FLUSH
3.0000 mL | Freq: Two times a day (BID) | INTRAVENOUS | Status: DC
  Administered 2019-03-04 – 2019-03-09 (×7): 3 mL via INTRAVENOUS

## 2019-03-04 MED ORDER — DOCUSATE SODIUM 100 MG PO CAPS
100.0000 mg | ORAL_CAPSULE | Freq: Every day | ORAL | Status: DC
  Administered 2019-03-04 – 2019-03-08 (×5): 100 mg via ORAL
  Filled 2019-03-04 (×6): qty 1

## 2019-03-04 MED ORDER — LABETALOL HCL 5 MG/ML IV SOLN: 20.0000 mg | INTRAVENOUS | Status: DC | PRN

## 2019-03-04 MED ORDER — CALCIUM CARBONATE ANTACID 500 MG PO CHEW
2.0000 | CHEWABLE_TABLET | ORAL | Status: DC | PRN
  Administered 2019-03-05 (×2): 400 mg via ORAL
  Filled 2019-03-04 (×2): qty 2

## 2019-03-04 MED ORDER — PRENATAL MULTIVITAMIN CH
1.0000 | ORAL_TABLET | Freq: Every day | ORAL | Status: DC
  Administered 2019-03-04: 1 via ORAL
  Filled 2019-03-04: qty 1

## 2019-03-04 MED ORDER — GLYCOPYRROLATE 0.2 MG/ML IJ SOLN: 0.1000 mg | Freq: Three times a day (TID) | INTRAMUSCULAR | Status: DC

## 2019-03-04 MED ORDER — POTASSIUM CHLORIDE CRYS ER 20 MEQ PO TBCR: 40.0000 meq | EXTENDED_RELEASE_TABLET | Freq: Every day | ORAL | Status: DC

## 2019-03-04 MED ORDER — ACETAMINOPHEN 325 MG PO TABS: 650.0000 mg | ORAL_TABLET | ORAL | Status: DC | PRN

## 2019-03-04 MED ORDER — BETAMETHASONE SOD PHOS & ACET 6 (3-3) MG/ML IJ SUSP
12.0000 mg | INTRAMUSCULAR | Status: AC
  Administered 2019-03-05: 12 mg via INTRAMUSCULAR
  Filled 2019-03-04 (×2): qty 2

## 2019-03-04 MED ORDER — POTASSIUM CHLORIDE 10 MEQ/100ML IV SOLN
10.0000 meq | INTRAVENOUS | Status: AC
  Administered 2019-03-04 (×4): 10 meq via INTRAVENOUS
  Filled 2019-03-04 (×4): qty 100

## 2019-03-04 MED ORDER — SODIUM CHLORIDE 0.9% FLUSH
3.0000 mL | INTRAVENOUS | Status: DC | PRN
  Administered 2019-03-06: 3 mL via INTRAVENOUS
  Filled 2019-03-04: qty 3

## 2019-03-04 MED ORDER — ZOLPIDEM TARTRATE 5 MG PO TABS
5.0000 mg | ORAL_TABLET | Freq: Every evening | ORAL | Status: DC | PRN
  Administered 2019-03-05 – 2019-03-06 (×2): 5 mg via ORAL
  Filled 2019-03-04 (×2): qty 1

## 2019-03-04 MED ORDER — GLYCOPYRROLATE 0.2 MG/ML IJ SOLN
0.1000 mg | Freq: Three times a day (TID) | INTRAMUSCULAR | Status: DC | PRN
  Administered 2019-03-04 – 2019-03-05 (×2): 0.1 mg via INTRAVENOUS
  Filled 2019-03-04 (×2): qty 1

## 2019-03-04 MED ORDER — METOCLOPRAMIDE HCL 10 MG PO TABS
10.0000 mg | ORAL_TABLET | Freq: Four times a day (QID) | ORAL | Status: DC | PRN
  Administered 2019-03-05: 10 mg via ORAL
  Filled 2019-03-04: qty 1

## 2019-03-04 MED ORDER — LABETALOL HCL 5 MG/ML IV SOLN: 80.0000 mg | INTRAVENOUS | Status: DC | PRN

## 2019-03-04 MED ORDER — SODIUM CHLORIDE 0.9 % IV SOLN
250.0000 mL | INTRAVENOUS | Status: DC | PRN
  Administered 2019-03-04: 250 mL via INTRAVENOUS

## 2019-03-04 MED ORDER — LABETALOL HCL 5 MG/ML IV SOLN: 40.0000 mg | INTRAVENOUS | Status: DC | PRN

## 2019-03-04 MED ORDER — PANTOPRAZOLE SODIUM 40 MG PO TBEC
40.0000 mg | DELAYED_RELEASE_TABLET | Freq: Every day | ORAL | Status: DC
  Administered 2019-03-04 – 2019-03-06 (×3): 40 mg via ORAL
  Filled 2019-03-04 (×3): qty 1

## 2019-03-04 MED ORDER — BETAMETHASONE SOD PHOS & ACET 6 (3-3) MG/ML IJ SUSP
12.0000 mg | INTRAMUSCULAR | Status: DC
  Administered 2019-03-04: 12 mg via INTRAMUSCULAR
  Filled 2019-03-04 (×4): qty 2

## 2019-03-04 MED ORDER — HYDRALAZINE HCL 20 MG/ML IJ SOLN: 10.0000 mg | INTRAMUSCULAR | Status: DC | PRN

## 2019-03-04 NOTE — Progress Notes (Signed)
Patient reported to RN that a few days ago she had a sudden loss of vision and felt like she was convulsing, with her head and arms shaking. She did not lose consciousness. She laid in bed for a few minutes and symptoms went away. She has not had any symptoms of this since.

## 2019-03-04 NOTE — Progress Notes (Signed)
CRITICAL VALUE STICKER  CRITICAL VALUE: Potassium 2.4  RECEIVER (on-site recipient of call): Tia Masker, RN  DATE & TIME NOTIFIED: 03/04/2019 0650  MESSENGER (representative from lab):  MD NOTIFIED: Dr. Vear Clock  TIME OF NOTIFICATION: 03/04/2019 5093  RESPONSE: Continue to monitor

## 2019-03-04 NOTE — MAU Note (Signed)
Transferred from [facility] for N/V.   States she found out she was pregnant in December.  Wasn't planning on getting [facility] but "guesses she will now."  She was at the hospital a week ago for the same symptoms and came back because they were unrelieved.  Took pepto bismol for her symptoms at home without relief.  States she is feeling mildly better after the medications at [facility]-now unsure whether she feels better or is just hungry.  No VB/discharge.  Not feeling any ctx or abdominal pain.

## 2019-03-04 NOTE — H&P (Signed)
Jean Mata is a 28 y.o. G1P0 at 26.1 weeks by early [place] who presents via transfer from [facility].  Patient was being evaluated for N/V and was transferred for continuation of care.  Upon arrival, labs reviewed and significant elevation in LFTs noted with normal range 8 days prior. Patient reports spotty vision, but denies HA, RUQ pain, and edema.  Patient reports that she has chronic hypertension, but does not take any medications currently.  Patient has not received any PNC during this pregnancy.    OB History    Gravida  1   Para      Term      Preterm      AB      Living        SAB      TAB      Ectopic      Multiple      Live Births             Past Medical History:  Diagnosis Date  . Asthma   . Bipolar disorder (HCC)   . Depression   . Hydrocephalus (HCC)   . Obesity   . Trichimoniasis    Past Surgical History:  Procedure Laterality Date  . BRAIN SURGERY     Family History: family history is not on file. Social History:  reports that she has quit smoking. She has never used smokeless tobacco. She reports previous alcohol use. She reports current drug use. Drug: Marijuana.     Maternal Diabetes: Unknown Genetic Screening: None Maternal Ultrasounds/Referrals: None Fetal Ultrasounds or other Referrals:  None Maternal Substance Abuse:  Unknown Significant Maternal Medications:  None Significant Maternal Lab Results:  Lab values include: Other:  Other Comments:  None  Review of Systems  Constitutional: Negative for chills and fever.  Eyes: Positive for photophobia.  Respiratory: Negative for shortness of breath.   Gastrointestinal: Positive for nausea and vomiting. Negative for abdominal pain, constipation and diarrhea.  Neurological: Negative for dizziness and headaches.   Maternal Medical History:  Reason for admission: Nausea.      Results for orders placed or performed during the hospital encounter of 03/03/19 (from the past 24  hour(s))  Comprehensive metabolic panel     Status: Abnormal   Collection Time: 03/03/19  7:46 PM  Result Value Ref Range   Sodium 131 (L) 135 - 145 mmol/L   Potassium 2.3 (LL) 3.5 - 5.1 mmol/L   Chloride 96 (L) 98 - 111 mmol/L   CO2 21 (L) 22 - 32 mmol/L   Glucose, Bld 105 (H) 70 - 99 mg/dL   BUN 13 6 - 20 mg/dL   Creatinine, Ser 4.43 0.44 - 1.00 mg/dL   Calcium 9.4 8.9 - 15.4 mg/dL   Total Protein 8.4 (H) 6.5 - 8.1 g/dL   Albumin 3.8 3.5 - 5.0 g/dL   AST 008 (H) 15 - 41 U/L   ALT 301 (H) 0 - 44 U/L   Alkaline Phosphatase 144 (H) 38 - 126 U/L   Total Bilirubin 1.9 (H) 0.3 - 1.2 mg/dL   GFR calc non Af Amer >60 >60 mL/min   GFR calc Af Amer >60 >60 mL/min   Anion gap 14 5 - 15  CBC with Differential     Status: Abnormal   Collection Time: 03/03/19  7:46 PM  Result Value Ref Range   WBC 16.1 (H) 4.0 - 10.5 K/uL   RBC 4.74 3.87 - 5.11 MIL/uL   Hemoglobin 14.3 12.0 -  15.0 g/dL   HCT 16.1 09.6 - 04.5 %   MCV 86.9 80.0 - 100.0 fL   MCH 30.2 26.0 - 34.0 pg   MCHC 34.7 30.0 - 36.0 g/dL   RDW 40.9 81.1 - 91.4 %   Platelets 401 (H) 150 - 400 K/uL   nRBC 0.0 0.0 - 0.2 %   Neutrophils Relative % 69 %   Neutro Abs 11.0 (H) 1.7 - 7.7 K/uL   Lymphocytes Relative 23 %   Lymphs Abs 3.8 0.7 - 4.0 K/uL   Monocytes Relative 7 %   Monocytes Absolute 1.1 (H) 0.1 - 1.0 K/uL   Eosinophils Relative 0 %   Eosinophils Absolute 0.1 0.0 - 0.5 K/uL   Basophils Relative 0 %   Basophils Absolute 0.0 0.0 - 0.1 K/uL   Immature Granulocytes 1 %   Abs Immature Granulocytes 0.11 (H) 0.00 - 0.07 K/uL  Lipase, blood     Status: None   Collection Time: 03/03/19  7:46 PM  Result Value Ref Range   Lipase 41 11 - 51 U/L     Blood pressure (!) 137/93, pulse (!) 113, temperature 98.5 F (36.9 C), resp. rate 20, height  (1.88 m), weight 111.6 kg, last menstrual period 10/11/2018, SpO2 100 %.  Physical Exam  Constitutional: She is oriented to person, place, and time. She appears well-developed and  well-nourished. No distress.  HENT:  Head: Normocephalic and atraumatic.  Eyes: Conjunctivae are normal.  Neck: Normal range of motion.  Cardiovascular: Normal rate, regular rhythm, normal heart sounds and intact distal pulses.  Respiratory: Effort normal and breath sounds normal.  GI: Soft. Bowel sounds are normal. There is no abdominal tenderness.  Musculoskeletal: Normal range of motion.        General: No edema.  Neurological: She is alert and oriented to person, place, and time.  Reflex Scores:      Brachioradialis reflexes are 2+ on the right side and 2+ on the left side. Skin: Skin is warm and dry.  Psychiatric: She has a normal mood and affect. Her behavior is normal.    Prenatal labs: To be Collected and Pending ABO, Rh:   Antibody:   Rubella:   RPR:    HBsAg:    HIV:    GBS:     Results for orders placed or performed during the hospital encounter of 03/03/19 (from the past 24 hour(s))  Comprehensive metabolic panel     Status: Abnormal   Collection Time: 03/03/19  7:46 PM  Result Value Ref Range   Sodium 131 (L) 135 - 145 mmol/L   Potassium 2.3 (LL) 3.5 - 5.1 mmol/L   Chloride 96 (L) 98 - 111 mmol/L   CO2 21 (L) 22 - 32 mmol/L   Glucose, Bld 105 (H) 70 - 99 mg/dL   BUN 13 6 - 20 mg/dL   Creatinine, Ser 7.82 0.44 - 1.00 mg/dL   Calcium 9.4 8.9 - 95.6 mg/dL   Total Protein 8.4 (H) 6.5 - 8.1 g/dL   Albumin 3.8 3.5 - 5.0 g/dL   AST 213 (H) 15 - 41 U/L   ALT 301 (H) 0 - 44 U/L   Alkaline Phosphatase 144 (H) 38 - 126 U/L   Total Bilirubin 1.9 (H) 0.3 - 1.2 mg/dL   GFR calc non Af Amer >60 >60 mL/min   GFR calc Af Amer >60 >60 mL/min   Anion gap 14 5 - 15  CBC with Differential     Status: Abnormal  Collection Time: 03/03/19  7:46 PM  Result Value Ref Range   WBC 16.1 (H) 4.0 - 10.5 K/uL   RBC 4.74 3.87 - 5.11 MIL/uL   Hemoglobin 14.3 12.0 - 15.0 g/dL   HCT 20.7 21.8 - 28.8 %   MCV 86.9 80.0 - 100.0 fL   MCH 30.2 26.0 - 34.0 pg   MCHC 34.7 30.0 - 36.0 g/dL    RDW 33.7 44.5 - 14.6 %   Platelets 401 (H) 150 - 400 K/uL   nRBC 0.0 0.0 - 0.2 %   Neutrophils Relative % 69 %   Neutro Abs 11.0 (H) 1.7 - 7.7 K/uL   Lymphocytes Relative 23 %   Lymphs Abs 3.8 0.7 - 4.0 K/uL   Monocytes Relative 7 %   Monocytes Absolute 1.1 (H) 0.1 - 1.0 K/uL   Eosinophils Relative 0 %   Eosinophils Absolute 0.1 0.0 - 0.5 K/uL   Basophils Relative 0 %   Basophils Absolute 0.0 0.0 - 0.1 K/uL   Immature Granulocytes 1 %   Abs Immature Granulocytes 0.11 (H) 0.00 - 0.07 K/uL  Lipase, blood     Status: None   Collection Time: 03/03/19  7:46 PM  Result Value Ref Range   Lipase 41 11 - 51 U/L    Assessment/Plan: 28 year old G1P0 at 26.1 weeks CHTN with SI PreEclampsia No PNC  -Dr. Debroah Loop consulted and agrees with admission to antepartum. -Admit to antepartum -Antepartum orders placed with modifications/additions as below: *Complete OB Panel to be collected *Repeat CBC and CMP in AM *Obtain PC Ratio and Drug screen, Start 24 hr urine *KDur daily *PreEclampsia protocol orders *Protonix 40mg  daily *Reglan 10mg  prn *Okay for regular diet *Complete US with MFM in AM -POC discussed with patient who denies questions or concerns -Dr. Debroah Loop at bedside to assess  Cherre Robins MSN, CNM 03/04/2019, 1:40 AM   Addendum 2:24 AM -Nurse reports patient refused initial BMZ injection.    Cherre Robins MSN, CNM

## 2019-03-04 NOTE — ED Notes (Signed)
[facility] has arrived for patient transport to MAU.

## 2019-03-04 NOTE — Progress Notes (Signed)
Patient ID: Jean Mata, female   DOB: [birth year], 28 y.o.   MRN: [id number]  FACULTY PRACTICE ANTEPARTUM NOTE  Naveena Contino is a 28 y.o. G1P0 at [redacted]w[redacted]d  who is admitted for elevated blood pressures and elevated LFTs.   Fetal presentation is cephalic. Length of Stay:  0  Days  Subjective: No headache, blurred vision. Appetite okay - ate something. Has excessive saliva and spitting. Nausea somewhat better. Patient reports good fetal movement.   She reports no uterine contractions She reports no bleeding  She reports no loss of fluid per vagina.  Vitals:  Blood pressure (!) 143/99, pulse (!) 109, temperature 97.7 F (36.5 C), temperature source Oral, resp. rate 20, height  (1.88 m), weight 111.6 kg, last menstrual period 10/11/2018, SpO2 100 %. Physical Examination:  General appearance - alert, well appearing, and in no distress Abd: soft, non-tender throughout, including right upper quadrant.  Fundal Height:  size equals dates Extremities: extremities normal, atraumatic, no cyanosis or edema  Membranes:intact  Fetal Monitoring:  Baseline: 150 bpm, Variability: Good {> 6 bpm) and Accelerations: Non-reactive but appropriate for gestational age  Labs:  Results for orders placed or performed during the hospital encounter of 03/03/19 (from the past 24 hour(s))  Comprehensive metabolic panel   Collection Time: 03/03/19  7:46 PM  Result Value Ref Range   Sodium 131 (L) 135 - 145 mmol/L   Potassium 2.3 (LL) 3.5 - 5.1 mmol/L   Chloride 96 (L) 98 - 111 mmol/L   CO2 21 (L) 22 - 32 mmol/L   Glucose, Bld 105 (H) 70 - 99 mg/dL   BUN 13 6 - 20 mg/dL   Creatinine, Ser 1.19 0.44 - 1.00 mg/dL   Calcium 9.4 8.9 - 14.7 mg/dL   Total Protein 8.4 (H) 6.5 - 8.1 g/dL   Albumin 3.8 3.5 - 5.0 g/dL   AST 829 (H) 15 - 41 U/L   ALT 301 (H) 0 - 44 U/L   Alkaline Phosphatase 144 (H) 38 - 126 U/L   Total Bilirubin 1.9 (H) 0.3 - 1.2 mg/dL   GFR calc non Af Amer >60 >60 mL/min   GFR calc Af Amer >60  >60 mL/min   Anion gap 14 5 - 15  CBC with Differential   Collection Time: 03/03/19  7:46 PM  Result Value Ref Range   WBC 16.1 (H) 4.0 - 10.5 K/uL   RBC 4.74 3.87 - 5.11 MIL/uL   Hemoglobin 14.3 12.0 - 15.0 g/dL   HCT 56.2 13.0 - 86.5 %   MCV 86.9 80.0 - 100.0 fL   MCH 30.2 26.0 - 34.0 pg   MCHC 34.7 30.0 - 36.0 g/dL   RDW 78.4 69.6 - 29.5 %   Platelets 401 (H) 150 - 400 K/uL   nRBC 0.0 0.0 - 0.2 %   Neutrophils Relative % 69 %   Neutro Abs 11.0 (H) 1.7 - 7.7 K/uL   Lymphocytes Relative 23 %   Lymphs Abs 3.8 0.7 - 4.0 K/uL   Monocytes Relative 7 %   Monocytes Absolute 1.1 (H) 0.1 - 1.0 K/uL   Eosinophils Relative 0 %   Eosinophils Absolute 0.1 0.0 - 0.5 K/uL   Basophils Relative 0 %   Basophils Absolute 0.0 0.0 - 0.1 K/uL   Immature Granulocytes 1 %   Abs Immature Granulocytes 0.11 (H) 0.00 - 0.07 K/uL  Lipase, blood   Collection Time: 03/03/19  7:46 PM  Result Value Ref Range   Lipase 41 11 -  51 U/L  Hemoglobin A1c   Collection Time: 03/04/19  2:20 AM  Result Value Ref Range   Hgb A1c MFr Bld 4.9 4.8 - 5.6 %   Mean Plasma Glucose 93.93 mg/dL  Type and screen [facility]   Collection Time: 03/04/19  2:20 AM  Result Value Ref Range   ABO/RH(D) O POS    Antibody Screen NEG    Sample Expiration      03/07/2019 Performed at [facility], [address] [place], [place] [address]   ABO/Rh   Collection Time: 03/04/19  2:20 AM  Result Value Ref Range   ABO/RH(D)      O POS Performed at [facility], [address], [place], [place] [address]   Urine rapid drug screen (hosp performed)not at [facility]   Collection Time: 03/04/19  2:52 AM  Result Value Ref Range   Opiates NONE DETECTED NONE DETECTED   Cocaine NONE DETECTED NONE DETECTED   Benzodiazepines NONE DETECTED NONE DETECTED   Amphetamines NONE DETECTED NONE DETECTED   Tetrahydrocannabinol POSITIVE (A) NONE DETECTED   Barbiturates NONE DETECTED NONE DETECTED  Protein / creatinine  ratio, urine   Collection Time: 03/04/19  2:52 AM  Result Value Ref Range   Creatinine, Urine 221.80 mg/dL   Total Protein, Urine 50 mg/dL   Protein Creatinine Ratio 0.23 (H) 0.00 - 0.15 mg/mg[Cre]  CBC   Collection Time: 03/04/19  5:48 AM  Result Value Ref Range   WBC 15.1 (H) 4.0 - 10.5 K/uL   RBC 3.98 3.87 - 5.11 MIL/uL   Hemoglobin 12.2 12.0 - 15.0 g/dL   HCT 10.0 (L) 71.2 - 19.7 %   MCV 85.9 80.0 - 100.0 fL   MCH 30.7 26.0 - 34.0 pg   MCHC 35.7 30.0 - 36.0 g/dL   RDW 58.8 32.5 - 49.8 %   Platelets 344 150 - 400 K/uL   nRBC 0.0 0.0 - 0.2 %  Comprehensive metabolic panel   Collection Time: 03/04/19  5:48 AM  Result Value Ref Range   Sodium 133 (L) 135 - 145 mmol/L   Potassium 2.4 (LL) 3.5 - 5.1 mmol/L   Chloride 100 98 - 111 mmol/L   CO2 24 22 - 32 mmol/L   Glucose, Bld 122 (H) 70 - 99 mg/dL   BUN 8 6 - 20 mg/dL   Creatinine, Ser 2.64 0.44 - 1.00 mg/dL   Calcium 8.9 8.9 - 15.8 mg/dL   Total Protein 6.8 6.5 - 8.1 g/dL   Albumin 3.0 (L) 3.5 - 5.0 g/dL   AST 309 (H) 15 - 41 U/L   ALT 299 (H) 0 - 44 U/L   Alkaline Phosphatase 119 38 - 126 U/L   Total Bilirubin 1.9 (H) 0.3 - 1.2 mg/dL   GFR calc non Af Amer >60 >60 mL/min   GFR calc Af Amer >60 >60 mL/min   Anion gap 9 5 - 15  Amylase   Collection Time: 03/04/19  9:55 AM  Result Value Ref Range   Amylase 85 28 - 100 U/L  Lipase, blood   Collection Time: 03/04/19  9:55 AM  Result Value Ref Range   Lipase 40 11 - 51 U/L    Imaging Studies:    RUQ US shows 11mm stone with sludge. No GB wall thickening    Medications:  Scheduled . betamethasone acetate-betamethasone sodium phosphate  12 mg Intramuscular Q24H  . docusate sodium  100 mg Oral Daily  . pantoprazole  40 mg Oral Daily  . prenatal multivitamin  1 tablet Oral Q1200  . sodium chloride flush  3 mL Intravenous Q12H   I have reviewed the patient's current medications. Prior to Admission:  Medications Prior to Admission  Medication Sig Dispense Refill Last  Dose  . bismuth subsalicylate (PEPTO BISMOL) 262 MG/15ML suspension Take 30 mLs by mouth every 6 (six) hours as needed.   03/03/2019 at Unknown time  . cephALEXin (KEFLEX) 500 MG capsule Take 1 capsule (500 mg total) by mouth 2 (two) times daily. 10 capsule 0 Past Week at Unknown time  . metoCLOPramide (REGLAN) 10 MG tablet Take 1 tablet (10 mg total) by mouth every 6 (six) hours as needed for nausea or vomiting. 12 tablet 0 Past Week at Unknown time   Scheduled: . betamethasone acetate-betamethasone sodium phosphate  12 mg Intramuscular Q24H  . docusate sodium  100 mg Oral Daily  . pantoprazole  40 mg Oral Daily  . sodium chloride flush  3 mL Intravenous Q12H   Continuous: . sodium chloride 250 mL (03/04/19 0950)  . potassium chloride 10 mEq (03/04/19 1218)   HOZ:YYQMGN chloride, calcium carbonate, glycopyrrolate, labetalol **AND** labetalol **AND** labetalol **AND** hydrALAZINE **AND** Measure blood pressure, metoCLOPramide, sodium chloride flush, zolpidem  ASSESSMENT: Active Problems:   Chronic hypertension with superimposed preeclampsia   [redacted] weeks gestation of pregnancy   No prenatal care in current pregnancy   Elevated LFTs   PLAN: 1.  CHTN - ? Superimposed preeclampsia 1. Continue labetalol prn severe range 2. Elevated LFTs 1. Stable 2. Discussed with MFM - unclear as to whether this is HELLP 3. Agreed with BMZ, no mag for now. 4. Will repeat LFTs in AM 5. Acute hepatitis panel pending 6. Hold potential hepatic stressing meds (PNV, tylenol) 7. Unlikely cholecystitis. If starts to have increasing abdominal pain, will discuss with Gen Surg. 3. Hyperemesis 1. Rubinol prn 2. phenergan 4. NPC 1. PNL pending 2. Will need 2hr GTT a week after BMZ 5. [redacted] weeks gestation 6. Hypokalemia 1. Replace potassium and recheck tomorrow.   Continue routine antenatal care.   Levie Heritage, DO 03/04/2019,12:19 PM

## 2019-03-05 DIAGNOSIS — O112 Pre-existing hypertension with pre-eclampsia, second trimester: Principal | ICD-10-CM

## 2019-03-05 DIAGNOSIS — O113 Pre-existing hypertension with pre-eclampsia, third trimester: Secondary | ICD-10-CM

## 2019-03-05 LAB — COMPREHENSIVE METABOLIC PANEL
ALT: 349 U/L — ABNORMAL HIGH (ref 0–44)
AST: 216 U/L — ABNORMAL HIGH (ref 15–41)
Albumin: 3.2 g/dL — ABNORMAL LOW (ref 3.5–5.0)
Alkaline Phosphatase: 114 U/L (ref 38–126)
Anion gap: 10 (ref 5–15)
BILIRUBIN TOTAL: 2 mg/dL — AB (ref 0.3–1.2)
BUN: 5 mg/dL — ABNORMAL LOW (ref 6–20)
CO2: 18 mmol/L — ABNORMAL LOW (ref 22–32)
Calcium: 8.7 mg/dL — ABNORMAL LOW (ref 8.9–10.3)
Chloride: 103 mmol/L (ref 98–111)
Creatinine, Ser: 0.63 mg/dL (ref 0.44–1.00)
GFR calc Af Amer: >60 mL/min (ref >60)
GFR calc non Af Amer: >60 mL/min (ref >60)
Glucose, Bld: 117 mg/dL — ABNORMAL HIGH (ref 70–99)
Potassium: 2.8 mmol/L — ABNORMAL LOW (ref 3.5–5.1)
Sodium: 131 mmol/L — ABNORMAL LOW (ref 135–145)
TOTAL PROTEIN: 7.1 g/dL (ref 6.5–8.1)

## 2019-03-05 LAB — HEPATITIS PANEL, ACUTE
HCV Ab: <0.1 s/co ratio (ref 0.0–0.9)
Hep A IgM: NEGATIVE
Hep B C IgM: UNDETERMINED
Hepatitis B Surface Ag: NEGATIVE

## 2019-03-05 LAB — CBC
HCT: 33.3 % — ABNORMAL LOW (ref 36.0–46.0)
Hemoglobin: 12 g/dL (ref 12.0–15.0)
MCH: 30.4 pg (ref 26.0–34.0)
MCHC: 36 g/dL (ref 30.0–36.0)
MCV: 84.3 fL (ref 80.0–100.0)
Platelets: 319 10*3/uL (ref 150–400)
RBC: 3.95 MIL/uL (ref 3.87–5.11)
RDW: 12.6 % (ref 11.5–15.5)
WBC: 23.2 10*3/uL — ABNORMAL HIGH (ref 4.0–10.5)
nRBC: 0 % (ref 0.0–0.2)

## 2019-03-05 LAB — LACTATE DEHYDROGENASE: LDH: 186 U/L (ref 98–192)

## 2019-03-05 LAB — PROTEIN, URINE, 24 HOUR
Collection Interval-UPROT: 24 hours
Protein, 24H Urine: 470 mg/d — ABNORMAL HIGH (ref 50–100)
Protein, Urine: 47 mg/dL
URINE TOTAL VOLUME-UPROT: 1000 mL

## 2019-03-05 LAB — MONONUCLEOSIS SCREEN: Mono Screen: NEGATIVE

## 2019-03-05 LAB — RUBELLA SCREEN: Rubella: 1.12 index (ref >0.99)

## 2019-03-05 MED ORDER — POTASSIUM CHLORIDE CRYS ER 20 MEQ PO TBCR
40.0000 meq | EXTENDED_RELEASE_TABLET | Freq: Three times a day (TID) | ORAL | Status: DC
  Administered 2019-03-05 – 2019-03-06 (×5): 40 meq via ORAL
  Filled 2019-03-05 (×5): qty 2

## 2019-03-05 NOTE — Progress Notes (Signed)
FACULTY PRACTICE ANTEPARTUM PROGRESS NOTE  Jean Mata is a 28 y.o. G1P0 at [redacted]w[redacted]d who is admitted for elevated liver enzymes, chronic hypertension.  Estimated Date of Delivery: 06/09/19 Fetal presentation is unsure.  Length of Stay:  1 Days. Admitted 03/03/2019  Subjective:  Patient reports normal fetal movement.  She denies uterine contractions, denies bleeding and leaking of fluid per vagina. Reports still some nausea, associated with reflux. Does have appetite. Does have some pain in the epigastric area, no other place.   Vitals:  Blood pressure (!) 138/91, pulse (!) 122, temperature 98.3 F (36.8 C), temperature source Oral, resp. rate 18, height 6\' 2"  (1.88 m), weight 111.6 kg, last menstrual period 10/11/2018, SpO2 100 %. Physical Examination: CONSTITUTIONAL: Well-developed, well-nourished female in no acute distress.  HENT:  Normocephalic, atraumatic, External right and left ear normal. Oropharynx is clear and moist EYES: Conjunctivae and EOM are normal. Pupils are equal, round, and reactive to light. No scleral icterus.  NECK: Normal range of motion, supple, no masses. SKIN: Skin is warm and dry. No rash noted. Not diaphoretic. No erythema. No pallor. NEUROLGIC: Alert and oriented to person, place, and time. Normal reflexes, muscle tone coordination. No cranial nerve deficit noted. PSYCHIATRIC: Normal mood and affect. Normal behavior. Normal judgment and thought content. CARDIOVASCULAR: Normal heart rate noted, regular rhythm RESPIRATORY: Effort and breath sounds normal, no problems with respiration noted MUSCULOSKELETAL: Normal range of motion. No edema and no tenderness. ABDOMEN: Soft, nontender, nondistended, gravid. CERVIX: deferred  Fetal monitoring: FHR: 145 bpm, Variability: moderate, Accelerations: Present, Decelerations: Absent  Uterine activity: no contractions per hour  Results for orders placed or performed during the hospital encounter of 03/03/19 (from the past 48  hour(s))  Comprehensive metabolic panel     Status: Abnormal   Collection Time: 03/03/19  7:46 PM  Result Value Ref Range   Sodium 131 (L) 135 - 145 mmol/L   Potassium 2.3 (LL) 3.5 - 5.1 mmol/L    Comment: CRITICAL RESULT CALLED TO, READ BACK BY AND VERIFIED WITH:  NEAL KELLIE RN AT 2036 03/03/19 BY HASSANR    Chloride 96 (L) 98 - 111 mmol/L   CO2 21 (L) 22 - 32 mmol/L   Glucose, Bld 105 (H) 70 - 99 mg/dL   BUN 13 6 - 20 mg/dL   Creatinine, Ser 1.61 0.44 - 1.00 mg/dL   Calcium 9.4 8.9 - 09.6 mg/dL   Total Protein 8.4 (H) 6.5 - 8.1 g/dL   Albumin 3.8 3.5 - 5.0 g/dL   AST 045 (H) 15 - 41 U/L   ALT 301 (H) 0 - 44 U/L   Alkaline Phosphatase 144 (H) 38 - 126 U/L   Total Bilirubin 1.9 (H) 0.3 - 1.2 mg/dL   GFR calc non Af Amer >60 >60 mL/min   GFR calc Af Amer >60 >60 mL/min   Anion gap 14 5 - 15    Comment: Performed at [facility], [address] [place], [place] [address]  CBC with Differential     Status: Abnormal   Collection Time: 03/03/19  7:46 PM  Result Value Ref Range   WBC 16.1 (H) 4.0 - 10.5 K/uL   RBC 4.74 3.87 - 5.11 MIL/uL   Hemoglobin 14.3 12.0 - 15.0 g/dL   HCT 19.1 47.8 - 29.5 %   MCV 86.9 80.0 - 100.0 fL   MCH 30.2 26.0 - 34.0 pg   MCHC 34.7 30.0 - 36.0 g/dL   RDW 62.1 30.8 - 65.7 %  Platelets 401 (H) 150 - 400 K/uL   nRBC 0.0 0.0 - 0.2 %   Neutrophils Relative % 69 %   Neutro Abs 11.0 (H) 1.7 - 7.7 K/uL   Lymphocytes Relative 23 %   Lymphs Abs 3.8 0.7 - 4.0 K/uL   Monocytes Relative 7 %   Monocytes Absolute 1.1 (H) 0.1 - 1.0 K/uL   Eosinophils Relative 0 %   Eosinophils Absolute 0.1 0.0 - 0.5 K/uL   Basophils Relative 0 %   Basophils Absolute 0.0 0.0 - 0.1 K/uL   Immature Granulocytes 1 %   Abs Immature Granulocytes 0.11 (H) 0.00 - 0.07 K/uL    Comment: Performed at [facility], [address] [place], [place] [address]  Lipase, blood     Status: None   Collection Time: 03/03/19  7:46 PM  Result Value Ref Range   Lipase 41  11 - 51 U/L    Comment: Performed at [facility], [address] [place], [place] [address]  Hepatitis B surface antigen     Status: None   Collection Time: 03/04/19  2:20 AM  Result Value Ref Range   Hepatitis B Surface Ag Negative Negative    Comment: (NOTE) A courtesy copy of this report has been sent to [facility], [phone] Performed At: [facility] [address] [place], [place] [id number] Jolene Schimke MD HY:[id number]   Rubella screen     Status: None   Collection Time: 03/04/19  2:20 AM  Result Value Ref Range   Rubella 1.12 Immune >0.99 index    Comment: (NOTE)                                Non-immune       <0.90                                Equivocal  0.90 - 0.99                                Immune           >0.99 Performed At: [facility] [address] [place], [place] [id number] Jolene Schimke MD MW:[id number]   RPR     Status: None   Collection Time: 03/04/19  2:20 AM  Result Value Ref Range   RPR Ser Ql Non Reactive Non Reactive    Comment: (NOTE) Performed At: [facility] [address] [place], [place] [id number] Jolene Schimke MD DG:[id number]   HIV antibody (routine testing)     Status: None   Collection Time: 03/04/19  2:20 AM  Result Value Ref Range   HIV Screen 4th Generation wRfx Non Reactive Non Reactive    Comment: (NOTE) Performed At: [facility] [address] [place], [place] [id number] Jolene Schimke MD YT:[id number]   Type and screen [facility]     Status: None   Collection Time: 03/04/19  2:20 AM  Result Value Ref Range   ABO/RH(D) O POS    Antibody Screen NEG    Sample Expiration      03/07/2019 Performed at [facility], [address], [place], [place] [address]   Hemoglobin A1c     Status: None   Collection Time: 03/04/19  2:20 AM  Result Value Ref Range   Hgb A1c MFr Bld 4.9 4.8 - 5.6 %    Comment: (NOTE) Pre diabetes:           5.7%-6.4% Diabetes:              >6.4% Glycemic control for   <7.0% adults with diabetes    Mean Plasma Glucose 93.93 mg/dL    Comment: Performed at [facility], [address], [place], [place] [address]  ABO/Rh     Status: None   Collection Time: 03/04/19  2:20 AM  Result Value Ref Range   ABO/RH(D) O POS    No rh immune globuloin      NOT A RH IMMUNE GLOBULIN CANDIDATE, PT RH POSITIVE Performed at [facility], [address], [place], [place] [address]   Urine rapid drug screen (hosp performed)not at [facility]     Status: Abnormal   Collection Time: 03/04/19  2:52 AM  Result Value Ref Range   Opiates NONE DETECTED NONE DETECTED   Cocaine NONE DETECTED NONE DETECTED   Benzodiazepines NONE DETECTED NONE DETECTED   Amphetamines NONE DETECTED NONE DETECTED   Tetrahydrocannabinol POSITIVE (A) NONE DETECTED   Barbiturates NONE DETECTED NONE DETECTED    Comment: (NOTE) DRUG SCREEN FOR MEDICAL PURPOSES ONLY.  IF CONFIRMATION IS NEEDED FOR ANY PURPOSE, NOTIFY LAB WITHIN 5 DAYS. LOWEST DETECTABLE LIMITS FOR URINE DRUG SCREEN Drug Class                     Cutoff (ng/mL) Amphetamine and metabolites    1000 Barbiturate and metabolites    200 Benzodiazepine                 200 Tricyclics and metabolites     300 Opiates and metabolites        300 Cocaine and metabolites        300 THC                            50 Performed at [facility], [address] [place], [place] [address]   Protein / creatinine ratio, urine     Status: Abnormal   Collection Time: 03/04/19  2:52 AM  Result Value Ref Range   Creatinine, Urine 221.80 mg/dL   Total Protein, Urine 50 mg/dL    Comment: NO NORMAL RANGE ESTABLISHED FOR THIS TEST   Protein Creatinine Ratio 0.23 (H) 0.00 - 0.15 mg/mg[Cre]    Comment: Performed at [facility], [address] [place], [place] [address]  Protein, urine, 24 hour     Status: Abnormal   Collection Time: 03/04/19  4:00 AM  Result Value Ref  Range   Urine Total Volume-UPROT 1,000 mL   Collection Interval-UPROT 24 hours   Protein, Urine 47 mg/dL   Protein, 29F Urine 621 (H) 50 - 100 mg/day    Comment: Performed at [facility], [address], [place], [place] [address]  CBC     Status: Abnormal   Collection Time: 03/04/19  5:48 AM  Result Value Ref Range   WBC 15.1 (H) 4.0 - 10.5 K/uL   RBC 3.98 3.87 - 5.11 MIL/uL   Hemoglobin 12.2 12.0 - 15.0 g/dL   HCT 78.4 (L) 69.6 - 29.5 %   MCV 85.9 80.0 - 100.0 fL   MCH 30.7 26.0 - 34.0 pg   MCHC 35.7 30.0 - 36.0  g/dL   RDW 40.9 81.1 - 91.4 %   Platelets 344 150 - 400 K/uL   nRBC 0.0 0.0 - 0.2 %    Comment: Performed at [facility], [address] [place], [place] [address]  Comprehensive metabolic panel     Status: Abnormal   Collection Time: 03/04/19  5:48 AM  Result Value Ref Range   Sodium 133 (L) 135 - 145 mmol/L   Potassium 2.4 (LL) 3.5 - 5.1 mmol/L    Comment: CRITICAL RESULT CALLED TO, READ BACK BY AND VERIFIED WITH: HOLLEY C,RN 03/04/19 0646 WAYK    Chloride 100 98 - 111 mmol/L   CO2 24 22 - 32 mmol/L   Glucose, Bld 122 (H) 70 - 99 mg/dL   BUN 8 6 - 20 mg/dL   Creatinine, Ser 6.21 0.44 - 1.00 mg/dL   Calcium 8.9 8.9 - 30.8 mg/dL   Total Protein 6.8 6.5 - 8.1 g/dL   Albumin 3.0 (L) 3.5 - 5.0 g/dL   AST 657 (H) 15 - 41 U/L   ALT 299 (H) 0 - 44 U/L   Alkaline Phosphatase 119 38 - 126 U/L   Total Bilirubin 1.9 (H) 0.3 - 1.2 mg/dL   GFR calc non Af Amer >60 >60 mL/min   GFR calc Af Amer >60 >60 mL/min   Anion gap 9 5 - 15    Comment: Performed at [facility], [address], [place], [place] [address]  Hepatitis panel, acute     Status: None   Collection Time: 03/04/19  9:55 AM  Result Value Ref Range   Hepatitis B Surface Ag Negative Negative   HCV Ab <0.1 0.0 - 0.9 s/co ratio    Comment: (NOTE)                                  Negative:     < 0.8                             Indeterminate: 0.8 - 0.9                                   Positive:     > 0.9 The CDC recommends that a positive HCV antibody result be followed up with a HCV Nucleic Acid Amplification test (295284). Performed At: [facility] [address] [place], [place] [id number] Jolene Schimke MD VO:[id number]    Hep A IgM Negative Negative   Hep B C IgM Indeterminate Negative    Comment: **Verified by repeat analysis**  Amylase     Status: None   Collection Time: 03/04/19  9:55 AM  Result Value Ref Range   Amylase 85 28 - 100 U/L    Comment: Performed at [facility], [address], [place], [place] [address]  Lipase, blood     Status: None   Collection Time: 03/04/19  9:55 AM  Result Value Ref Range   Lipase 40 11 - 51 U/L    Comment: Performed at [facility], [address], [place], [place] [address]  Comprehensive metabolic panel     Status: Abnormal   Collection Time: 03/05/19  3:40 AM  Result Value Ref Range   Sodium 131 (L) 135 - 145 mmol/L   Potassium 2.8 (  L) 3.5 - 5.1 mmol/L   Chloride 103 98 - 111 mmol/L   CO2 18 (L) 22 - 32 mmol/L   Glucose, Bld 117 (H) 70 - 99 mg/dL   BUN 5 (L) 6 - 20 mg/dL   Creatinine, Ser 4.78 0.44 - 1.00 mg/dL   Calcium 8.7 (L) 8.9 - 10.3 mg/dL   Total Protein 7.1 6.5 - 8.1 g/dL   Albumin 3.2 (L) 3.5 - 5.0 g/dL   AST 295 (H) 15 - 41 U/L   ALT 349 (H) 0 - 44 U/L   Alkaline Phosphatase 114 38 - 126 U/L   Total Bilirubin 2.0 (H) 0.3 - 1.2 mg/dL   GFR calc non Af Amer >60 >60 mL/min   GFR calc Af Amer >60 >60 mL/min   Anion gap 10 5 - 15    Comment: Performed at [facility], [address], [place], [place] [address]  CBC     Status: Abnormal   Collection Time: 03/05/19  3:40 AM  Result Value Ref Range   WBC 23.2 (H) 4.0 - 10.5 K/uL   RBC 3.95 3.87 - 5.11 MIL/uL   Hemoglobin 12.0 12.0 - 15.0 g/dL   HCT 86.5 (L) 78.4 - 69.6 %   MCV 84.3 80.0 - 100.0 fL   MCH 30.4 26.0 - 34.0 pg   MCHC 36.0 30.0 - 36.0 g/dL   RDW 29.5 28.4 - 13.2 %   Platelets 319 150 - 400 K/uL   nRBC 0.0  0.0 - 0.2 %    Comment: Performed at [facility], [address], [place], [place] [address]    [place] Mfm Ob Detail + 14 Weeks  Result Date: 03/04/2019 ----------------------------------------------------------------------  OBSTETRICS REPORT                       (Signed Final 03/04/2019 09:56 am) ---------------------------------------------------------------------- Patient Info  ID #:       [id number]                          D.O.B.:  09-16-1991 (27 yrs)  Name:       Jean Mata                   Visit Date: 03/04/2019 08:05 am ---------------------------------------------------------------------- Performed By  Performed By:     Hurman Horn          Ref. Address:     [address]                                                             [address]                                                             [place], [place]  [address]  Attending:        Noralee Space MD        [facility]  Referred By:      Gerrit Heck                    CNM ---------------------------------------------------------------------- Orders   #  Description                          Code         Ordered By   1  [place] MFM OB DETAIL +14 WK              768[address].01     JESSICA EMLY  ----------------------------------------------------------------------   #  Order #                    Accession #                 Episode #   1  [id number]                  [id number]                  [id number]  ---------------------------------------------------------------------- Indications   Encounter for antenatal screening for          Z36.3   malformations   Insufficient Prenatal Care (no prenatal care)  O09.30   Hypertension - Chronic/Pre-existing            O10.019   (labetalol)   [redacted] weeks gestation of pregnancy                Z3A.26   ---------------------------------------------------------------------- Fetal Evaluation  Num Of Fetuses:         1  Fetal Heart Rate(bpm):  150  Cardiac Activity:       Observed  Presentation:           Cephalic  Placenta:               Posterior  P. Cord Insertion:      Visualized  Amniotic Fluid  AFI FV:      Within normal limits                              Largest Pocket(cm)                              4.15 ---------------------------------------------------------------------- Biometry  BPD:      58.2  mm     G. Age:  23w 6d        < 1  %    CI:        80.42   %    70 - 86  FL/HC:      21.8   %    18.6 - 20.4  HC:       205   mm     G. Age:  22w 4d        < 3  %    HC/AC:      1.03        1.04 - 1.22  AC:      198.8  mm     G. Age:  24w 4d          7  %    FL/BPD:     76.6   %    71 - 87  FL:       44.6  mm     G. Age:  24w 5d          7  %    FL/AC:      22.4   %    20 - 24  HUM:      39.8  mm     G. Age:  24w 2d        < 5  %  CER:      27.4  mm     G. Age:  24w 5d         23  %  Est. FW:     689  gm      1 lb 8 oz     20  % ---------------------------------------------------------------------- OB History  Gravidity:    1 ---------------------------------------------------------------------- Gestational Age  LMP:           20w 4d        Date:  10/11/18                 EDD:   07/18/19  U/S Today:     24w 0d                                        EDD:   06/24/19  Best:          26w 1d     Det. By:  Previous Ultrasound      EDD:   06/09/19                                      (10/30/18) ---------------------------------------------------------------------- Anatomy  Cranium:               Appears normal         Aortic Arch:            Appears normal  Cavum:                 Appears normal         Ductal Arch:            Not well visualized  Ventricles:            Appears normal         Diaphragm:              Appears normal  Choroid Plexus:        Appears  normal         Stomach:                Appears normal, left  sided  Cerebellum:            Appears normal         Abdomen:                Appears normal  Posterior Fossa:       Not well visualized    Abdominal Wall:         Appears nml (cord                                                                        insert, abd wall)  Nuchal Fold:           Not well visualized    Cord Vessels:           Appears normal (3                                                                        vessel cord)  Face:                  Orbits appear          Kidneys:                Appear normal                         normal  Lips:                  Appears normal         Bladder:                Appears normal  Thoracic:              Appears normal         Spine:                  Appears normal  Heart:                 Not well visualized    Upper Extremities:      Appears normal  RVOT:                  Not well visualized    Lower Extremities:      Appears normal  LVOT:                  Not well visualized  Other:  Gender not well visualized. Technically difficult due to maternal          habitus and fetal position. ---------------------------------------------------------------------- Cervix Uterus Adnexa  Cervix  Length:           3.16  cm.  Normal appearance by transabdominal scan.  Uterus  No abnormality visualized.  Left Ovary  Within normal limits.  Right Ovary  Within normal limits.  Adnexa  No abnormality visualized. ---------------------------------------------------------------------- Impression  Patient was admitted with c/o nausea and vomiting. Liver  enzymes are increased, but  platelets are normal. Patient has  hypertension and is being evaluated for preeclampsia.  Fetal growth is appropriate for gestational age. Amniotic fluid  is normal and good fetal activity is seen. Fetal anatomy  appears normal. Cardiac anatomy and posterior fossa could  not  be evaluated. ---------------------------------------------------------------------- Recommendations  -Follow-up ultrasound (limited) next week if she is still an  inpatient. ----------------------------------------------------------------------                  Noralee Space, MD Electronically Signed Final Report   03/04/2019 09:56 am ----------------------------------------------------------------------  US Abdomen Limited Ruq  Result Date: 03/03/2019 CLINICAL DATA:  Epigastric pain for 1 week. Twenty-four weeks pregnant. EXAM: ULTRASOUND ABDOMEN LIMITED RIGHT UPPER QUADRANT COMPARISON:  None. FINDINGS: Gallbladder: Small amount of dependent sludge. Tiny, 3 mm dependent stone. No wall thickening. No pericholecystic fluid. Common bile duct: Diameter: 4 mm Liver: No focal lesion identified. Within normal limits in parenchymal echogenicity. Portal vein is patent on color Doppler imaging with normal direction of blood flow towards the liver. IMPRESSION: 1. No acute findings. 2. Tiny gallstone and small amount of dependent gallbladder sludge. No acute cholecystitis. No bile duct dilation. Electronically Signed   By: Amie Portland M.D.   On: 03/03/2019 22:34    Current scheduled medications . betamethasone acetate-betamethasone sodium phosphate  12 mg Intramuscular Q24H  . docusate sodium  100 mg Oral Daily  . pantoprazole  40 mg Oral Daily  . potassium chloride  40 mEq Oral TID  . sodium chloride flush  3 mL Intravenous Q12H    I have reviewed the patient's current medications.  ASSESSMENT: Principal Problem:   Chronic hypertension with superimposed preeclampsia Active Problems:   [redacted] weeks gestation of pregnancy   No prenatal care in current pregnancy   Elevated LFTs   Hyperemesis   Hypokalemia   PLAN: LFTs trending slightly up BP mildly elevated but stable no meds Improved nausea overnight, one episode of emesis this am MFM consult today S/p BTMZ x2 Have added bile acids/EBV/CMV/Toxo  screen Cont PO potassium supplementation   Continue routine antenatal care.   Baldemar Lenis, M.D. Attending [facility] [employer] (Faculty Practice)  03/05/2019 2:10 PM

## 2019-03-05 NOTE — Consult Note (Addendum)
Maternal-Fetal Medicine Name: Jean Mata MRN: [id number] Requesting Provider: Scheryl Darter, MD  Jean Mata P0 at 26w 2d gestation, was admitted yesterday because of abnormal liver enzyme results. She was seen at [facility] for c/o nausea, vomiting and diarrhea. Last week, patient sought care for c/o "acid reflux" and was also treated for UTI (Keflex, which she reportedly took for 5 days).  Patient had 1 episode of vomiting now and has no nausea.   She does not have severe headache or visual disturbances or right upper quadrant pain or vaginal bleeding. No history of chest pain or palpitations or joint or abdominal pain. No history of fever or rashes. She does not have pruritus.  Since admission, the patient received betamethasone. Liver enzymes are increased (see below), but platelets are within normal limits. Serum bilirubin is mildly elevated. Amylase and lipase are not increased. Patient is taking protonix. Right upper quadrant ultrasound showed a small gall stone (3 mm) with small amount of sludge. No biliary dilation is seen.  PMH: Hypertension since age 64. Patient reports her hypertension has always been mild and she never took antihypertensives.  No history of diabetes or any other chronic medical conditions. PSH: Brain surgery. Hydrocephalus in 2009 that was drained, but shunt was placed. Allergies: NKDA. Social: Patient reports she used to "drink heavily" 4 years ago and does not take alcohol now. She admits to smoking marijuana. She does not use tobacco. Her partner is an Tree surgeon and he is in good health. Family history: Mother has hypertension. No history of venous thromboembolism in the family. Patient has not had prenatal care.  P/E: Patient is comfortably sitting up; not in pain. BP (24 hours) 131-146/87-106 mm Hg. Pulse 101-122/min HEENT: Normal. No pedal edema. Abdomen: Soft gravid uterus; no tenderness. No right upper quadrant or flank  tenderness. NST is reassuring for this gestational age.  On ultrasound performed yesterday, the fetal growth was appropriate for gestational age. Fetal anatomy was normal.  Labs: Hb 12, Hct 33.3, PLT 319, WBC 23.9 (post-steroid), electrolytes normal except potassium 2.8 (replacement being given), creatinine 0.63, amylase 85, lipase 40. Total bilirubin 2.0. Hepatitis A, B and C screening negative, mono screen negative, HIV nonreactive, rubella immune, RPR nonreactive. Hb A1C 4.9%.  AST ALT  03/04/19 189 216  03/05/19 216 349   I counseled the patient on the following: Abnormal liver function tests: Patient has mild hypertension and platelets are within normal limits. She does not have severe symptoms of preeclampsia. Increased proteinuria could be from long-standing hypertension. I explained the differential diagnosis of HELLP syndrome that seems unlikely. However, she requires continued inpatient management for a few days to assess liver enzymes and platelets, and definitively rule out preeclampsia. She agreed to stay in the hospital.  Gall stone could have contributed to her symptoms and increased liver enzymes. Normal lipase and amylase levels are reassuring (rules out pancreatitis).   Normal glucose levels and slightly high bilirubin makes acute fatty liver unlikely.  Infectious screening is negative so far. It is possible that unknown viruses could be contributing to the increased liver enzymes and that usually resolves spontaneously.  Patient reports she was taking Tylenol before off and on, but did not take in the last week.  Chronic hypertension: Patient has mild hypertension and I agree that she does not require antihypertensives now. We will continue to monitor her blood pressure for development of preeclampsia. Low-dose aspirin may be beneficial even though maximum benefit is seen when started before  16 weeks.  I briefly discussed management of chronic hypertension and development  of superimposed preeclampsia. If LFTs show increasing trend, I recommend hepatologist/GI consultation.  Prenatal care: She has not had prenatal care. I discussed cell-free fetal DNA screening and its limitations. Patient wants screening for fetal aneuploidies.  Recommendations: -CMV, toxo, Ebstein-Barr screening. -PT/PTT -Bile acids. -NIPT (patient to be counseled on insurance). -Hemoglobin electrophoresis to rule out sickle-cell trait. -Baseline EKG -Limited ultrasound next week if she is still an inpatient. -Hepatologist/GI consultation if LFTs are increasing.  Thank you for consultation.  Consultation including face-to-face counseling: 45 minutes.

## 2019-03-05 NOTE — Clinical SW OB High Risk (Signed)
Clinical Social Work Antenatal   Clinical Social Worker:  Dimple Nanas, LCSW Date/Time:  03/05/2019, 12:02 PM Gestational Age on Admission:  28 y.o. Admitting Diagnosis:  Significant elevation in LFTs and increased hypertension.    Expected Delivery Date:  06/08/19  Family/Home Environment  Home Address: [address] [place] [address]  Household Member/Support Name:  Penelope Galas 05/07/1992  Relationship:   FOB Other Support:  MOB reports no other support aside from a friend in [place], [place].  MOB reports that MOB's family is unaware of MOB's pregnancy is MOB communicated that she is afraid to tell her family due to fear that will not be supportive (financially or emotionally).   Psychosocial Data  Information Source:  Patient Interview Resources:  MOB was recently approved for Medicaid and receives food stamps.   Employment:  Per MOB, MOB had to recently quit her job due to daily morning and evening sickness.    Medicaid [facility]):  [employer]  School:  N/A   Current Grade: N/A  Homebound Arranged: No  Other Resources:  Medicaid, Magazine features editor Impacting Care: MOB family is not supportive.   Strengths/Weaknesses/Factors to Consider  Concerns Related to Hospitalization:  MOB expressed concerns regarding not being prepared to parent and having health issues that is currently impacting her pregnancy. MOB is anticipating having a premature baby.   Previous Pregnancies/Feelings Towards Pregnancy?  Concerns related to being/becoming a mother?:  MOB openly shared her desire to place the baby for adoption.   Social Support (FOB? Who is/will be helping with baby/other kids?): FOB and MOB's friend are the only people that are aware of MOB's pregnancy.   Couples Relationship (describe): MOB reports being in a healthy relationship with FOB and denied DV.    Recent Stressful Life Events (life changes in past year?): None  reported   Prenatal Care/Education/Home Preparations: None reported   Domestic Violence (of any type):  No If Yes to Domestic Violence, Describe/Action Plan:    Substance Use During Pregnancy: Yes(marijuana use hx.) (If Yes, Complete SBIRT)  Complete PHQ-9 (Depression Screening) on all Antenatal Patients PHQ-9 Score (If Score => 15 complete TREAT):     Follow-up Recommendations:  CSW provided MOB with a list of adoption agencies and encouraged MOB to make contact in order to establish an adoption plan.   Patient Advised/Response:   MOB was receptive and agreed to make contact with resources provided by CSW.   Other:   N/A   Clinical Assessment/Plan:  CSW met with MOB in room 108 to complete an assessment for limited prenatal care.  When CSW arrived, MOB and FOB were watching TV.  With MOB's permission, CSW asked FOB to leave the room in order to meet with MOB in private; FOB left without incidents.  MOB was polite, easy to engage, and receptive to meeting with CSW.   CSW inquired about MOB's lack of prenatal care and MOB shared that MOB did not have insurance coverage until recently and was unable to establish routine prenatal care. MOB also communicated, "I did not plan to carry out my pregnancy.  It was my plan to have an abortion and at first and I did not have the money to pay for it. When I did get some money and found resources to help me, I was too far along in my pregnancy."  MOB shared that MOB and FOB had multiple conversations regarding adoption and at this time it is their plan to contact  local adoption agencies to place the baby up for adoption.  CSW provided MOB with a list of agencies and encouraged MOB to make contact.   CSW asked about MOB's MH hx and MOB reported a hx of depression and bipolar disorder. Per MOB, MOB was dx 18 months ago and is not currently taking any medications. MOB shared that MOB is an established patient with [facility] and MOB was prescribed  medications (names unknown) after her initial dx, however have not taken any medications in "about a year." When CSW asked how MOB was managing her symptoms, MOB openly shared her use of marijuana. MOB stated, "I last smoked weed about 2 days ago and it help with my depression, insomnia, and nausea."  CSW processed other coping interventions to help with MOB's symptoms as opposed to smoking marijuana. CSW reviewed hospital's infant's substance exposure policy and MOB was understanding. CSW assessed for safety and MOB denied SI, HI, and DV. PMAD education was provided and CSW will meet with MOB after delivery to provide additional education.   CSW identifies no further need for intervention and no barriers to discharge at this time. CSW will be consulted again after delivery to provide postpartum education and resources.   Laurey Arrow, MSW, LCSW Clinical Social Work [phone] \

## 2019-03-06 DIAGNOSIS — R945 Abnormal results of liver function studies: Secondary | ICD-10-CM

## 2019-03-06 DIAGNOSIS — R1110 Vomiting, unspecified: Secondary | ICD-10-CM

## 2019-03-06 DIAGNOSIS — O119 Pre-existing hypertension with pre-eclampsia, unspecified trimester: Secondary | ICD-10-CM

## 2019-03-06 DIAGNOSIS — K210 Gastro-esophageal reflux disease with esophagitis: Secondary | ICD-10-CM

## 2019-03-06 LAB — COMPREHENSIVE METABOLIC PANEL
ALT: 514 U/L — ABNORMAL HIGH (ref 0–44)
AST: 296 U/L — ABNORMAL HIGH (ref 15–41)
Albumin: 3.3 g/dL — ABNORMAL LOW (ref 3.5–5.0)
Alkaline Phosphatase: 108 U/L (ref 38–126)
Anion gap: 11 (ref 5–15)
CO2: 22 mmol/L (ref 22–32)
Calcium: 9.3 mg/dL (ref 8.9–10.3)
Chloride: 100 mmol/L (ref 98–111)
Creatinine, Ser: 0.74 mg/dL (ref 0.44–1.00)
GFR calc Af Amer: >60 mL/min (ref >60)
Glucose, Bld: 114 mg/dL — ABNORMAL HIGH (ref 70–99)
Potassium: 2.5 mmol/L — CL (ref 3.5–5.1)
Sodium: 133 mmol/L — ABNORMAL LOW (ref 135–145)
Total Bilirubin: 1.3 mg/dL — ABNORMAL HIGH (ref 0.3–1.2)
Total Protein: 7 g/dL (ref 6.5–8.1)

## 2019-03-06 LAB — TOXOPLASMA ANTIBODIES- IGG AND  IGM
Toxoplasma Antibody- IgM: <3 AU/mL (ref 0.0–7.9)
Toxoplasma IgG Ratio: <3 IU/mL (ref 0.0–7.1)

## 2019-03-06 LAB — PROTIME-INR
INR: 1 (ref 0.8–1.2)
Prothrombin Time: 13.2 seconds (ref 11.4–15.2)

## 2019-03-06 LAB — EBV AB TO VIRAL CAPSID AG PNL, IGG+IGM
EBV VCA IgG: >600 U/mL — ABNORMAL HIGH (ref 0.0–17.9)
EBV VCA IgM: <36 U/mL (ref 0.0–35.9)

## 2019-03-06 LAB — MAGNESIUM: MAGNESIUM: 1.9 mg/dL (ref 1.7–2.4)

## 2019-03-06 LAB — CMV ANTIBODY, IGG (EIA): CMV Ab - IgG: 9.3 U/mL — ABNORMAL HIGH (ref 0.00–0.59)

## 2019-03-06 LAB — CMV IGM

## 2019-03-06 LAB — BILE ACIDS, TOTAL: Bile Acids Total: 12.2 umol/L — ABNORMAL HIGH (ref 0.0–10.0)

## 2019-03-06 MED ORDER — PANTOPRAZOLE SODIUM 40 MG IV SOLR
40.0000 mg | Freq: Two times a day (BID) | INTRAVENOUS | Status: DC
  Administered 2019-03-06 – 2019-03-08 (×4): 40 mg via INTRAVENOUS
  Filled 2019-03-06 (×6): qty 40

## 2019-03-06 MED ORDER — POTASSIUM CHLORIDE 10 MEQ/100ML IV SOLN
10.0000 meq | INTRAVENOUS | Status: AC
  Administered 2019-03-06 (×5): 10 meq via INTRAVENOUS
  Filled 2019-03-06 (×5): qty 100

## 2019-03-06 MED ORDER — METOCLOPRAMIDE HCL 10 MG PO TABS
10.0000 mg | ORAL_TABLET | Freq: Three times a day (TID) | ORAL | Status: DC
  Administered 2019-03-06 – 2019-03-09 (×12): 10 mg via ORAL
  Filled 2019-03-06 (×12): qty 1

## 2019-03-06 NOTE — Progress Notes (Signed)
FACULTY PRACTICE ANTEPARTUM PROGRESS NOTE  Jean Mata is a 28 y.o. G1P0 at [redacted]w[redacted]d who is admitted for elevated LFTs.  Estimated Date of Delivery: 06/09/19 Fetal presentation is unsure.  Length of Stay:  2 Days. Admitted 03/03/2019  Subjective: Patient vomiting on arrival, reports any time she eats or drinks anything, she feels it sit at the base of her chest and feels "pressure" and then it comes right back up. Happened a few times in early pregnancy but really worsened in last few weeks. She denies ever really feeling nauseous. Patient reports normal fetal movement.  She denies uterine contractions, denies bleeding and leaking of fluid per vagina.  Vitals:  Blood pressure (!) 142/85, pulse (!) 107, temperature 98.2 F (36.8 C), temperature source Oral, resp. rate 20, height 6\' 2"  (1.88 m), weight 111.6 kg, last menstrual period 10/11/2018, SpO2 100 %. Physical Examination: CONSTITUTIONAL: Well-developed, well-nourished female in no mild distress, sitting over trash can on arrival.  HENT:  Normocephalic, atraumatic, External right and left ear normal. Oropharynx is clear and moist EYES: Conjunctivae and EOM are normal. Pupils are equal, round, and reactive to light. No scleral icterus.  NECK: Normal range of motion, supple, no masses. SKIN: Skin is warm and dry. No rash noted. Not diaphoretic. No erythema. No pallor. NEUROLGIC: Alert and oriented to person, place, and time. Normal reflexes, muscle tone coordination. No cranial nerve deficit noted. PSYCHIATRIC: Normal mood and affect. Normal behavior. Normal judgment and thought content. CARDIOVASCULAR: Normal heart rate noted, regular rhythm RESPIRATORY: Effort and breath sounds normal, no problems with respiration noted MUSCULOSKELETAL: Normal range of motion. No edema and no tenderness. ABDOMEN: Soft, nontender, nondistended, gravid. CERVIX: deferred  Fetal monitoring: FHR: 140 bpm, Variability: moderate, Accelerations: Present,  Decelerations: Absent  Uterine activity: no contractions per hour   No results found.  Current scheduled medications . docusate sodium  100 mg Oral Daily  . pantoprazole  40 mg Oral Daily  . potassium chloride  40 mEq Oral TID  . sodium chloride flush  3 mL Intravenous Q12H    I have reviewed the patient's current medications.  ASSESSMENT: Principal Problem:   Chronic hypertension with superimposed preeclampsia Active Problems:   [redacted] weeks gestation of pregnancy   No prenatal care in current pregnancy   Elevated LFTs   Hyperemesis   Hypokalemia   PLAN: Stable chronic HTN, no meds AST/ALT trending up with no apparent cause Does not appear as superimposed severe pre-eclampsia picture S/p MFM consult GI consult today - called in to [facility] GI Labs thus far negative for source S/p BTMZ x2 Hypokalemia is being corrected, mag in process  Continue routine antenatal care.   Baldemar Lenis, M.D. Attending [facility] [employer] (Faculty Practice)  03/06/2019 9:43 AM

## 2019-03-06 NOTE — Consult Note (Addendum)
[facility] Consult: 10:18 AM 03/06/2019  LOS: 2 days    Referring Provider: Dr Emeterio Reeve  Primary Care Physician:  Patient, No Pcp Per Primary Gastroenterologist:  Althia Forts.       Reason for Consultation:  Hyperemesis in pt at 15 w 3 d pregnancy.   HPI: Jean Mata is a 28 y.o. female.  Hx bipolar d/o.  Chronic marijuana smoker.Untreated htn since age 49.  Hydrocephalus, s/p drainage and shunt 2009.      Pt pregnant G1 P0, estimated due date 06/08/09.  Chronic hypertension. Hypokalemia, hyponatremia, elevated WBCs consistently dating to late November, early December 2019.  Multiple visits to ED starting 10/2018 with N/V.  Pregnancy dx'd 11/09/2018. Initially pt planned on termination of pregnancy and has not had prenatal care.   ED visit for dysphagia 12/01/18 at which point she was started on Prevacid and Macrobid for UTI.  N/V, dysphagia improved after a few weeks of Prevacid and she discontinued its use.  Restarted this ~2 weeks ago for recurrent N/V, dysphagia at level of GE jx but the symptoms persisted.  + burning substernal chest discomfort, denies abdominal pain.  She is vomiting 5 or 6 times a day, thick brownish colored emesis, no frank blood.  Persistent hiccups.   In ED 02/24/19 with N/V/D, low abdominal cramping, .  Dxd with viral gastroenteritis, UTI.  Received IVF and Rocephin.  Discharged on Keflex 500 mg BID, prn Bismuth suspension, prn Reglan. Sometimes she vomits her meds up sometimes they seem to stay down but her nausea, vomiting, dysphagia, hiccups persist.  Constipation: last normal BM 2 weeks ago, only a smear of brown stool a few days ago.   When she presented back to the ED on 3/16 she had about 2 more days of Keflex remaining. C/o blurry vision, brief loss of vision, shaking of head  and arms lasting a few minutes.        BPs elevated 130s -140s/90s.  Heart rate 1teens.  No fever.   Admitted with suspicion of preclamsia, ? HELLP syndrome.    LFTs rising.  They were normal on 3/9, the day Keflex was RXd.   From 3/16 to today:   T bili 1.9 >> 20 >> 1.3 Alk phos 144 >> 119 >> 108 AST/ALT 191/301 >> 189/299 >> 216/349 >> 296/514.  Amylase and Lipase ok.   Hypokalemic at 2.5 Hyponatremic 131 > 133.    BUN/Creatinine ok.   Platelets normal.   WBCs 18.6 >> 15.1 >> to 23.2 from 02/24/19 >> today.   Hgb 12 Hep A IgM, Hep B surface Ag, Hep B core IgM, HCV Ab all negative.   CMV IgG elevated at 9.3, but IgM negative at less than 30.   Toxoplasma Ab IgM and Toxoplasma gondii IgG both negative.   Rubella, Monospot, RPR, HIV all negative.  U/A with bacteruria, leukosuria but poor spec with multiple squams, urine clx growing multiple species.  Tox screen positive for THC.       03/03/19 Abd ultrasound: GB sludge and tiny 3 mmm stone.  No  wall thickening or pericholecystic fluid.  CBD 4 mm.  Liver normal.  PV flow normal.   Has received betamethasone to allow for fetal lung maturity.    Pt s/o excessive salivation.     Previous heavy ETOH "4 yrs ago", then dropped to "social" 3 to 4 drinks 1 x per month, none since 07/2018.  Finished high school.  She has no trouble reading and understanding written material.  Mother s/p cholecystectomy in her 9s.  Also has GERD.      Past Medical History:  Diagnosis Date  . Asthma   . Bipolar disorder ([place])   . Depression   . Hydrocephalus ([place])   . Obesity   . Trichimoniasis     Past Surgical History:  Procedure Laterality Date  . BRAIN SURGERY      Had hydrocephalus and fluid was removed in 2009 (no shunt).     Prior to Admission medications   Medication Sig Start Date End Date Taking? Authorizing Provider  cephALEXin (KEFLEX) 500 MG capsule Take 1 capsule (500 mg total) by mouth 2 (two) times daily. 02/25/19  Yes Molpus,  John, MD  metoCLOPramide (REGLAN) 10 MG tablet Take 1 tablet (10 mg total) by mouth every 6 (six) hours as needed for nausea or vomiting. 02/25/19  Yes Molpus, John, MD  Prevacid   30 mg                      Once daily.    Scheduled Meds: . docusate sodium  100 mg Oral Daily  . pantoprazole  40 mg Oral Daily  . potassium chloride  40 mEq Oral TID  . sodium chloride flush  3 mL Intravenous Q12H   Infusions: . sodium chloride Stopped (03/04/19 1217)  . potassium chloride 10 mEq (03/06/19 1010)   PRN Meds: sodium chloride, calcium carbonate, glycopyrrolate, labetalol **AND** labetalol **AND** labetalol **AND** hydrALAZINE **AND** Measure blood pressure, metoCLOPramide, sodium chloride flush, zolpidem   Allergies as of 03/03/2019 - Review Complete 03/03/2019  Allergen Reaction Noted  . Strawberry extract Itching 11/19/2018    History reviewed. No pertinent family history.  Social History   Socioeconomic History  . Marital status: Single    Spouse name: Not on file  . Number of children: Not on file  . Years of education: Not on file  . Highest education level: Not on file  Occupational History  . Not on file  Social Needs  . Financial resource strain: Not on file  . Food insecurity:    Worry: Not on file    Inability: Not on file  . Transportation needs:    Medical: Not on file    Non-medical: Not on file  Tobacco Use  . Smoking status: Former Research scientist (life sciences)  . Smokeless tobacco: Never Used  Substance and Sexual Activity  . Alcohol use: Not Currently    Comment: last drink 09/2018  . Drug use: Yes    Types: Marijuana    Comment: everyday-last smoked 03/03/2019  . Sexual activity: Yes  Lifestyle  . Physical activity:    Days per week: Not on file    Minutes per session: Not on file  . Stress: Not on file  Relationships  . Social connections:    Talks on phone: Not on file    Gets together: Not on file    Attends religious service: Not on file    Active member of club  or organization: Not on file    Attends meetings of  clubs or organizations: Not on file    Relationship status: Not on file  . Intimate partner violence:    Fear of current or ex partner: Not on file    Emotionally abused: Not on file    Physically abused: Not on file    Forced sexual activity: Not on file  Other Topics Concern  . Not on file  Social History Narrative  . Not on file    REVIEW OF SYSTEMS: Constitutional:  Tired, weak ENT:  No nose bleeds Pulm: No breathing troubles.  No cough. CV:  No palpitations, no LE edema.  Burning substernal chest pain. GU:  No hematuria, no frequency GI: Per HPI. Heme: Denies unusual or excessive bleeding or bruising. Transfusions: None Neuro: Vision disturbance and possible seizure activity per HPI.  Photophobia Derm:  No itching, no rash or sores.  Endocrine:  No sweats or chills.  No polyuria or dysuria Immunization: Records of any previous immunizations, did not inquire as to her vaccination history. Travel:  None beyond local counties in last few months.    PHYSICAL EXAM: Vital signs in last 24 hours: Vitals:   03/06/19 0440 03/06/19 0819  BP: (!) 141/92 (!) 142/85  Pulse: 98 (!) 107  Resp:  20  Temp: 98.2 F (36.8 C) 98.2 F (36.8 C)  SpO2: 100% 100%   Wt Readings from Last 3 Encounters:  03/03/19 111.6 kg  02/24/19 111.1 kg  12/01/18 111.5 kg    General: Overweight, well-nourished appearing AAF who is laying comfortably in bed.  Does not look acutely ill. Head: No facial asymmetry or swelling.  No signs of head trauma. Eyes: No conjunctival pallor or scleral icterus. Ears: Not hard of hearing Nose: No congestion, no discharge Mouth: Tongue midline.  Fair dentition.  Cosmetic jewels applied to front teeth.  Oropharynx moist, pink, clear.  No signs of oral candidiasis or ulcers. Neck: No JVD, no bruits.  No thyromegaly. Lungs: Clear bilaterally.  No labored breathing, wheezing, cough. Heart: Mild tachycardia.  Thin  rate.  S1, S2 present. Abdomen: Not tender or distended.  No HSM, masses, bruits appreciated.  Bowel sounds active. Rectal: Deferred Musc/Skeltl: No joint redness, swelling or gross deformity. Extremities: No CCE. Neurologic: Good historian.  Fully alert and oriented.  No tremor, no limb weakness, no gross deficits. Skin: No jaundice, no palmar erythema. Tattoos: Professional quality tattoos on the upper chest Nodes: No cervical adenopathy Psych:, Pleasant, cooperative, fluid speech.  Intake/Output from previous day: 03/18 0701 - 03/19 0700 In: -  Out: 300 [Emesis/NG output:300] Intake/Output this shift: No intake/output data recorded.  LAB RESULTS: Recent Labs    03/03/19 1946 03/04/19 0548 03/05/19 0340  WBC 16.1* 15.1* 23.2*  HGB 14.3 12.2 12.0  HCT 41.2 34.2* 33.3*  PLT 401* 344 319   BMET Lab Results  Component Value Date   NA 133 (L) 03/06/2019   NA 131 (L) 03/05/2019   NA 133 (L) 03/04/2019   K 2.5 (LL) 03/06/2019   K 2.8 (L) 03/05/2019   K 2.4 (LL) 03/04/2019   CL 100 03/06/2019   CL 103 03/05/2019   CL 100 03/04/2019   CO2 22 03/06/2019   CO2 18 (L) 03/05/2019   CO2 24 03/04/2019   GLUCOSE 114 (H) 03/06/2019   GLUCOSE 117 (H) 03/05/2019   GLUCOSE 122 (H) 03/04/2019   BUN <5 (L) 03/06/2019   BUN 5 (L) 03/05/2019   BUN 8 03/04/2019   CREATININE 0.74 03/06/2019   CREATININE 0.63 03/05/2019  CREATININE 0.68 03/04/2019   CALCIUM 9.3 03/06/2019   CALCIUM 8.7 (L) 03/05/2019   CALCIUM 8.9 03/04/2019   LFT Recent Labs    03/04/19 0548 03/05/19 0340 03/06/19 0425  PROT 6.8 7.1 7.0  ALBUMIN 3.0* 3.2* 3.3*  AST 189* 216* 296*  ALT 299* 349* 514*  ALKPHOS 119 114 108  BILITOT 1.9* 2.0* 1.3*   PT/INR No results found for: INR, PROTIME Hepatitis Panel Recent Labs    03/04/19 0955  HEPBSAG Negative  HCVAB <0.1  HEPAIGM Negative  HEPBIGM Indeterminate   C-Diff No components found for: CDIFF Lipase     Component Value Date/Time   LIPASE 40  03/04/2019 0955    Drugs of Abuse     Component Value Date/Time   LABOPIA NONE DETECTED 03/04/2019 0252   COCAINSCRNUR NONE DETECTED 03/04/2019 0252   LABBENZ NONE DETECTED 03/04/2019 0252   AMPHETMU NONE DETECTED 03/04/2019 0252   THCU POSITIVE (A) 03/04/2019 0252   LABBARB NONE DETECTED 03/04/2019 0252     RADIOLOGY STUDIES: No results found.    IMPRESSION:   *    Elevated LFTs with months of N/V and more recent dysphagia in pt who is G1P1, 26 weeks 2 d/late 2nd trimester pregnancy.   Moderate htn but platelets, Hgb ok so HELP less likely/   Viral serologies, monospot and CMV IgM are negative/normal.  CMV IgG positive.  EBV testing in progress.   Small gallstone and gallbladder sludge without other biliary complications per ultrasound.  Her GI symptoms of N/V/dysphagia sound more reflux related, but may represent esophageal candidiasis or other esophageal infectious process LFTs began rising after she started Keflex, so wonder about DILI (drug-induced liver injury)? ? AIH (autoimmune hepatitis) On po Protonix and Tums.  Keflex discontinued.     PLAN:     *   LDH level pending. Check  ANA, AMA, smooth muscle Ab to r/o autoimmune hepatitis.   CMET in AM.      ? Peripheral blood smear (r/o shistocytes and burr cells), Haptoglobin? :  All to finalize r/o HELLP.    ? IV Protonix or IV Pepcid.  Scheduled IV Reglan?   ? Trial empiric anti-fungal to cover for ? esophageal candidiasis?   Azucena Freed  03/06/2019, 10:18 AM Phone [phone]    Attending Physician Note   I have taken a history, examined the patient and reviewed the chart. I agree with the Advanced Practitioner's note, impression and recommendations.  Impression: 2nd trimester pregnancy with persistent N/V, weight loss, GERD, dysphagia and elevated LFTs  Chronic hypertension with superimposed preeclampsia No additional features of her illness to support HELLP or AFLP at this time Hep A, B, C, CMV,  monospot all negative Hyperemesis gravidarum is a potential cause of elevated LFTs R/O drug induced liver injury, autoimmune hepatitis R/O choledocholithiasis - unlikely without abdominal pain or biliary dilation     Cholelithiasis, asymptomtomatic  Strongly suspected erosive esophagitis from GERD and vomiting leading to dysphagia.    Recommendations: Stop Keflex ANA, ASMA, AMA, PT/INR Trend LFTs Await EBV IgM Full liquid diet Pantoprazole 40 mg IV q12h, if ok with OB service Reglan 10 mg qid, ac & hs (not prn), if ok with OB service Consider EGD if symptoms not improving  Lucio Edward, MD [facility] [phone]

## 2019-03-07 DIAGNOSIS — R112 Nausea with vomiting, unspecified: Secondary | ICD-10-CM

## 2019-03-07 LAB — ANTINUCLEAR ANTIBODIES, IFA: ANA Ab, IFA: NEGATIVE

## 2019-03-07 LAB — COMPREHENSIVE METABOLIC PANEL
ALT: 520 U/L — ABNORMAL HIGH (ref 0–44)
AST: 233 U/L — ABNORMAL HIGH (ref 15–41)
Albumin: 2.8 g/dL — ABNORMAL LOW (ref 3.5–5.0)
Alkaline Phosphatase: 84 U/L (ref 38–126)
Anion gap: 10 (ref 5–15)
BUN: <5 mg/dL — ABNORMAL LOW (ref 6–20)
CO2: 21 mmol/L — ABNORMAL LOW (ref 22–32)
Calcium: 8.7 mg/dL — ABNORMAL LOW (ref 8.9–10.3)
Chloride: 102 mmol/L (ref 98–111)
Creatinine, Ser: 0.62 mg/dL (ref 0.44–1.00)
GFR calc Af Amer: >60 mL/min (ref >60)
GFR calc non Af Amer: >60 mL/min (ref >60)
Glucose, Bld: 108 mg/dL — ABNORMAL HIGH (ref 70–99)
POTASSIUM: 2.5 mmol/L — AB (ref 3.5–5.1)
SODIUM: 133 mmol/L — AB (ref 135–145)
Total Bilirubin: 0.7 mg/dL (ref 0.3–1.2)
Total Protein: 6 g/dL — ABNORMAL LOW (ref 6.5–8.1)

## 2019-03-07 LAB — TYPE AND SCREEN
ABO/RH(D): O POS
Antibody Screen: NEGATIVE

## 2019-03-07 LAB — MITOCHONDRIAL ANTIBODIES: Mitochondrial M2 Ab, IgG: <20 Units (ref 0.0–20.0)

## 2019-03-07 LAB — HAPTOGLOBIN: Haptoglobin: 176 mg/dL (ref 33–278)

## 2019-03-07 LAB — ANTI-SMOOTH MUSCLE ANTIBODY, IGG: F-Actin IgG: 32 Units — ABNORMAL HIGH (ref 0–19)

## 2019-03-07 MED ORDER — ENSURE ENLIVE PO LIQD
237.0000 mL | Freq: Two times a day (BID) | ORAL | Status: DC
  Administered 2019-03-07 – 2019-03-09 (×4): 237 mL via ORAL
  Filled 2019-03-07 (×6): qty 237

## 2019-03-07 MED ORDER — POTASSIUM CHLORIDE CRYS ER 20 MEQ PO TBCR: 80.0000 meq | EXTENDED_RELEASE_TABLET | Freq: Three times a day (TID) | ORAL | Status: DC

## 2019-03-07 MED ORDER — MAGNESIUM SULFATE 2 GM/50ML IV SOLN
2.0000 g | Freq: Once | INTRAVENOUS | Status: AC
  Administered 2019-03-07: 2 g via INTRAVENOUS
  Filled 2019-03-07: qty 50

## 2019-03-07 MED ORDER — POTASSIUM CHLORIDE IN NACL 40-0.9 MEQ/L-% IV SOLN
INTRAVENOUS | Status: DC
  Administered 2019-03-07 – 2019-03-08 (×4): 125 mL/h via INTRAVENOUS
  Filled 2019-03-07 (×5): qty 1000

## 2019-03-07 MED ORDER — POTASSIUM CHLORIDE CRYS ER 20 MEQ PO TBCR
40.0000 meq | EXTENDED_RELEASE_TABLET | Freq: Four times a day (QID) | ORAL | Status: DC
  Administered 2019-03-07 – 2019-03-08 (×6): 40 meq via ORAL
  Filled 2019-03-07 (×7): qty 2

## 2019-03-07 NOTE — Progress Notes (Signed)
Patient ID: [name], female   DOB: 07-27-1991, 28 y.o.   MRN: [id number] FACULTY PRACTICE ANTEPARTUM(COMPREHENSIVE) NOTE  Joline Farless is a 28 y.o. G1P0 at [redacted]w[redacted]d by best clinical estimate who is admitted for nausea and vomiting CHTN  Fetal presentation is cephalic. Length of Stay:  3  Days  Subjective: Hungry, no nausea Patient reports the fetal movement as active. Patient reports uterine contraction  activity as none. Patient reports  vaginal bleeding as none. Patient describes fluid per vagina as None.  Vitals:  Blood pressure 106/79, pulse (!) 112, temperature 98.4 F (36.9 C), temperature source Axillary, resp. rate 18, height 6\' 2"  (1.88 m), weight 111.6 kg, last menstrual period 10/11/2018, SpO2 100 %. Physical Examination:  General appearance - alert, well appearing, and in no distress Heart - normal rate and regular rhythm Abdomen - soft, nontender, nondistended Fundal Height:  size equals dates Cervical Exam: Not evaluated.  Extremities: extremities normal, atraumatic, no cyanosis or edema and Homans sign is negative, no sign of DVT  Membranes:intact  Fetal Monitoring:  Baseline: 150 bpm, Variability: Fair (1-6 bpm), Accelerations: Non-reactive but appropriate for gestational age and Decelerations: Absent  Labs:  Results for orders placed or performed during the hospital encounter of 03/03/19 (from the past 24 hour(s))  Haptoglobin   Collection Time: 03/06/19  2:08 PM  Result Value Ref Range   Haptoglobin 176 33 - 278 mg/dL  Protime-INR Once   Collection Time: 03/06/19  2:08 PM  Result Value Ref Range   Prothrombin Time 13.2 11.4 - 15.2 seconds   INR 1.0 0.8 - 1.2  Comprehensive metabolic panel   Collection Time: 03/07/19  4:35 AM  Result Value Ref Range   Sodium 133 (L) 135 - 145 mmol/L   Potassium 2.5 (LL) 3.5 - 5.1 mmol/L   Chloride 102 98 - 111 mmol/L   CO2 21 (L) 22 - 32 mmol/L   Glucose, Bld 108 (H) 70 - 99 mg/dL   BUN <5 (L) 6 - 20 mg/dL   Creatinine, Ser 1.83 0.44 - 1.00 mg/dL   Calcium 8.7 (L) 8.9 - 10.3 mg/dL   Total Protein 6.0 (L) 6.5 - 8.1 g/dL   Albumin 2.8 (L) 3.5 - 5.0 g/dL   AST 358 (H) 15 - 41 U/L   ALT 520 (H) 0 - 44 U/L   Alkaline Phosphatase 84 38 - 126 U/L   Total Bilirubin 0.7 0.3 - 1.2 mg/dL   GFR calc non Af Amer >60 >60 mL/min   GFR calc Af Amer >60 >60 mL/min   Anion gap 10 5 - 15  Type and screen [facility]   Collection Time: 03/07/19  4:35 AM  Result Value Ref Range   ABO/RH(D) O POS    Antibody Screen NEG    Sample Expiration      03/10/2019 Performed at [facility], [address], [place], [place] [address]      Medications:  Scheduled . docusate sodium  100 mg Oral Daily  . metoCLOPramide  10 mg Oral TID WC & HS  . pantoprazole (PROTONIX) IV  40 mg Intravenous Q12H  . potassium chloride  40 mEq Oral TID  . sodium chloride flush  3 mL Intravenous Q12H   I have reviewed the patient's current medications.  ASSESSMENT: Patient Active Problem List   Diagnosis Date Noted  . Chronic hypertension with superimposed preeclampsia 03/04/2019  . [redacted] weeks gestation of pregnancy 03/04/2019  . No prenatal care in current pregnancy  03/04/2019  . Elevated LFTs 03/04/2019  . Hyperemesis 03/04/2019  . Hypokalemia 03/04/2019    PLAN: Continue to replenish potassium orally Await results from hepatitis workup Advance diet per her request  Scheryl Darter 03/07/2019,7:27 AM

## 2019-03-07 NOTE — Progress Notes (Signed)
      Progress Note   Subjective  Patient reports feeling better today. Nausea and vomiting controlled, she is hoping to eat. No pain. She denies itching.    Objective   Vital signs in last 24 hours: Temp:  [97.5 F (36.4 C)-98.6 F (37 C)] 98.6 F (37 C) (03/20 1130) Pulse Rate:  [94-112] 102 (03/20 1130) Resp:  [16-20] 16 (03/20 1130) BP: (106-136)/(70-95) 113/83 (03/20 1130) SpO2:  [100 %] 100 % (03/20 1135)    Exam unchanged from yesterday   Intake/Output from previous day: No intake/output data recorded. Intake/Output this shift: No intake/output data recorded.  Lab Results: Recent Labs    03/05/19 0340  WBC 23.2*  HGB 12.0  HCT 33.3*  PLT 319   BMET Recent Labs    03/05/19 0340 03/06/19 0425 03/07/19 0435  NA 131* 133* 133*  K 2.8* 2.5* 2.5*  CL 103 100 102  CO2 18* 22 21*  GLUCOSE 117* 114* 108*  BUN 5* <5* <5*  CREATININE 0.63 0.74 0.62  CALCIUM 8.7* 9.3 8.7*   LFT Recent Labs    03/07/19 0435  PROT 6.0*  ALBUMIN 2.8*  AST 233*  ALT 520*  ALKPHOS 84  BILITOT 0.7   PT/INR Recent Labs    03/06/19 1408  LABPROT 13.2  INR 1.0    Studies/Results: No results found.     Assessment / Plan:   28 y/o female, second trimester pregnancy, admitted with persistent nausea / vomiting, weight loss, and elevated liver enzymes with baseline HTN and ? Pre-eclampsia. She's recently been on Keflex. No evidence of HELLP or AFLP. Bile salts mildly elevated but she has no pruritus or jaundice, although this can precede the development of cholestasis of pregnancy. ALT about the same today, AST has come down which is a good sign. US shows tiny gallstones, normal vasculature and parenchyma. 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  Ddx includes drug induced liver injury (DILI) from the antibiotics which have been held. Again cholestasis of pregnancy is on the differential with  the bile salts being slightly elevated although typically transaminases are not > 2 x's ULN, and she has no pruritus, but possible and may consider repeating bile salts pending course or trial of ursodiol. I would send complete serologic evaluation, ensure no HSV, will send HCV RNA to ensure no acute HCV. I will send IgG in light of the SMA positive, however with ANA and total protein normal, I think autoimmune hep is less likely. Ceruloplasmin will be sent given her age. Hyperemesis gravidarum and pre-ecclampsia can also cause elevations in liver enzymes, the former seeming better controlled and will try a diet today.   Would continue supportive care in addition to labs as above, she wants to eat so will provide a diet today. If LFTs continue to downtrend this would make drug induced more likely, but will await full serologies.   Will follow, call with questions. Dr. Chales Abrahams to follow this weekend.  Ileene Patrick, MD [facility]

## 2019-03-07 NOTE — Progress Notes (Signed)
Pt informed of dietary  Change to  Full liquids  Not regular  Diet

## 2019-03-07 NOTE — Progress Notes (Signed)
Initial Nutrition Assessment  DOCUMENTATION CODES:  Obesity unspecified  INTERVENTION:  Currently Ordered F/L diet Ensure BID, due to lack of weight gain and higher caloric needs associated with Pt's Ht/wt  NUTRITION DIAGNOSIS:   Increased nutrient needs related to (pregnancy and fetal growth requirements) as evidenced by (26 weeks IUP).  GOAL:  Patient will meet greater than or equal to 90% of their needs, Weight gain  MONITOR:  Weight trends  REASON FOR ASSESSMENT:  Antenatal   ASSESSMENT:   26 4/7 weeks, adm with hyperemesis and CHTN. No PNC. weight at 8 weeks 111.5 kg, BMI 31.6.  No weight gain   Diet Order:   Diet Order            Diet full liquid Room service appropriate? Yes; Fluid consistency: Thin  Diet effective now             EDUCATION NEEDS:   No education needs have been identified at this time  Skin:  Skin Assessment: Reviewed RN Assessment   Height:   Ht Readings from Last 1 Encounters:  03/03/19 6\' 2"  (1.88 m)    Weight:   Wt Readings from Last 1 Encounters:  03/03/19 111.6 kg    Ideal Body Weight:   170 lbs  BMI:  Body mass index is 31.58 kg/m.  Estimated Nutritional Needs:   Kcal:  2500-2700   Protein:  110-120 g  Fluid:  2.8 L    Elisabeth Cara M.Odis Luster LDN Neonatal Nutrition Support Specialist/RD III Pager [phone]      Phone [phone]

## 2019-03-08 LAB — COMPREHENSIVE METABOLIC PANEL
ALT: 315 U/L — ABNORMAL HIGH (ref 0–44)
AST: 98 U/L — ABNORMAL HIGH (ref 15–41)
Albumin: 2.4 g/dL — ABNORMAL LOW (ref 3.5–5.0)
Alkaline Phosphatase: 67 U/L (ref 38–126)
Anion gap: 7 (ref 5–15)
BUN: <5 mg/dL — ABNORMAL LOW (ref 6–20)
CO2: 20 mmol/L — AB (ref 22–32)
Calcium: 8.6 mg/dL — ABNORMAL LOW (ref 8.9–10.3)
Chloride: 110 mmol/L (ref 98–111)
Creatinine, Ser: 0.52 mg/dL (ref 0.44–1.00)
GFR calc Af Amer: >60 mL/min (ref >60)
GFR calc non Af Amer: >60 mL/min (ref >60)
Glucose, Bld: 91 mg/dL (ref 70–99)
Potassium: 4.3 mmol/L (ref 3.5–5.1)
SODIUM: 137 mmol/L (ref 135–145)
Total Bilirubin: 0.4 mg/dL (ref 0.3–1.2)
Total Protein: 5.3 g/dL — ABNORMAL LOW (ref 6.5–8.1)

## 2019-03-08 LAB — BASIC METABOLIC PANEL
Anion gap: 1 — ABNORMAL LOW (ref 5–15)
BUN: <5 mg/dL — ABNORMAL LOW (ref 6–20)
CO2: 22 mmol/L (ref 22–32)
Calcium: 8.7 mg/dL — ABNORMAL LOW (ref 8.9–10.3)
Chloride: 113 mmol/L — ABNORMAL HIGH (ref 98–111)
Creatinine, Ser: 0.56 mg/dL (ref 0.44–1.00)
GFR calc Af Amer: >60 mL/min (ref >60)
GFR calc non Af Amer: >60 mL/min (ref >60)
GLUCOSE: 89 mg/dL (ref 70–99)
Potassium: 4.9 mmol/L (ref 3.5–5.1)
Sodium: 136 mmol/L (ref 135–145)

## 2019-03-08 LAB — CBC
HCT: 27.2 % — ABNORMAL LOW (ref 36.0–46.0)
Hemoglobin: 9 g/dL — ABNORMAL LOW (ref 12.0–15.0)
MCH: 29.8 pg (ref 26.0–34.0)
MCHC: 33.1 g/dL (ref 30.0–36.0)
MCV: 90.1 fL (ref 80.0–100.0)
Platelets: 256 10*3/uL (ref 150–400)
RBC: 3.02 MIL/uL — ABNORMAL LOW (ref 3.87–5.11)
RDW: 13.3 % (ref 11.5–15.5)
WBC: 18.3 10*3/uL — ABNORMAL HIGH (ref 4.0–10.5)
nRBC: 0 % (ref 0.0–0.2)

## 2019-03-08 LAB — CERULOPLASMIN: CERULOPLASMIN: 49.4 mg/dL — AB (ref 19.0–39.0)

## 2019-03-08 LAB — IGG: IGG (IMMUNOGLOBIN G), SERUM: 1074 mg/dL (ref 700–1600)

## 2019-03-08 LAB — HEPATITIS B CORE ANTIBODY, IGM: Hep B C IgM: UNDETERMINED

## 2019-03-08 MED ORDER — PANTOPRAZOLE SODIUM 40 MG PO TBEC
40.0000 mg | DELAYED_RELEASE_TABLET | Freq: Two times a day (BID) | ORAL | Status: DC
  Administered 2019-03-08 – 2019-03-09 (×2): 40 mg via ORAL
  Filled 2019-03-08 (×2): qty 1

## 2019-03-08 NOTE — Progress Notes (Addendum)
Patient ID: Jean Mata, female   DOB: 1991/10/28, 28 y.o.   MRN: [id number] FACULTY PRACTICE ANTEPARTUM(COMPREHENSIVE) NOTE  Jean Mata is a 28 y.o. G1P0 at [redacted]w[redacted]d by best clinical estimate who is admitted for nausea and vomiting CHTN  Fetal presentation is cephalic. Length of Stay:  4  Days  Subjective: Patient is doing well. She reports that she tolerated her regular diet yesterday without abdominal pain or nausea/emesis Patient reports the fetal movement as active. Patient reports uterine contraction  activity as none. Patient reports  vaginal bleeding as none. Patient describes fluid per vagina as None.  Vitals:  Blood pressure 101/70, pulse 91, temperature 98.1 F (36.7 C), temperature source Oral, resp. rate 16, height 6\' 2"  (1.88 m), weight 111.6 kg, last menstrual period 10/11/2018, SpO2 100 %. Physical Examination:  General appearance - alert, well appearing, and in no distress Heart - normal rate and regular rhythm Abdomen - soft, nontender, nondistended Fundal Height:  size equals dates Cervical Exam: Not evaluated.  Extremities: extremities normal, atraumatic, no cyanosis or edema and Homans sign is negative, no sign of DVT  Membranes:intact  Fetal Monitoring:  Baseline: 150 bpm, Variability: Fair (1-6 bpm), Accelerations: Non-reactive but appropriate for gestational age and Decelerations: Absent  Labs:  Results for orders placed or performed during the hospital encounter of 03/03/19 (from the past 24 hour(s))  Hepatitis B core antibody, IgM   Collection Time: 03/07/19  4:38 PM  Result Value Ref Range   Hep B C IgM Indeterminate Negative  IgG   Collection Time: 03/07/19  4:38 PM  Result Value Ref Range   IgG (Immunoglobin G), Serum 1,074 700 - 1,600 mg/dL  Ceruloplasmin   Collection Time: 03/07/19  4:38 PM  Result Value Ref Range   Ceruloplasmin 49.4 (H) 19.0 - 39.0 mg/dL  Comprehensive metabolic panel   Collection Time: 03/08/19  6:03 AM  Result Value Ref  Range   Sodium 137 135 - 145 mmol/L   Potassium 4.3 3.5 - 5.1 mmol/L   Chloride 110 98 - 111 mmol/L   CO2 20 (L) 22 - 32 mmol/L   Glucose, Bld 91 70 - 99 mg/dL   BUN <5 (L) 6 - 20 mg/dL   Creatinine, Ser 2.56 0.44 - 1.00 mg/dL   Calcium 8.6 (L) 8.9 - 10.3 mg/dL   Total Protein 5.3 (L) 6.5 - 8.1 g/dL   Albumin 2.4 (L) 3.5 - 5.0 g/dL   AST 98 (H) 15 - 41 U/L   ALT 315 (H) 0 - 44 U/L   Alkaline Phosphatase 67 38 - 126 U/L   Total Bilirubin 0.4 0.3 - 1.2 mg/dL   GFR calc non Af Amer >60 >60 mL/min   GFR calc Af Amer >60 >60 mL/min   Anion gap 7 5 - 15  CBC   Collection Time: 03/08/19  6:03 AM  Result Value Ref Range   WBC 18.3 (H) 4.0 - 10.5 K/uL   RBC 3.02 (L) 3.87 - 5.11 MIL/uL   Hemoglobin 9.0 (L) 12.0 - 15.0 g/dL   HCT 38.9 (L) 37.3 - 42.8 %   MCV 90.1 80.0 - 100.0 fL   MCH 29.8 26.0 - 34.0 pg   MCHC 33.1 30.0 - 36.0 g/dL   RDW 76.8 11.5 - 72.6 %   Platelets 256 150 - 400 K/uL   nRBC 0.0 0.0 - 0.2 %     Medications:  Scheduled . docusate sodium  100 mg Oral Daily  . feeding supplement (ENSURE ENLIVE)  237  mL Oral BID BM  . metoCLOPramide  10 mg Oral TID WC & HS  . pantoprazole (PROTONIX) IV  40 mg Intravenous Q12H  . potassium chloride  40 mEq Oral Q6H  . sodium chloride flush  3 mL Intravenous Q12H   I have reviewed the patient's current medications.  ASSESSMENT: Patient Active Problem List   Diagnosis Date Noted  . Chronic hypertension with superimposed preeclampsia 03/04/2019  . [redacted] weeks gestation of pregnancy 03/04/2019  . No prenatal care in current pregnancy 03/04/2019  . Elevated LFTs 03/04/2019  . Non-intractable vomiting 03/04/2019  . Hypokalemia 03/04/2019    PLAN: BP stable without antihypertensives Await results from hepatitis workup LFTs trending down Follow up GI recommendations Continue current care  Doye Montilla 03/08/2019,9:39 AM

## 2019-03-08 NOTE — Progress Notes (Signed)
     [facility] Progress Note     ASSESSMENT AND PLAN:  28 year old [redacted] weeks pregnant admitted with preeclampsia/hyperemesis. D/d includes drug-induced liver injury from antibiotics.  Feels much better today. LFTs trending down very well today. Neg [place] except for small gallstones, negative acute hep panel, ANA but SMA positive. Doubt autoimmune hepatitis.  Plan: -Continue supportive care. -Trend LFTs -Follow pending serologies and HCVRNA. -Recommend follow-up in the GI clinic after delivery.  If she still has abnormal LFTs, will consider liver biopsy.  -We will sign off for now.       Feeling much better today.  Tolerated regular diet.  No nausea or vomiting.  Had bowel movement as well.  Vital signs in last 24 hours: Temp:  [97.9 F (36.6 C)-98.2 F (36.8 C)] 98.1 F (36.7 C) (03/21 0800) Pulse Rate:  [91-140] 91 (03/21 0800) Resp:  [15-19] 16 (03/21 0800) BP: (101-119)/(69-76) 101/70 (03/21 0800) SpO2:  [97 %-100 %] 100 % (03/21 0800)   General:   Alert, well-developed,    in NAD EENT:  Normal hearing, non icteric sclera, conjunctive pink.  Heart:  Regular rate and rhythm; no murmurs.  Pulm: Normal respiratory effort, lungs CTA bilaterally without wheezes or crackles. Abdomen:  Soft, nondistended, nontender.  Normal bowel sounds, no masses felt. No hepatomegaly.    Neurologic:  Alert and  oriented x4;  grossly normal neurologically. Psych:  Pleasant, cooperative.  Normal mood and affect.   Intake/Output from previous day: No intake/output data recorded. Intake/Output this shift: No intake/output data recorded.  Lab Results: Recent Labs    03/08/19 0603  WBC 18.3*  HGB 9.0*  HCT 27.2*  PLT 256   BMET Recent Labs    03/06/19 0425 03/07/19 0435 03/08/19 0603  NA 133* 133* 137  K 2.5* 2.5* 4.3  CL 100 102 110  CO2 22 21* 20*  GLUCOSE 114* 108* 91  BUN <5* <5* <5*  CREATININE 0.74 0.62 0.52  CALCIUM 9.3 8.7* 8.6*   LFT Recent Labs   03/08/19 0603  PROT 5.3*  ALBUMIN 2.4*  AST 98*  ALT 315*  ALKPHOS 67  BILITOT 0.4   PT/INR Recent Labs    03/06/19 1408  LABPROT 13.2  INR 1.0   Hepatitis Panel Recent Labs    03/07/19 1638  HEPBIGM Indeterminate       Principal Problem:   Chronic hypertension with superimposed preeclampsia Active Problems:   [redacted] weeks gestation of pregnancy   No prenatal care in current pregnancy   Elevated LFTs   Non-intractable vomiting   Hypokalemia     LOS: 4 days   Lynann Bologna ,MD 03/08/2019, 11:41 AM

## 2019-03-09 LAB — HSV DNA BY PCR (REFERENCE LAB)
HSV 1 DNA: NEGATIVE
HSV 2 DNA: NEGATIVE

## 2019-03-09 MED ORDER — GLYCOPYRROLATE 0.2 MG/ML IJ SOLN: 0.1000 mg | Freq: Three times a day (TID) | INTRAMUSCULAR | 2 refills | Status: DC | PRN

## 2019-03-09 MED ORDER — PANTOPRAZOLE SODIUM 40 MG PO TBEC: 40.0000 mg | DELAYED_RELEASE_TABLET | Freq: Two times a day (BID) | ORAL | 1 refills | Status: DC

## 2019-03-09 MED ORDER — GLYCOPYRROLATE 2 MG PO TABS: 2.0000 mg | ORAL_TABLET | Freq: Three times a day (TID) | ORAL | 1 refills | Status: DC

## 2019-03-09 NOTE — Discharge Summary (Signed)
Antenatal Physician Discharge Summary  Patient ID: Jean Mata MRN: [id number] DOB/AGE: 27/27/1992 27 y.o.  Admit date: 03/03/2019 Discharge date: 03/09/2019  Admission Diagnoses: Principal Problem:   Chronic hypertension with superimposed preeclampsia Active Problems:   [redacted] weeks gestation of pregnancy   No prenatal care in current pregnancy   Elevated LFTs   Non-intractable vomiting   Hypokalemia    Discharge Diagnoses: Same, no evidence of pre-eclampsia  Prenatal Procedures: none  Consults: Gastroenterology  Hospital Course:  This is a 28 y.o. G1P0 with IUP at 103w6d admitted for hyperemesis with marked elevation of LFT's and some elevated BPs. BPs were elevated, but came down with no meds. Patient had GI consult, who ran a lot of tests, with no easy answer for how high her LFTs were. They improved without any intervention. Her N/V were improved with PPI, anti-emetics and Robinul. She felt much better. GI signed off. She was deemed stable for discharge to home with outpatient follow up.  Discharge Exam: Temp:  [97.8 F (36.6 C)-98.2 F (36.8 C)] 97.8 F (36.6 C) (03/22 1154) Pulse Rate:  [92-101] 99 (03/22 1154) Resp:  [16] 16 (03/22 1154) BP: (110-119)/(66-90) 114/69 (03/22 1154) SpO2:  [100 %] 100 % (03/22 1154) Physical Examination: CONSTITUTIONAL: Well-developed, well-nourished female in no acute distress.  HENT:  Normocephalic, atraumatic, External right and left ear normal. Oropharynx is clear and moist EYES: Conjunctivae and EOM are normal. Pupils are equal, round, and reactive to light. No scleral icterus.  NECK: Normal range of motion, supple, no masses SKIN: Skin is warm and dry. No rash noted. Not diaphoretic. No erythema. No pallor. NEUROLGIC: Alert and oriented to person, place, and time. Normal reflexes, muscle tone coordination. No cranial nerve deficit noted. PSYCHIATRIC: Normal mood and affect. Normal behavior. Normal judgment and thought  content. CARDIOVASCULAR: Normal heart rate noted, regular rhythm RESPIRATORY: Effort and breath sounds normal, no problems with respiration noted MUSCULOSKELETAL: Normal range of motion. No edema and no tenderness. 2+ distal pulses. ABDOMEN: Soft, nontender, nondistended, gravid.  Fetal monitoring: FHR: 150 bpm, Variability: moderate, Accelerations: 10 x 10 Present, Decelerations: Absent  Uterine activity: no contractions per hour  Significant Diagnostic Studies:  Results for orders placed or performed during the hospital encounter of 03/03/19 (from the past 168 hour(s))  Comprehensive metabolic panel   Collection Time: 03/03/19  7:46 PM  Result Value Ref Range   Sodium 131 (L) 135 - 145 mmol/L   Potassium 2.3 (LL) 3.5 - 5.1 mmol/L   Chloride 96 (L) 98 - 111 mmol/L   CO2 21 (L) 22 - 32 mmol/L   Glucose, Bld 105 (H) 70 - 99 mg/dL   BUN 13 6 - 20 mg/dL   Creatinine, Ser 4.09 0.44 - 1.00 mg/dL   Calcium 9.4 8.9 - 81.1 mg/dL   Total Protein 8.4 (H) 6.5 - 8.1 g/dL   Albumin 3.8 3.5 - 5.0 g/dL   AST 914 (H) 15 - 41 U/L   ALT 301 (H) 0 - 44 U/L   Alkaline Phosphatase 144 (H) 38 - 126 U/L   Total Bilirubin 1.9 (H) 0.3 - 1.2 mg/dL   GFR calc non Af Amer >60 >60 mL/min   GFR calc Af Amer >60 >60 mL/min   Anion gap 14 5 - 15  CBC with Differential   Collection Time: 03/03/19  7:46 PM  Result Value Ref Range   WBC 16.1 (H) 4.0 - 10.5 K/uL   RBC 4.74 3.87 - 5.11 MIL/uL   Hemoglobin 14.3 12.0 -  15.0 g/dL   HCT 14.9 70.2 - 63.7 %   MCV 86.9 80.0 - 100.0 fL   MCH 30.2 26.0 - 34.0 pg   MCHC 34.7 30.0 - 36.0 g/dL   RDW 85.8 85.0 - 27.7 %   Platelets 401 (H) 150 - 400 K/uL   nRBC 0.0 0.0 - 0.2 %   Neutrophils Relative % 69 %   Neutro Abs 11.0 (H) 1.7 - 7.7 K/uL   Lymphocytes Relative 23 %   Lymphs Abs 3.8 0.7 - 4.0 K/uL   Monocytes Relative 7 %   Monocytes Absolute 1.1 (H) 0.1 - 1.0 K/uL   Eosinophils Relative 0 %   Eosinophils Absolute 0.1 0.0 - 0.5 K/uL   Basophils Relative 0 %    Basophils Absolute 0.0 0.0 - 0.1 K/uL   Immature Granulocytes 1 %   Abs Immature Granulocytes 0.11 (H) 0.00 - 0.07 K/uL  Lipase, blood   Collection Time: 03/03/19  7:46 PM  Result Value Ref Range   Lipase 41 11 - 51 U/L  Hepatitis B surface antigen   Collection Time: 03/04/19  2:20 AM  Result Value Ref Range   Hepatitis B Surface Ag Negative Negative  Rubella screen   Collection Time: 03/04/19  2:20 AM  Result Value Ref Range   Rubella 1.12 Immune >0.99 index  RPR   Collection Time: 03/04/19  2:20 AM  Result Value Ref Range   RPR Ser Ql Non Reactive Non Reactive  HIV antibody (routine testing)   Collection Time: 03/04/19  2:20 AM  Result Value Ref Range   HIV Screen 4th Generation wRfx Non Reactive Non Reactive  Hemoglobin A1c   Collection Time: 03/04/19  2:20 AM  Result Value Ref Range   Hgb A1c MFr Bld 4.9 4.8 - 5.6 %   Mean Plasma Glucose 93.93 mg/dL  Type and screen [facility]   Collection Time: 03/04/19  2:20 AM  Result Value Ref Range   ABO/RH(D) O POS    Antibody Screen NEG    Sample Expiration      03/07/2019 Performed at [facility], [address] [place], [place] [address]   ABO/Rh   Collection Time: 03/04/19  2:20 AM  Result Value Ref Range   ABO/RH(D) O POS    No rh immune globuloin      NOT A RH IMMUNE GLOBULIN CANDIDATE, PT RH POSITIVE Performed at [facility], [address], [place], [place] [address]   Urine rapid drug screen (hosp performed)not at [facility]   Collection Time: 03/04/19  2:52 AM  Result Value Ref Range   Opiates NONE DETECTED NONE DETECTED   Cocaine NONE DETECTED NONE DETECTED   Benzodiazepines NONE DETECTED NONE DETECTED   Amphetamines NONE DETECTED NONE DETECTED   Tetrahydrocannabinol POSITIVE (A) NONE DETECTED   Barbiturates NONE DETECTED NONE DETECTED  Protein / creatinine ratio, urine   Collection Time: 03/04/19  2:52 AM  Result Value Ref Range   Creatinine, Urine 221.80 mg/dL   Total Protein,  Urine 50 mg/dL   Protein Creatinine Ratio 0.23 (H) 0.00 - 0.15 mg/mg[Cre]  Protein, urine, 24 hour   Collection Time: 03/04/19  4:00 AM  Result Value Ref Range   Urine Total Volume-UPROT 1,000 mL   Collection Interval-UPROT 24 hours   Protein, Urine 47 mg/dL   Protein, 20N Urine 470 (H) 50 - 100 mg/day  CBC   Collection Time: 03/04/19  5:48 AM  Result Value Ref Range  WBC 15.1 (H) 4.0 - 10.5 K/uL   RBC 3.98 3.87 - 5.11 MIL/uL   Hemoglobin 12.2 12.0 - 15.0 g/dL   HCT 16.1 (L) 09.6 - 04.5 %   MCV 85.9 80.0 - 100.0 fL   MCH 30.7 26.0 - 34.0 pg   MCHC 35.7 30.0 - 36.0 g/dL   RDW 40.9 81.1 - 91.4 %   Platelets 344 150 - 400 K/uL   nRBC 0.0 0.0 - 0.2 %  Comprehensive metabolic panel   Collection Time: 03/04/19  5:48 AM  Result Value Ref Range   Sodium 133 (L) 135 - 145 mmol/L   Potassium 2.4 (LL) 3.5 - 5.1 mmol/L   Chloride 100 98 - 111 mmol/L   CO2 24 22 - 32 mmol/L   Glucose, Bld 122 (H) 70 - 99 mg/dL   BUN 8 6 - 20 mg/dL   Creatinine, Ser 7.82 0.44 - 1.00 mg/dL   Calcium 8.9 8.9 - 95.6 mg/dL   Total Protein 6.8 6.5 - 8.1 g/dL   Albumin 3.0 (L) 3.5 - 5.0 g/dL   AST 213 (H) 15 - 41 U/L   ALT 299 (H) 0 - 44 U/L   Alkaline Phosphatase 119 38 - 126 U/L   Total Bilirubin 1.9 (H) 0.3 - 1.2 mg/dL   GFR calc non Af Amer >60 >60 mL/min   GFR calc Af Amer >60 >60 mL/min   Anion gap 9 5 - 15  Hepatitis panel, acute   Collection Time: 03/04/19  9:55 AM  Result Value Ref Range   Hepatitis B Surface Ag Negative Negative   HCV Ab <0.1 0.0 - 0.9 s/co ratio   Hep A IgM Negative Negative   Hep B C IgM Indeterminate Negative  Amylase   Collection Time: 03/04/19  9:55 AM  Result Value Ref Range   Amylase 85 28 - 100 U/L  Lipase, blood   Collection Time: 03/04/19  9:55 AM  Result Value Ref Range   Lipase 40 11 - 51 U/L  Comprehensive metabolic panel   Collection Time: 03/05/19  3:40 AM  Result Value Ref Range   Sodium 131 (L) 135 - 145 mmol/L   Potassium 2.8 (L) 3.5 - 5.1 mmol/L    Chloride 103 98 - 111 mmol/L   CO2 18 (L) 22 - 32 mmol/L   Glucose, Bld 117 (H) 70 - 99 mg/dL   BUN 5 (L) 6 - 20 mg/dL   Creatinine, Ser 0.86 0.44 - 1.00 mg/dL   Calcium 8.7 (L) 8.9 - 10.3 mg/dL   Total Protein 7.1 6.5 - 8.1 g/dL   Albumin 3.2 (L) 3.5 - 5.0 g/dL   AST 578 (H) 15 - 41 U/L   ALT 349 (H) 0 - 44 U/L   Alkaline Phosphatase 114 38 - 126 U/L   Total Bilirubin 2.0 (H) 0.3 - 1.2 mg/dL   GFR calc non Af Amer >60 >60 mL/min   GFR calc Af Amer >60 >60 mL/min   Anion gap 10 5 - 15  CBC   Collection Time: 03/05/19  3:40 AM  Result Value Ref Range   WBC 23.2 (H) 4.0 - 10.5 K/uL   RBC 3.95 3.87 - 5.11 MIL/uL   Hemoglobin 12.0 12.0 - 15.0 g/dL   HCT 46.9 (L) 62.9 - 52.8 %   MCV 84.3 80.0 - 100.0 fL   MCH 30.4 26.0 - 34.0 pg   MCHC 36.0 30.0 - 36.0 g/dL   RDW 41.3 24.4 - 01.0 %  Platelets 319 150 - 400 K/uL   nRBC 0.0 0.0 - 0.2 %  EBV ab to viral capsid ag pnl, IgG+IgM   Collection Time: 03/05/19  1:19 PM  Result Value Ref Range   EBV VCA IgG >600.0 (H) 0.0 - 17.9 U/mL   EBV VCA IgM <36.0 0.0 - 35.9 U/mL  Mononucleosis screen   Collection Time: 03/05/19  1:19 PM  Result Value Ref Range   Mono Screen NEGATIVE NEGATIVE  Bile acids, total   Collection Time: 03/05/19  2:28 PM  Result Value Ref Range   Bile Acids Total 12.2 (H) 0.0 - 10.0 umol/L  CMV IgM   Collection Time: 03/05/19  2:28 PM  Result Value Ref Range   CMV IgM <30.0 0.0 - 29.9 AU/mL  Toxoplasma antibodies- IgG and  IgM   Collection Time: 03/05/19  2:28 PM  Result Value Ref Range   Toxoplasma IgG Ratio <3.0 0.0 - 7.1 IU/mL   Toxoplasma Antibody- IgM <3.0 0.0 - 7.9 AU/mL   Comment Comment   Cmv antibody, IgG (EIA)   Collection Time: 03/05/19  2:28 PM  Result Value Ref Range   CMV Ab - IgG 9.30 (H) 0.00 - 0.59 U/mL  Lactate dehydrogenase   Collection Time: 03/05/19  2:28 PM  Result Value Ref Range   LDH 186 98 - 192 U/L  Comprehensive metabolic panel   Collection Time: 03/06/19  4:25 AM  Result  Value Ref Range   Sodium 133 (L) 135 - 145 mmol/L   Potassium 2.5 (LL) 3.5 - 5.1 mmol/L   Chloride 100 98 - 111 mmol/L   CO2 22 22 - 32 mmol/L   Glucose, Bld 114 (H) 70 - 99 mg/dL   BUN <5 (L) 6 - 20 mg/dL   Creatinine, Ser 1.61 0.44 - 1.00 mg/dL   Calcium 9.3 8.9 - 09.6 mg/dL   Total Protein 7.0 6.5 - 8.1 g/dL   Albumin 3.3 (L) 3.5 - 5.0 g/dL   AST 045 (H) 15 - 41 U/L   ALT 514 (H) 0 - 44 U/L   Alkaline Phosphatase 108 38 - 126 U/L   Total Bilirubin 1.3 (H) 0.3 - 1.2 mg/dL   GFR calc non Af Amer >60 >60 mL/min   GFR calc Af Amer >60 >60 mL/min   Anion gap 11 5 - 15  Magnesium   Collection Time: 03/06/19  4:25 AM  Result Value Ref Range   Magnesium 1.9 1.7 - 2.4 mg/dL  ANA, IFA (with reflex)   Collection Time: 03/06/19  2:08 PM  Result Value Ref Range   ANA Ab, IFA Negative   Anti-smooth muscle antibody, IgG   Collection Time: 03/06/19  2:08 PM  Result Value Ref Range   F-Actin IgG 32 (H) 0 - 19 Units  Haptoglobin   Collection Time: 03/06/19  2:08 PM  Result Value Ref Range   Haptoglobin 176 33 - 278 mg/dL  Mitochondrial antibodies   Collection Time: 03/06/19  2:08 PM  Result Value Ref Range   Mitochondrial M2 Ab, IgG <20.0 0.0 - 20.0 Units  Protime-INR Once   Collection Time: 03/06/19  2:08 PM  Result Value Ref Range   Prothrombin Time 13.2 11.4 - 15.2 seconds   INR 1.0 0.8 - 1.2  Miscellaneous [facility] test (send-out)   Collection Time: 03/06/19  2:08 PM  Result Value Ref Range   [facility] test code [id number]    [facility] test name [id number]    Misc [facility] result COMMENT   Comprehensive  metabolic panel   Collection Time: 03/07/19  4:35 AM  Result Value Ref Range   Sodium 133 (L) 135 - 145 mmol/L   Potassium 2.5 (LL) 3.5 - 5.1 mmol/L   Chloride 102 98 - 111 mmol/L   CO2 21 (L) 22 - 32 mmol/L   Glucose, Bld 108 (H) 70 - 99 mg/dL   BUN <5 (L) 6 - 20 mg/dL   Creatinine, Ser 0.98 0.44 - 1.00 mg/dL   Calcium 8.7 (L) 8.9 - 10.3 mg/dL   Total Protein 6.0 (L) 6.5 - 8.1  g/dL   Albumin 2.8 (L) 3.5 - 5.0 g/dL   AST 119 (H) 15 - 41 U/L   ALT 520 (H) 0 - 44 U/L   Alkaline Phosphatase 84 38 - 126 U/L   Total Bilirubin 0.7 0.3 - 1.2 mg/dL   GFR calc non Af Amer >60 >60 mL/min   GFR calc Af Amer >60 >60 mL/min   Anion gap 10 5 - 15  Type and screen [facility]   Collection Time: 03/07/19  4:35 AM  Result Value Ref Range   ABO/RH(D) O POS    Antibody Screen NEG    Sample Expiration      03/10/2019 Performed at [facility], [address] [place], [place] [address]   Hepatitis B core antibody, IgM   Collection Time: 03/07/19  4:38 PM  Result Value Ref Range   Hep B C IgM Indeterminate Negative  IgG   Collection Time: 03/07/19  4:38 PM  Result Value Ref Range   IgG (Immunoglobin G), Serum 1,074 700 - 1,600 mg/dL  Ceruloplasmin   Collection Time: 03/07/19  4:38 PM  Result Value Ref Range   Ceruloplasmin 49.4 (H) 19.0 - 39.0 mg/dL  Comprehensive metabolic panel   Collection Time: 03/08/19  6:03 AM  Result Value Ref Range   Sodium 137 135 - 145 mmol/L   Potassium 4.3 3.5 - 5.1 mmol/L   Chloride 110 98 - 111 mmol/L   CO2 20 (L) 22 - 32 mmol/L   Glucose, Bld 91 70 - 99 mg/dL   BUN <5 (L) 6 - 20 mg/dL   Creatinine, Ser 9.56 0.44 - 1.00 mg/dL   Calcium 8.6 (L) 8.9 - 10.3 mg/dL   Total Protein 5.3 (L) 6.5 - 8.1 g/dL   Albumin 2.4 (L) 3.5 - 5.0 g/dL   AST 98 (H) 15 - 41 U/L   ALT 315 (H) 0 - 44 U/L   Alkaline Phosphatase 67 38 - 126 U/L   Total Bilirubin 0.4 0.3 - 1.2 mg/dL   GFR calc non Af Amer >60 >60 mL/min   GFR calc Af Amer >60 >60 mL/min   Anion gap 7 5 - 15  CBC   Collection Time: 03/08/19  6:03 AM  Result Value Ref Range   WBC 18.3 (H) 4.0 - 10.5 K/uL   RBC 3.02 (L) 3.87 - 5.11 MIL/uL   Hemoglobin 9.0 (L) 12.0 - 15.0 g/dL   HCT 21.3 (L) 08.6 - 57.8 %   MCV 90.1 80.0 - 100.0 fL   MCH 29.8 26.0 - 34.0 pg   MCHC 33.1 30.0 - 36.0 g/dL   RDW 46.9 62.9 - 52.8 %   Platelets 256 150 - 400 K/uL   nRBC 0.0 0.0 - 0.2  %  Basic metabolic panel   Collection Time: 03/08/19  8:04 PM  Result Value Ref Range   Sodium 136 135 - 145 mmol/L   Potassium  4.9 3.5 - 5.1 mmol/L   Chloride 113 (H) 98 - 111 mmol/L   CO2 22 22 - 32 mmol/L   Glucose, Bld 89 70 - 99 mg/dL   BUN <5 (L) 6 - 20 mg/dL   Creatinine, Ser 1.610.56 0.44 - 1.00 mg/dL   Calcium 8.7 (L) 8.9 - 10.3 mg/dL   GFR calc non Af Amer >60 >60 mL/min   GFR calc Af Amer >60 >60 mL/min   Anion gap 1 (L) 5 - 15   [place] Mfm Ob Detail + 14 Weeks  Result Date: 03/04/2019 ----------------------------------------------------------------------  OBSTETRICS REPORT                       (Signed Final 03/04/2019 09:56 am) ---------------------------------------------------------------------- Patient Info  ID #:       [id number]                          D.O.B.:  05-01-1991 (27 yrs)  Name:       Jean Mata                   Visit Date: 03/04/2019 08:05 am ---------------------------------------------------------------------- Performed By  Performed By:     Hurman HornAlyssa Keatts          Ref. Address:     [address]                    [place]                                                             [address]                                                             [place], [place]                                                             [id number]  Attending:        Noralee Spaceavi Shankar MD        [facility]  Referred By:      Gerrit HeckJESSICA EMLY                    CNM ---------------------------------------------------------------------- Orders   #  Description                          Code  Ordered By   1  [place] MFM OB DETAIL +14 WK              [id number]     JESSICA [facility]  ----------------------------------------------------------------------   #  Order #                    Accession #                 Episode #   1  [id number]                  [id number]                  [id number]   ---------------------------------------------------------------------- Indications   Encounter for antenatal screening for          Z36.3   malformations   Insufficient Prenatal Care (no prenatal care)  O09.30   Hypertension - Chronic/Pre-existing            O10.019   (labetalol)   [redacted] weeks gestation of pregnancy                Z3A.26  ---------------------------------------------------------------------- Fetal Evaluation  Num Of Fetuses:         1  Fetal Heart Rate(bpm):  150  Cardiac Activity:       Observed  Presentation:           Cephalic  Placenta:               Posterior  P. Cord Insertion:      Visualized  Amniotic Fluid  AFI FV:      Within normal limits                              Largest Pocket(cm)                              4.15 ---------------------------------------------------------------------- Biometry  BPD:      58.2  mm     G. Age:  23w 6d        < 1  %    CI:        80.42   %    70 - 86                                                          FL/HC:      21.8   %    18.6 - 20.4  HC:       205   mm     G. Age:  22w 4d        < 3  %    HC/AC:      1.03        1.04 - 1.22  AC:      198.8  mm     G. Age:  24w 4d          7  %    FL/BPD:     76.6   %    71 - 87  FL:       44.6  mm     G. Age:  24w 5d  7  %    FL/AC:      22.4   %    20 - 24  HUM:      39.8  mm     G. Age:  24w 2d        < 5  %  CER:      27.4  mm     G. Age:  24w 5d         23  %  Est. FW:     689  gm      1 lb 8 oz     20  % ---------------------------------------------------------------------- OB History  Gravidity:    1 ---------------------------------------------------------------------- Gestational Age  LMP:           20w 4d        Date:  10/11/18                 EDD:   07/18/19  U/S Today:     24w 0d                                        EDD:   06/24/19  Best:          26w 1d     Det. By:  Previous Ultrasound      EDD:   06/09/19                                      (10/30/18)  ---------------------------------------------------------------------- Anatomy  Cranium:               Appears normal         Aortic Arch:            Appears normal  Cavum:                 Appears normal         Ductal Arch:            Not well visualized  Ventricles:            Appears normal         Diaphragm:              Appears normal  Choroid Plexus:        Appears normal         Stomach:                Appears normal, left                                                                        sided  Cerebellum:            Appears normal         Abdomen:                Appears normal  Posterior Fossa:       Not well visualized    Abdominal Wall:         Appears nml (cord  insert, abd wall)  Nuchal Fold:           Not well visualized    Cord Vessels:           Appears normal (3                                                                        vessel cord)  Face:                  Orbits appear          Kidneys:                Appear normal                         normal  Lips:                  Appears normal         Bladder:                Appears normal  Thoracic:              Appears normal         Spine:                  Appears normal  Heart:                 Not well visualized    Upper Extremities:      Appears normal  RVOT:                  Not well visualized    Lower Extremities:      Appears normal  LVOT:                  Not well visualized  Other:  Gender not well visualized. Technically difficult due to maternal          habitus and fetal position. ---------------------------------------------------------------------- Cervix Uterus Adnexa  Cervix  Length:           3.16  cm.  Normal appearance by transabdominal scan.  Uterus  No abnormality visualized.  Left Ovary  Within normal limits.  Right Ovary  Within normal limits.  Adnexa  No abnormality visualized. ----------------------------------------------------------------------  Impression  Patient was admitted with c/o nausea and vomiting. Liver  enzymes are increased, but platelets are normal. Patient has  hypertension and is being evaluated for preeclampsia.  Fetal growth is appropriate for gestational age. Amniotic fluid  is normal and good fetal activity is seen. Fetal anatomy  appears normal. Cardiac anatomy and posterior fossa could  not be evaluated. ---------------------------------------------------------------------- Recommendations  -Follow-up ultrasound (limited) next week if she is still an  inpatient. ----------------------------------------------------------------------                  Noralee Space, MD Electronically Signed Final Report   03/04/2019 09:56 am ----------------------------------------------------------------------  US Abdomen Limited Ruq  Result Date: 03/03/2019 CLINICAL DATA:  Epigastric pain for 1 week. Twenty-four weeks pregnant. EXAM: ULTRASOUND ABDOMEN LIMITED RIGHT UPPER QUADRANT COMPARISON:  None. FINDINGS: Gallbladder: Small amount of dependent sludge. Tiny, 3 mm dependent stone. No wall thickening. No pericholecystic  fluid. Common bile duct: Diameter: 4 mm Liver: No focal lesion identified. Within normal limits in parenchymal echogenicity. Portal vein is patent on color Doppler imaging with normal direction of blood flow towards the liver. IMPRESSION: 1. No acute findings. 2. Tiny gallstone and small amount of dependent gallbladder sludge. No acute cholecystitis. No bile duct dilation. Electronically Signed   By: Amie Portland M.D.   On: 03/03/2019 22:34    No future appointments.  Discharge Condition: Stable  Discharge disposition: 01-Home or Self Care       Discharge Instructions    Discharge activity:  No Restrictions   Complete by:  As directed    Discharge diet:  No restrictions   Complete by:  As directed    Fetal Kick Count:  Lie on our left side for one hour after a meal, and count the number of times your baby kicks.   If it is less than 5 times, get up, move around and drink some juice.  Repeat the test 30 minutes later.  If it is still less than 5 kicks in an hour, notify your doctor.   Complete by:  As directed    No sexual activity restrictions   Complete by:  As directed      Allergies as of 03/09/2019      Reactions   Pollen Extract    Strawberry Extract Itching      Medication List    STOP taking these medications   cephALEXin 500 MG capsule Commonly known as:  KEFLEX     TAKE these medications   glycopyrrolate 0.2 MG/ML injection Commonly known as:  ROBINUL Inject 0.5 mLs (0.1 mg total) into the vein 3 (three) times daily as needed (excess salivation).   metoCLOPramide 10 MG tablet Commonly known as:  REGLAN Take 1 tablet (10 mg total) by mouth every 6 (six) hours as needed for nausea or vomiting.   pantoprazole 40 MG tablet Commonly known as:  PROTONIX Take 1 tablet (40 mg total) by mouth 2 (two) times daily.      Follow-up Information    [facility].   Specialty:  Obstetrics and Gynecology Why:  they will call you with an appointment Contact information: [address] [place] [address] [phone]          Signed: Reva Bores M.D. 03/09/2019, 1:14 PM

## 2019-03-09 NOTE — Discharge Instructions (Signed)
Hyperemesis Gravidarum  Hyperemesis gravidarum is a severe form of nausea and vomiting that happens during pregnancy. Hyperemesis is worse than morning sickness. It may cause you to have nausea or vomiting all day for many days. It may keep you from eating and drinking enough food and liquids, which can lead to dehydration, malnutrition, and weight loss. Hyperemesis usually occurs during the first half (the first 20 weeks) of pregnancy. It often goes away once a woman is in her second half of pregnancy. However, sometimes hyperemesis continues through an entire pregnancy.  What are the causes?  The cause of this condition is not known. It may be related to changes in chemicals (hormones) in the body during pregnancy, such as the high level of pregnancy hormone (human chorionic gonadotropin) or the increase in the female sex hormone (estrogen).  What are the signs or symptoms?  Symptoms of this condition include:  Nausea that does not go away.  Vomiting that does not allow you to keep any food down.  Weight loss.  Body fluid loss (dehydration).  Having no desire to eat, or not liking food that you have previously enjoyed.  How is this diagnosed?  This condition may be diagnosed based on:  A physical exam.  Your medical history.  Your symptoms.  Blood tests.  Urine tests.  How is this treated?  This condition is managed by controlling symptoms. This may include:  Following an eating plan. This can help lessen nausea and vomiting.  Taking prescription medicines.  An eating plan and medicines are often used together to help control symptoms. If medicines do not help relieve nausea and vomiting, you may need to receive fluids through an IV at the hospital.  Follow these instructions at home:  Eating and drinking    Avoid the following:  Drinking fluids with meals. Try not to drink anything during the 30 minutes before and after your meals.  Drinking more than 1 cup of fluid at a time.  Eating foods that trigger your  symptoms. These may include spicy foods, coffee, high-fat foods, very sweet foods, and acidic foods.  Skipping meals. Nausea can be more intense on an empty stomach. If you cannot tolerate food, do not force it. Try sucking on ice chips or other frozen items and make up for missed calories later.  Lying down within 2 hours after eating.  Being exposed to environmental triggers. These may include food smells, smoky rooms, closed spaces, rooms with strong smells, warm or humid places, overly loud and noisy rooms, and rooms with motion or flickering lights. Try eating meals in a well-ventilated area that is free of strong smells.  Quick and sudden changes in your movement.  Taking iron pills and multivitamins that contain iron. If you take prescription iron pills, do not stop taking them unless your health care provider approves.  Preparing food. The smell of food can spoil your appetite or trigger nausea.  To help relieve your symptoms:  Listen to your body. Everyone is different and has different preferences. Find what works best for you.  Eat and drink slowly.  Eat 5-6 small meals daily instead of 3 large meals. Eating small meals and snacks can help you avoid an empty stomach.  In the morning, before getting out of bed, eat a couple of crackers to avoid moving around on an empty stomach.  Try eating starchy foods as these are usually tolerated well. Examples include cereal, toast, bread, potatoes, pasta, rice, and pretzels.  Include at   least 1 serving of protein with your meals and snacks. Protein options include lean meats, poultry, seafood, beans, nuts, nut butters, eggs, cheese, and yogurt.  Try eating a protein-rich snack before bed. Examples of a protein-rick snack include cheese and crackers or a peanut butter sandwich made with 1 slice of whole-wheat bread and 1 tsp (5 g) of peanut butter.  Eat or suck on things that have ginger in them. It may help relieve nausea. Add  tsp ground ginger to hot tea or  choose ginger tea.  Try drinking 100% fruit juice or an electrolyte drink. An electrolyte drink contains sodium, potassium, and chloride.  Drink fluids that are cold, clear, and carbonated or sour. Examples include lemonade, ginger ale, lemon-lime soda, ice water, and sparkling water.  Brush your teeth or use a mouth rinse after meals.  Talk with your health care provider about starting a supplement of vitamin B6.  General instructions  Take over-the-counter and prescription medicines only as told by your health care provider.  Follow instructions from your health care provider about eating or drinking restrictions.  Continue to take your prenatal vitamins as told by your health care provider. If you are having trouble taking your prenatal vitamins, talk with your health care provider about different options.  Keep all follow-up and pre-birth (prenatal) visits as told by your health care provider. This is important.  Contact a health care provider if:  You have pain in your abdomen.  You have a severe headache.  You have vision problems.  You are losing weight.  You feel weak or dizzy.  Get help right away if:  You cannot drink fluids without vomiting.  You vomit blood.  You have constant nausea and vomiting.  You are very weak.  You faint.  You have a fever and your symptoms suddenly get worse.  Summary  Hyperemesis gravidarum is a severe form of nausea and vomiting that happens during pregnancy.  Making some changes to your eating habits may help relieve nausea and vomiting.  This condition may be managed with medicine.  If medicines do not help relieve nausea and vomiting, you may need to receive fluids through an IV at the hospital.  This information is not intended to replace advice given to you by your health care provider. Make sure you discuss any questions you have with your health care provider.  Document Released: 12/04/2005 Document Revised: 12/24/2017 Document Reviewed: 08/02/2016  Elsevier Interactive  Patient Education  2019 [employer]

## 2019-03-10 ENCOUNTER — Telehealth: Payer: Self-pay | Admitting: Family Medicine

## 2019-03-10 NOTE — Telephone Encounter (Signed)
Attempted to call patient to get her scheduled for Needs New OB asap. No PNC and inpt until today, 26 wks. Will need 2 hour when scheduled in 1-2 wks. No answer. Left message to give the office a call to get scheduled.

## 2019-03-13 LAB — MISC LABCORP TEST (SEND OUT): Labcorp test code: 9985

## 2019-03-22 LAB — HCV RNA QUANT: HCV Quantitative: NOT DETECTED IU/mL (ref >50)

## 2019-04-06 ENCOUNTER — Encounter (HOSPITAL_COMMUNITY): Payer: Self-pay

## 2019-04-06 ENCOUNTER — Other Ambulatory Visit: Payer: Self-pay

## 2019-04-06 ENCOUNTER — Inpatient Hospital Stay (HOSPITAL_COMMUNITY)
Admission: AD | Admit: 2019-04-06 | Discharge: 2019-04-18 | DRG: 806 | Disposition: A | Discharge status: Home / Self Care | Payer: BLUE CROSS/BLUE SHIELD | Attending: Obstetrics and Gynecology | Admitting: Obstetrics and Gynecology

## 2019-04-06 DIAGNOSIS — Z3A31 31 weeks gestation of pregnancy: Secondary | ICD-10-CM | POA: Diagnosis not present

## 2019-04-06 DIAGNOSIS — O99324 Drug use complicating childbirth: Secondary | ICD-10-CM | POA: Diagnosis present

## 2019-04-06 DIAGNOSIS — O9962 Diseases of the digestive system complicating childbirth: Secondary | ICD-10-CM | POA: Diagnosis present

## 2019-04-06 DIAGNOSIS — F1290 Cannabis use, unspecified, uncomplicated: Secondary | ICD-10-CM | POA: Diagnosis present

## 2019-04-06 DIAGNOSIS — O114 Pre-existing hypertension with pre-eclampsia, complicating childbirth: Secondary | ICD-10-CM | POA: Diagnosis present

## 2019-04-06 DIAGNOSIS — O1414 Severe pre-eclampsia complicating childbirth: Secondary | ICD-10-CM | POA: Diagnosis not present

## 2019-04-06 DIAGNOSIS — R1110 Vomiting, unspecified: Secondary | ICD-10-CM | POA: Diagnosis present

## 2019-04-06 DIAGNOSIS — E876 Hypokalemia: Secondary | ICD-10-CM | POA: Diagnosis present

## 2019-04-06 DIAGNOSIS — F319 Bipolar disorder, unspecified: Secondary | ICD-10-CM | POA: Diagnosis present

## 2019-04-06 DIAGNOSIS — O10013 Pre-existing essential hypertension complicating pregnancy, third trimester: Secondary | ICD-10-CM | POA: Diagnosis not present

## 2019-04-06 DIAGNOSIS — O0933 Supervision of pregnancy with insufficient antenatal care, third trimester: Secondary | ICD-10-CM | POA: Diagnosis not present

## 2019-04-06 DIAGNOSIS — Z3A30 30 weeks gestation of pregnancy: Secondary | ICD-10-CM

## 2019-04-06 DIAGNOSIS — O99344 Other mental disorders complicating childbirth: Secondary | ICD-10-CM | POA: Diagnosis present

## 2019-04-06 DIAGNOSIS — O113 Pre-existing hypertension with pre-eclampsia, third trimester: Secondary | ICD-10-CM | POA: Diagnosis not present

## 2019-04-06 DIAGNOSIS — Z87891 Personal history of nicotine dependence: Secondary | ICD-10-CM

## 2019-04-06 DIAGNOSIS — O212 Late vomiting of pregnancy: Secondary | ICD-10-CM | POA: Diagnosis present

## 2019-04-06 DIAGNOSIS — G4700 Insomnia, unspecified: Secondary | ICD-10-CM | POA: Diagnosis present

## 2019-04-06 DIAGNOSIS — O1002 Pre-existing essential hypertension complicating childbirth: Secondary | ICD-10-CM | POA: Diagnosis present

## 2019-04-06 DIAGNOSIS — O365930 Maternal care for other known or suspected poor fetal growth, third trimester, not applicable or unspecified: Secondary | ICD-10-CM | POA: Diagnosis not present

## 2019-04-06 DIAGNOSIS — O0930 Supervision of pregnancy with insufficient antenatal care, unspecified trimester: Secondary | ICD-10-CM

## 2019-04-06 DIAGNOSIS — F3131 Bipolar disorder, current episode depressed, mild: Secondary | ICD-10-CM | POA: Diagnosis present

## 2019-04-06 DIAGNOSIS — R945 Abnormal results of liver function studies: Secondary | ICD-10-CM

## 2019-04-06 DIAGNOSIS — O99284 Endocrine, nutritional and metabolic diseases complicating childbirth: Secondary | ICD-10-CM | POA: Diagnosis present

## 2019-04-06 DIAGNOSIS — O1493 Unspecified pre-eclampsia, third trimester: Secondary | ICD-10-CM | POA: Diagnosis not present

## 2019-04-06 DIAGNOSIS — Z362 Encounter for other antenatal screening follow-up: Secondary | ICD-10-CM | POA: Diagnosis not present

## 2019-04-06 DIAGNOSIS — O99354 Diseases of the nervous system complicating childbirth: Secondary | ICD-10-CM | POA: Diagnosis present

## 2019-04-06 DIAGNOSIS — O1490 Unspecified pre-eclampsia, unspecified trimester: Secondary | ICD-10-CM

## 2019-04-06 DIAGNOSIS — Z915 Personal history of self-harm: Secondary | ICD-10-CM | POA: Diagnosis not present

## 2019-04-06 DIAGNOSIS — K529 Noninfective gastroenteritis and colitis, unspecified: Secondary | ICD-10-CM | POA: Diagnosis present

## 2019-04-06 DIAGNOSIS — R740 Nonspecific elevation of levels of transaminase and lactic acid dehydrogenase [LDH]: Secondary | ICD-10-CM | POA: Diagnosis present

## 2019-04-06 DIAGNOSIS — R7989 Other specified abnormal findings of blood chemistry: Secondary | ICD-10-CM | POA: Diagnosis not present

## 2019-04-06 DIAGNOSIS — O289 Unspecified abnormal findings on antenatal screening of mother: Secondary | ICD-10-CM | POA: Diagnosis not present

## 2019-04-06 DIAGNOSIS — O119 Pre-existing hypertension with pre-eclampsia, unspecified trimester: Secondary | ICD-10-CM | POA: Diagnosis present

## 2019-04-06 DIAGNOSIS — Z3A32 32 weeks gestation of pregnancy: Secondary | ICD-10-CM | POA: Diagnosis not present

## 2019-04-06 HISTORY — DX: Other specified abnormal findings of blood chemistry: R79.89

## 2019-04-06 HISTORY — DX: Abnormal results of liver function studies: R94.5

## 2019-04-06 LAB — URINALYSIS, ROUTINE W REFLEX MICROSCOPIC
Bilirubin Urine: NEGATIVE
Glucose, UA: 50 mg/dL — AB
Ketones, ur: 80 mg/dL — AB
Nitrite: NEGATIVE
Protein, ur: 30 mg/dL — AB
Specific Gravity, Urine: 1.021 (ref 1.005–1.030)
pH: 6 (ref 5.0–8.0)

## 2019-04-06 LAB — COMPREHENSIVE METABOLIC PANEL
ALT: 15 U/L (ref 0–44)
AST: 32 U/L (ref 15–41)
Albumin: 3.2 g/dL — ABNORMAL LOW (ref 3.5–5.0)
Alkaline Phosphatase: 81 U/L (ref 38–126)
Anion gap: 13 (ref 5–15)
BUN: 6 mg/dL (ref 6–20)
CO2: 16 mmol/L — ABNORMAL LOW (ref 22–32)
Calcium: 9.1 mg/dL (ref 8.9–10.3)
Chloride: 104 mmol/L (ref 98–111)
Creatinine, Ser: 0.49 mg/dL (ref 0.44–1.00)
GFR calc Af Amer: >60 mL/min (ref >60)
GFR calc non Af Amer: >60 mL/min (ref >60)
Glucose, Bld: 101 mg/dL — ABNORMAL HIGH (ref 70–99)
Potassium: 4.2 mmol/L (ref 3.5–5.1)
Sodium: 133 mmol/L — ABNORMAL LOW (ref 135–145)
Total Bilirubin: 1.3 mg/dL — ABNORMAL HIGH (ref 0.3–1.2)
Total Protein: 6.9 g/dL (ref 6.5–8.1)

## 2019-04-06 LAB — CBC WITH DIFFERENTIAL/PLATELET
Abs Immature Granulocytes: 0.21 10*3/uL — ABNORMAL HIGH (ref 0.00–0.07)
Basophils Absolute: 0.1 10*3/uL (ref 0.0–0.1)
Basophils Relative: 0 %
Eosinophils Absolute: 0.1 10*3/uL (ref 0.0–0.5)
Eosinophils Relative: 0 %
HCT: 34.7 % — ABNORMAL LOW (ref 36.0–46.0)
Hemoglobin: 11.3 g/dL — ABNORMAL LOW (ref 12.0–15.0)
Immature Granulocytes: 1 %
Lymphocytes Relative: 9 %
Lymphs Abs: 2.1 10*3/uL (ref 0.7–4.0)
MCH: 29.6 pg (ref 26.0–34.0)
MCHC: 32.6 g/dL (ref 30.0–36.0)
MCV: 90.8 fL (ref 80.0–100.0)
Monocytes Absolute: 0.8 10*3/uL (ref 0.1–1.0)
Monocytes Relative: 3 %
Neutro Abs: 21.7 10*3/uL — ABNORMAL HIGH (ref 1.7–7.7)
Neutrophils Relative %: 87 %
Platelets: 425 10*3/uL — ABNORMAL HIGH (ref 150–400)
RBC: 3.82 MIL/uL — ABNORMAL LOW (ref 3.87–5.11)
RDW: 12.9 % (ref 11.5–15.5)
WBC: 24.8 10*3/uL — ABNORMAL HIGH (ref 4.0–10.5)
nRBC: 0 % (ref 0.0–0.2)

## 2019-04-06 LAB — RAPID URINE DRUG SCREEN, HOSP PERFORMED
Amphetamines: NOT DETECTED
Barbiturates: NOT DETECTED
Benzodiazepines: NOT DETECTED
Cocaine: NOT DETECTED
Opiates: NOT DETECTED
Tetrahydrocannabinol: POSITIVE — AB

## 2019-04-06 LAB — PROTEIN / CREATININE RATIO, URINE
Creatinine, Urine: 157.3 mg/dL
Protein Creatinine Ratio: 0.16 mg/mg{Cre} — ABNORMAL HIGH (ref 0.00–0.15)
Total Protein, Urine: 25 mg/dL

## 2019-04-06 MED ORDER — ONDANSETRON HCL 4 MG/2ML IJ SOLN
4.0000 mg | Freq: Four times a day (QID) | INTRAMUSCULAR | Status: DC | PRN
  Administered 2019-04-07 – 2019-04-13 (×7): 4 mg via INTRAVENOUS
  Filled 2019-04-06 (×9): qty 2

## 2019-04-06 MED ORDER — LACTATED RINGERS IV BOLUS
1000.0000 mL | Freq: Once | INTRAVENOUS | Status: AC
  Administered 2019-04-06: 1000 mL via INTRAVENOUS

## 2019-04-06 MED ORDER — FAMOTIDINE IN NACL 20-0.9 MG/50ML-% IV SOLN
20.0000 mg | Freq: Once | INTRAVENOUS | Status: AC
  Administered 2019-04-06: 20 mg via INTRAVENOUS
  Filled 2019-04-06 (×2): qty 50

## 2019-04-06 MED ORDER — MAGNESIUM SULFATE 40 G IN LACTATED RINGERS - SIMPLE
INTRAVENOUS | Status: AC
  Filled 2019-04-06: qty 500

## 2019-04-06 MED ORDER — LABETALOL HCL 5 MG/ML IV SOLN
20.0000 mg | INTRAVENOUS | Status: DC | PRN
  Administered 2019-04-06: 20 mg via INTRAVENOUS

## 2019-04-06 MED ORDER — LACTATED RINGERS IV SOLN
INTRAVENOUS | Status: AC
  Administered 2019-04-06 – 2019-04-07 (×3): via INTRAVENOUS

## 2019-04-06 MED ORDER — HYDRALAZINE HCL 20 MG/ML IJ SOLN: 10.0000 mg | INTRAMUSCULAR | Status: DC | PRN

## 2019-04-06 MED ORDER — PROMETHAZINE HCL 25 MG/ML IJ SOLN
25.0000 mg | Freq: Four times a day (QID) | INTRAMUSCULAR | Status: DC | PRN
  Administered 2019-04-09 – 2019-04-11 (×3): 25 mg via INTRAVENOUS
  Filled 2019-04-06 (×3): qty 1

## 2019-04-06 MED ORDER — PANTOPRAZOLE SODIUM 40 MG PO TBEC
40.0000 mg | DELAYED_RELEASE_TABLET | Freq: Two times a day (BID) | ORAL | Status: DC
  Administered 2019-04-06 – 2019-04-15 (×18): 40 mg via ORAL
  Filled 2019-04-06 (×18): qty 1

## 2019-04-06 MED ORDER — LABETALOL HCL 5 MG/ML IV SOLN: 40.0000 mg | INTRAVENOUS | Status: DC | PRN

## 2019-04-06 MED ORDER — LABETALOL HCL 5 MG/ML IV SOLN: 80.0000 mg | INTRAVENOUS | Status: DC | PRN

## 2019-04-06 MED ORDER — LABETALOL HCL 5 MG/ML IV SOLN: 20.0000 mg | INTRAVENOUS | Status: DC | PRN

## 2019-04-06 MED ORDER — ACETAMINOPHEN 325 MG PO TABS
650.0000 mg | ORAL_TABLET | ORAL | Status: DC | PRN
  Administered 2019-04-11: 17:00:00 650 mg via ORAL
  Filled 2019-04-06: qty 2

## 2019-04-06 MED ORDER — SODIUM CHLORIDE 0.9 % IV SOLN
8.0000 mg | Freq: Once | INTRAVENOUS | Status: AC
  Administered 2019-04-06: 8 mg via INTRAVENOUS
  Filled 2019-04-06: qty 4

## 2019-04-06 MED ORDER — LACTATED RINGERS IV SOLN
INTRAVENOUS | Status: DC
  Administered 2019-04-06: 21:00:00 via INTRAVENOUS

## 2019-04-06 MED ORDER — PRENATAL MULTIVITAMIN CH
1.0000 | ORAL_TABLET | Freq: Every day | ORAL | Status: DC
  Administered 2019-04-07 – 2019-04-13 (×5): 1 via ORAL
  Filled 2019-04-06 (×7): qty 1

## 2019-04-06 MED ORDER — METOCLOPRAMIDE HCL 5 MG/ML IJ SOLN
10.0000 mg | Freq: Four times a day (QID) | INTRAMUSCULAR | Status: DC | PRN
  Administered 2019-04-07 – 2019-04-10 (×3): 10 mg via INTRAVENOUS
  Filled 2019-04-06 (×3): qty 2

## 2019-04-06 MED ORDER — FAMOTIDINE 20 MG IN NS 100 ML IVPB
20.0000 mg | Freq: Once | INTRAVENOUS | Status: DC
  Filled 2019-04-06: qty 100

## 2019-04-06 MED ORDER — MAGNESIUM SULFATE BOLUS VIA INFUSION
4.0000 g | Freq: Once | INTRAVENOUS | Status: AC
  Administered 2019-04-06: 4 g via INTRAVENOUS
  Filled 2019-04-06: qty 500

## 2019-04-06 MED ORDER — BETAMETHASONE SOD PHOS & ACET 6 (3-3) MG/ML IJ SUSP
12.0000 mg | INTRAMUSCULAR | Status: AC
  Administered 2019-04-06 – 2019-04-07 (×2): 12 mg via INTRAMUSCULAR
  Filled 2019-04-06 (×2): qty 2

## 2019-04-06 MED ORDER — DOCUSATE SODIUM 100 MG PO CAPS
100.0000 mg | ORAL_CAPSULE | Freq: Every day | ORAL | Status: DC
  Administered 2019-04-06 – 2019-04-14 (×8): 100 mg via ORAL
  Filled 2019-04-06 (×10): qty 1

## 2019-04-06 MED ORDER — CALCIUM CARBONATE ANTACID 500 MG PO CHEW
2.0000 | CHEWABLE_TABLET | ORAL | Status: DC | PRN
  Administered 2019-04-07 – 2019-04-10 (×2): 400 mg via ORAL
  Filled 2019-04-06 (×2): qty 2

## 2019-04-06 MED ORDER — ZOLPIDEM TARTRATE 5 MG PO TABS
5.0000 mg | ORAL_TABLET | Freq: Every evening | ORAL | Status: DC | PRN
  Administered 2019-04-06 – 2019-04-08 (×3): 5 mg via ORAL
  Filled 2019-04-06 (×3): qty 1

## 2019-04-06 MED ORDER — LABETALOL HCL 5 MG/ML IV SOLN
INTRAVENOUS | Status: AC
  Administered 2019-04-06: 20 mg via INTRAVENOUS
  Filled 2019-04-06: qty 4

## 2019-04-06 MED ORDER — MAGNESIUM SULFATE 40 G IN LACTATED RINGERS - SIMPLE
2.0000 g/h | INTRAVENOUS | Status: AC
  Administered 2019-04-06 – 2019-04-07 (×2): 2 g/h via INTRAVENOUS
  Filled 2019-04-06: qty 500

## 2019-04-06 NOTE — MAU Note (Signed)
Jean Mata is a 28 y.o. at [redacted]w[redacted]d here in MAU reporting: Pt arrived via EMS nausea, vomiting, and acid reflux for 2 weeks. States she was Rx Reglan but stopped taking it since the acid reflux got worse and it wasn't as much of a nausea problem. Reports + FM, no vaginal bleeding, no LOF. Reports abdominal pain since this AM. Also reports 4 rounds of watery diarrhea today.  Onset of complaint: ongoing for a couple weeks  Pain score: 5/10  Vitals:   04/06/19 1813  BP: (!) 161/93  Pulse: 97  Resp: 18  Temp: 98 F (36.7 C)  SpO2: 100%      Lab orders placed from triage: UA

## 2019-04-06 NOTE — H&P (Signed)
History     CSN: [id number]  Arrival date and time: 04/06/19 1756   Chief Complaint  Patient presents with  . Emesis  . Abdominal Pain  . Diarrhea   [name] is a 28 y.o. G1P0 at [redacted]w[redacted]d who presents to MAU for N/V beginning around 2000 last night and has been unable to eat or drink anything since that time. Pt reports 6 episodes of vomiting since that time and was actively vomiting a brown-colored vomitus while talking with provider. Pt was on her way to the hospital when she reports she started vomiting blood and called an ambulance from the side of the road. Pt confirms no bright red colored vomitus. Pt reports the blood she saw in her vomit in the car was also brown in color as in MAU. Pt denies sick contacts and reports the last thing she ate was a "seafood plate" from take out that no one else ate. Pt also reports 4+ episodes of watery diarrhea without any formed stool components beginning "last night or this morning". Pt reports stool is "green with leaves" in appearance.  Pt also reports marijuana use and reported to the nurse that she last used at 1700 today. Pt reports her N/V improves when she takes a hot shower and has taken 3+ showers in the last 2days.  Of note, pt has not yet been seen for regular prenatal care, but was admitted to the hospital in March, given BMZ and dx w/ cHTN with super-imposed preeclampsia. Pt reports scotomata that is recurring and not new in onset. Pt reports mild epigastric pain starting a few days ago.   Pt denies HA, swelling in face and hands, sudden weight gain. Pt denies chest pain and SOB.  Pt denies constipation, or urinary problems. Pt denies fever. Pt denies weakness.  Pt denies VB, ctx, LOF and reports good FM.  Current pregnancy problems? cHTN with super-imposed preeclampsia Blood Type? O Positive Allergies? seasonal and strawberry, NKDA Current medications? none Current PNC & next appt? none, no appt scheduled   OB  History    Gravida  1   Para      Term      Preterm      AB      Living        SAB      TAB      Ectopic      Multiple      Live Births              Past Medical History:  Diagnosis Date  . Asthma   . Bipolar disorder (HCC)   . Depression   . Hydrocephalus (HCC)   . Obesity   . Trichimoniasis     Past Surgical History:  Procedure Laterality Date  . BRAIN SURGERY      Had hydrocephalus and fluid was removed in 2009 (no shunt).     History reviewed. No pertinent family history.  Social History   Tobacco Use  . Smoking status: Former Games developer  . Smokeless tobacco: Never Used  Substance Use Topics  . Alcohol use: Not Currently    Comment: last drink 09/2018  . Drug use: Yes    Types: Marijuana    Comment: last use was 1700 on 04/06/19    Allergies:  Allergies  Allergen Reactions  . Pollen Extract   . Strawberry Extract Itching    Medications Prior to Admission  Medication Sig Dispense Refill Last Dose  . glycopyrrolate (ROBINUL-FORTE) 2  MG tablet Take 1 tablet (2 mg total) by mouth 3 (three) times daily. 90 tablet 1   . metoCLOPramide (REGLAN) 10 MG tablet Take 1 tablet (10 mg total) by mouth every 6 (six) hours as needed for nausea or vomiting. 12 tablet 0 03/03/2019 at Unknown time  . pantoprazole (PROTONIX) 40 MG tablet Take 1 tablet (40 mg total) by mouth 2 (two) times daily. 30 tablet 1     Review of Systems  Constitutional: Positive for appetite change, chills, diaphoresis and fatigue. Negative for fever.  Respiratory: Negative for shortness of breath.   Cardiovascular: Negative for chest pain.  Gastrointestinal: Positive for abdominal pain (epigastric), diarrhea, nausea and vomiting. Negative for constipation.  Genitourinary: Negative for dysuria, flank pain, frequency, pelvic pain, urgency, vaginal bleeding and vaginal discharge.  Neurological: Positive for light-headedness. Negative for dizziness, weakness and headaches.   Physical  Exam   Blood pressure (!) 147/85, pulse 88, temperature 98 F (36.7 C), temperature source Oral, resp. rate 18, last menstrual period 10/11/2018, SpO2 100 %.   Patient Vitals for the past 24 hrs:  BP Temp Temp src Pulse Resp SpO2  04/06/19 2008 (!) 147/85 - - 88 - 100 %  04/06/19 1940 (!) 155/87 - - 88 - -  04/06/19 1915 (!) 156/85 - - 87 - -  04/06/19 1900 (!) 153/87 - - 91 - -  04/06/19 1842 (!) 162/93 - - 94 - -  04/06/19 1813 (!) 161/93 98 F (36.7 C) Oral 97 18 100 %   Physical Exam  Constitutional: She is oriented to person, place, and time. She appears well-developed and well-nourished. She appears distressed.  HENT:  Head: Normocephalic and atraumatic.  Respiratory: Effort normal.  Neurological: She is alert and oriented to person, place, and time.  +clonus  Skin: Skin is warm and dry. She is not diaphoretic.  Psychiatric: She has a normal mood and affect. Her behavior is normal.   Results for orders placed or performed during the hospital encounter of 04/06/19 (from the past 24 hour(s))  Urinalysis, Routine w reflex microscopic     Status: Abnormal   Collection Time: 04/06/19  6:15 PM  Result Value Ref Range   Color, Urine YELLOW YELLOW   APPearance HAZY (A) CLEAR   Specific Gravity, Urine 1.021 1.005 - 1.030   pH 6.0 5.0 - 8.0   Glucose, UA 50 (A) NEGATIVE mg/dL   Hgb urine dipstick SMALL (A) NEGATIVE   Bilirubin Urine NEGATIVE NEGATIVE   Ketones, ur 80 (A) NEGATIVE mg/dL   Protein, ur 30 (A) NEGATIVE mg/dL   Nitrite NEGATIVE NEGATIVE   Leukocytes,Ua SMALL (A) NEGATIVE   RBC / HPF 0-5 0 - 5 RBC/hpf   WBC, UA 11-20 0 - 5 WBC/hpf   Bacteria, UA RARE (A) NONE SEEN   Squamous Epithelial / LPF 0-5 0 - 5   Mucus PRESENT   Protein / creatinine ratio, urine     Status: Abnormal   Collection Time: 04/06/19  6:15 PM  Result Value Ref Range   Creatinine, Urine 157.30 mg/dL   Total Protein, Urine 25 mg/dL   Protein Creatinine Ratio 0.16 (H) 0.00 - 0.15 mg/mg[Cre]   Urine rapid drug screen (hosp performed)     Status: Abnormal   Collection Time: 04/06/19  6:15 PM  Result Value Ref Range   Opiates NONE DETECTED NONE DETECTED   Cocaine NONE DETECTED NONE DETECTED   Benzodiazepines NONE DETECTED NONE DETECTED   Amphetamines NONE DETECTED NONE DETECTED  Tetrahydrocannabinol POSITIVE (A) NONE DETECTED   Barbiturates NONE DETECTED NONE DETECTED  CBC with Differential/Platelet     Status: Abnormal   Collection Time: 04/06/19  6:37 PM  Result Value Ref Range   WBC 24.8 (H) 4.0 - 10.5 K/uL   RBC 3.82 (L) 3.87 - 5.11 MIL/uL   Hemoglobin 11.3 (L) 12.0 - 15.0 g/dL   HCT 40.934.7 (L) 81.136.0 - 91.446.0 %   MCV 90.8 80.0 - 100.0 fL   MCH 29.6 26.0 - 34.0 pg   MCHC 32.6 30.0 - 36.0 g/dL   RDW 78.212.9 95.611.5 - 21.315.5 %   Platelets 425 (H) 150 - 400 K/uL   nRBC 0.0 0.0 - 0.2 %   Neutrophils Relative % 87 %   Neutro Abs 21.7 (H) 1.7 - 7.7 K/uL   Lymphocytes Relative 9 %   Lymphs Abs 2.1 0.7 - 4.0 K/uL   Monocytes Relative 3 %   Monocytes Absolute 0.8 0.1 - 1.0 K/uL   Eosinophils Relative 0 %   Eosinophils Absolute 0.1 0.0 - 0.5 K/uL   Basophils Relative 0 %   Basophils Absolute 0.1 0.0 - 0.1 K/uL   Immature Granulocytes 1 %   Abs Immature Granulocytes 0.21 (H) 0.00 - 0.07 K/uL  Comprehensive metabolic panel     Status: Abnormal   Collection Time: 04/06/19  6:37 PM  Result Value Ref Range   Sodium 133 (L) 135 - 145 mmol/L   Potassium 4.2 3.5 - 5.1 mmol/L   Chloride 104 98 - 111 mmol/L   CO2 16 (L) 22 - 32 mmol/L   Glucose, Bld 101 (H) 70 - 99 mg/dL   BUN 6 6 - 20 mg/dL   Creatinine, Ser 0.860.49 0.44 - 1.00 mg/dL   Calcium 9.1 8.9 - 57.810.3 mg/dL   Total Protein 6.9 6.5 - 8.1 g/dL   Albumin 3.2 (L) 3.5 - 5.0 g/dL   AST 32 15 - 41 U/L   ALT 15 0 - 44 U/L   Alkaline Phosphatase 81 38 - 126 U/L   Total Bilirubin 1.3 (H) 0.3 - 1.2 mg/dL   GFR calc non Af Amer >60 >60 mL/min   GFR calc Af Amer >60 >60 mL/min   Anion gap 13 5 - 15    No results found.  MAU Course   Procedures  MDM -N/V/diarrhea - cannabis induced hyperemesis vs. food poisoning       -UA: hazy/50GLU/sm.HGB/80KET/30PRO/sm. leuks/rare bacteria, sending for culture       -1L LR with Zofran 8mg /Pepcid 20mg  given       -after fluids/meds, pt reports no episodes of vomiting, but reports continued nausea and          states "I still feel like trash"       -UDS: +THC -cHTN with superimposed preeclampsia and severe range pressures on admission to MAU       -PreE order set entered, labetalol 20mg  given, next pressure 153/87       -+clonus on exam       -CBC: WBCs 24.8, H/H 11.3/34.7, platelets 425       -CMP: AST/ALT 32/15 (improved from 67mo ago)       -PCr: 0.16 (was 0.23 67mo ago) -BMZ 03/17 & 03/05/2019 -EFM: reactive with variables       -baseline: 130       -variability: moderate       -accels: present, 15x15       -decels: few variable, one possibly elongated variable for 2.335min around  18:52       -TOCO: no ctx -spoke with Dr. Macon Large @2010 , will admit to ante for observation and start on magnesium; Dr. Macon Large to enter orders for admission. Care transferred to Dr. Macon Large.  Marce Schartz, Odie Sera, NP  8:26 PM 04/06/2019  Orders Placed This Encounter  Procedures  . Culture, OB Urine    Standing Status:   Standing    Number of Occurrences:   1  . Urinalysis, Routine w reflex microscopic    Standing Status:   Standing    Number of Occurrences:   1  . Protein / creatinine ratio, urine    Standing Status:   Standing    Number of Occurrences:   1  . CBC with Differential/Platelet    Standing Status:   Standing    Number of Occurrences:   1  . Comprehensive metabolic panel    Standing Status:   Standing    Number of Occurrences:   1  . Urine rapid drug screen (hosp performed)    Standing Status:   Standing    Number of Occurrences:   1  . Notify Physician    Confirmatory reading of BP> 160/110 15 minutes later    Standing Status:   Standing    Number of Occurrences:   1     Order Specific Question:   Notify Physician    Answer:   Temp greater than or equal to 100.4    Order Specific Question:   Notify Physician    Answer:   RR greater than 24 or less than 10    Order Specific Question:   Notify Physician    Answer:   HR greater than 120 or less than 50    Order Specific Question:   Notify Physician    Answer:   SBP greater than 160 mmHG or less than 80 mmHG    Order Specific Question:   Notify Physician    Answer:   DBP greater than 110 mmHG or less than 45 mmHG    Order Specific Question:   Notify Physician    Answer:   Urinary output is less than for any 4 hour period  . Vital signs    Standing Status:   Standing    Number of Occurrences:   1  . Fetal monitoring    Standing Status:   Standing    Number of Occurrences:   1  . Measure blood pressure    20 minutes after giving hydralazine 10 MG IV dose.  Call MD if SBP >/= 160 or DBP >/= 110.    Standing Status:   Standing    Number of Occurrences:   1  . Contact Isolation: Enteric    Standing Status:   Standing    Number of Occurrences:   1   Meds ordered this encounter  Medications  . lactated ringers infusion  . lactated ringers bolus 1,000 mL  . AND Linked Order Group   . labetalol (NORMODYNE) injection 20 mg   . labetalol (NORMODYNE) injection 40 mg   . labetalol (NORMODYNE) injection 80 mg   . hydrALAZINE (APRESOLINE) injection 10 mg  . ondansetron (ZOFRAN) 8 mg in sodium chloride 0.9 % 50 mL IVPB  . DISCONTD: famotidine (PEPCID) IVPB 20 mg in NS 100 mL IVPB  . labetalol (NORMODYNE) 5 MG/ML injection    Randa Evens, Ch: cabinet override  . famotidine (PEPCID) IVPB 20 mg premix   Assessment and Plan   -admit  to antepartum for observation -care transferred to Dr. London Sheer E Emiliana Blaize 04/06/2019, 8:25 PM

## 2019-04-06 NOTE — MAU Note (Signed)
Pt slow to respond to any question asked. Asked how she felt, she slowly responded fine.  When asked about the nausea she said she still has it. Asked if she was needing to use the bathroom and got no response. When asked why she isnt responding, she did not respond. Asked if she is always like this, slow to respond, and choosing not to respond . She eventually said, Im tired.

## 2019-04-07 ENCOUNTER — Encounter (HOSPITAL_COMMUNITY): Payer: Self-pay | Admitting: *Deleted

## 2019-04-07 ENCOUNTER — Inpatient Hospital Stay (HOSPITAL_COMMUNITY): Payer: BLUE CROSS/BLUE SHIELD

## 2019-04-07 DIAGNOSIS — O289 Unspecified abnormal findings on antenatal screening of mother: Secondary | ICD-10-CM

## 2019-04-07 DIAGNOSIS — O10013 Pre-existing essential hypertension complicating pregnancy, third trimester: Secondary | ICD-10-CM

## 2019-04-07 DIAGNOSIS — O0933 Supervision of pregnancy with insufficient antenatal care, third trimester: Secondary | ICD-10-CM

## 2019-04-07 DIAGNOSIS — Z3A31 31 weeks gestation of pregnancy: Secondary | ICD-10-CM

## 2019-04-07 DIAGNOSIS — Z362 Encounter for other antenatal screening follow-up: Secondary | ICD-10-CM

## 2019-04-07 DIAGNOSIS — O365930 Maternal care for other known or suspected poor fetal growth, third trimester, not applicable or unspecified: Secondary | ICD-10-CM

## 2019-04-07 LAB — TYPE AND SCREEN
ABO/RH(D): O POS
Antibody Screen: NEGATIVE

## 2019-04-07 LAB — COMPREHENSIVE METABOLIC PANEL
ALT: 9 U/L (ref 0–44)
AST: 14 U/L — ABNORMAL LOW (ref 15–41)
Albumin: 2.7 g/dL — ABNORMAL LOW (ref 3.5–5.0)
Alkaline Phosphatase: 73 U/L (ref 38–126)
Anion gap: 11 (ref 5–15)
BUN: <5 mg/dL — ABNORMAL LOW (ref 6–20)
CO2: 19 mmol/L — ABNORMAL LOW (ref 22–32)
Calcium: 8 mg/dL — ABNORMAL LOW (ref 8.9–10.3)
Chloride: 103 mmol/L (ref 98–111)
Creatinine, Ser: 0.46 mg/dL (ref 0.44–1.00)
GFR calc Af Amer: >60 mL/min (ref >60)
GFR calc non Af Amer: >60 mL/min (ref >60)
Glucose, Bld: 130 mg/dL — ABNORMAL HIGH (ref 70–99)
Potassium: 3.2 mmol/L — ABNORMAL LOW (ref 3.5–5.1)
Sodium: 133 mmol/L — ABNORMAL LOW (ref 135–145)
Total Bilirubin: 0.5 mg/dL (ref 0.3–1.2)
Total Protein: 6.3 g/dL — ABNORMAL LOW (ref 6.5–8.1)

## 2019-04-07 LAB — CBC WITH DIFFERENTIAL/PLATELET
Abs Immature Granulocytes: 0 10*3/uL (ref 0.00–0.07)
Basophils Absolute: 0 10*3/uL (ref 0.0–0.1)
Basophils Relative: 0 %
Eosinophils Absolute: 0 10*3/uL (ref 0.0–0.5)
Eosinophils Relative: 0 %
HCT: 29.6 % — ABNORMAL LOW (ref 36.0–46.0)
Hemoglobin: 9.9 g/dL — ABNORMAL LOW (ref 12.0–15.0)
Lymphocytes Relative: 5 %
Lymphs Abs: 1.3 10*3/uL (ref 0.7–4.0)
MCH: 30.7 pg (ref 26.0–34.0)
MCHC: 33.4 g/dL (ref 30.0–36.0)
MCV: 91.9 fL (ref 80.0–100.0)
Monocytes Absolute: 0.3 10*3/uL (ref 0.1–1.0)
Monocytes Relative: 1 %
Neutro Abs: 23.9 10*3/uL — ABNORMAL HIGH (ref 1.7–7.7)
Neutrophils Relative %: 94 %
Platelets: 369 10*3/uL (ref 150–400)
RBC: 3.22 MIL/uL — ABNORMAL LOW (ref 3.87–5.11)
RDW: 12.8 % (ref 11.5–15.5)
WBC: 25.4 10*3/uL — ABNORMAL HIGH (ref 4.0–10.5)
nRBC: 0 % (ref 0.0–0.2)
nRBC: 0 /100 WBC

## 2019-04-07 LAB — HEMOGLOBIN A1C
Hgb A1c MFr Bld: 5.1 % (ref 4.8–5.6)
Mean Plasma Glucose: 99.67 mg/dL

## 2019-04-07 LAB — HIV ANTIBODY (ROUTINE TESTING W REFLEX): HIV Screen 4th Generation wRfx: NONREACTIVE

## 2019-04-07 LAB — RPR: RPR Ser Ql: NONREACTIVE

## 2019-04-07 LAB — MAGNESIUM: Magnesium: 3.9 mg/dL — ABNORMAL HIGH (ref 1.7–2.4)

## 2019-04-07 MED ORDER — LABETALOL HCL 100 MG PO TABS
100.0000 mg | ORAL_TABLET | Freq: Two times a day (BID) | ORAL | Status: DC
  Administered 2019-04-07 – 2019-04-10 (×8): 100 mg via ORAL
  Filled 2019-04-07 (×8): qty 1

## 2019-04-07 MED ORDER — POTASSIUM CHLORIDE CRYS ER 20 MEQ PO TBCR
40.0000 meq | EXTENDED_RELEASE_TABLET | Freq: Two times a day (BID) | ORAL | Status: AC
  Administered 2019-04-07 – 2019-04-08 (×4): 40 meq via ORAL
  Filled 2019-04-07 (×4): qty 2

## 2019-04-07 MED ORDER — POTASSIUM CHLORIDE 10 MEQ/100ML IV SOLN
10.0000 meq | INTRAVENOUS | Status: DC
  Filled 2019-04-07 (×4): qty 100

## 2019-04-07 NOTE — Consult Note (Signed)
Maternal-Fetal Medicine (Tele-Video-Consultation)  Name: Kyleena Daza MRN: [id number] Requesting Provider: Jaynie Collins, MD  Ms. Yesennia, [place] P0 at 31-weeks' gestation, was admitted yesterday after evaluation at the MAU for c/o nausea and vomiting. Severe range blood pressures were noted and patient required IV antihypertensives (labetalol) for control. She does not have symptoms of severe headache or visual disturbances or right upper quadrant pain or vaginal bleeding now.  Patient had severe range blood pressures (SBP>160 mm Hg) at least on 2 occasions 4 hours apart. Her blood pressures range 120-164/66-98 mm Hg. Patient also received first dose of betamethasone. Patient was admitted last month with increasing liver enzymes that resolved. Hepatitis panel was negative.  On today's ultrasound, the estimated fetal weight is at the 13th percentile. Abdominal circumference measurement is at the 3rd percentile. Amniotic fluid is normal and good fetal activity is seen. Fetal breathing movements did not meet the criteria of BPP. NST is reactive (10 x 10). BPP 8/10. Umbilical artery Doppler study is normal.  Labs: Hb 9.9, Hct 29.6, WBC 25.3, PLT 369, electrolytes normal except potassium (3.3), ALT 9, AST 14, creatinine 0.46. Urine protein 479 mg (increased). Urine toxicology: THC positive.  I counseled the patient on the following: Preeclampsia with severe features: Based on her blood pressure and increased proteinuria, the diagnosis is consistent with preeclampsia with severe features. -I explained the diagnosis and possible complications including eclampsia, pulmonary edema, end-organ damage, coagulation disturbances, placental abruption and fetal growth restriction. -Recommended inpatient management with daily fetal monitoring. -I discussed timing of delivery, which is at 34 weeks. Maternal complications are increased beyond that gestational age with little benefit to neonatal outcomes. -Discussed  antihypertensives that will help prevent maternal complications (stroke).  Recommendations: -Daily NST. -Twice-weekly BPP (next BPP on 04/10/19). -Initiate antihypertensive. -Induction of labor at 34 weeks provided no maternal or fetal complications arise earlier.  Consultation including face-to-face counseling: 30 min. The service was provided via telemedicine. 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

## 2019-04-07 NOTE — Consult Note (Signed)
Antenatal Consultation, Neonatology Requested: Anyanwu Reason: pre-eclampsia [redacted] weeks EGA  I met with the parents and discussed the expected management and duration of hospital stay for preterm babies born at 78 weeks.  We discussed the feeding approaches, risks for long term difficulties, and the steps to achieve before safe discharge.  I suggested that they could call us back if they had further questions.  R. L. Desarai Barrack M.D.

## 2019-04-07 NOTE — Progress Notes (Signed)
FACULTY PRACTICE ANTEPARTUM COMPREHENSIVE PROGRESS NOTE  Jean Mata is a 28 y.o. G1P0 at [redacted]w[redacted]d who is admitted for chronic hypertension with superimposed severe preeclampsia.  Estimated Date of Delivery: 06/09/19 Fetal presentation is unsure.  Length of Stay:  1 Days. Admitted 04/06/2019  Subjective: Patient doing much better today, no complaints. Patient denies any current headaches, visual symptoms, RUQ/epigastric pain or other concerning symptoms. Feels that her symptoms yesterday were caused by her high BP.  Patient reports good fetal movement.  She reports no uterine contractions, no bleeding and no loss of fluid per vagina.  Vitals:  Blood pressure (!) 155/91, pulse 94, temperature 98.1 F (36.7 C), temperature source Oral, resp. rate 17, last menstrual period 10/11/2018, SpO2 98 %.  Patient Vitals for the past 24 hrs:  BP Temp Temp src Pulse Resp SpO2  04/07/19 0600 (!) 155/91 - - 94 17 98 %  04/07/19 0500 127/66 - - 92 15 98 %  04/07/19 0400 (!) 159/87 98.1 F (36.7 C) Oral 92 15 100 %  04/07/19 0300 (!) 150/96 - - 93 17 100 %  04/07/19 0200 120/72 - - 91 14 98 %  04/07/19 0100 (!) 153/96 - - 87 15 100 %  04/07/19 0004 (!) 164/96 - - 94 15 -  04/06/19 2300 (!) 122/57 98.2 F (36.8 C) - 93 15 100 %  04/06/19 2240 140/85 - - 95 16 100 %  04/06/19 2215 (!) 150/91 - - 82 16 100 %  04/06/19 2132 (!) 148/93 98.6 F (37 C) Oral 92 18 100 %  04/06/19 2100 138/71 - - 86 - 100 %  04/06/19 2051 (!) 147/98 - - 83 - -  04/06/19 2049 - - - - - 100 %  04/06/19 2045 (!) 147/98 - - 87 - -  04/06/19 2044 - - - - - 100 %  04/06/19 2039 - - - - - 100 %  04/06/19 2034 - - - - - 100 %  04/06/19 2029 136/81 - - 87 - 100 %  04/06/19 2027 (!) 151/96 - - 87 - -  04/06/19 2024 - - - - - 100 %  04/06/19 2019 - - - - - 99 %  04/06/19 2015 (!) 151/96 - - 85 - -  04/06/19 2014 - - - - - 99 %  04/06/19 2009 - - - - - 100 %  04/06/19 2008 (!) 147/85 - - 88 - 100 %  04/06/19 2004 - - - - - 100 %   04/06/19 2001 (!) 147/85 - - 82 - -  04/06/19 2000 (!) 147/85 - - 82 - -  04/06/19 1959 - - - - - 100 %  04/06/19 1954 - - - - - 100 %  04/06/19 1949 - - - - - 99 %  04/06/19 1945 (!) 153/89 - - 87 - -  04/06/19 1944 - - - - - 100 %  04/06/19 1940 (!) 155/87 - - 88 - -  04/06/19 1939 - - - - - 100 %  04/06/19 1934 - - - - - 100 %  04/06/19 1930 (!) 152/86 - - 83 - -  04/06/19 1929 - - - - - 100 %  04/06/19 1924 - - - - - 100 %  04/06/19 1919 - - - - - 100 %  04/06/19 1915 (!) 156/85 - - 87 - -  04/06/19 1914 - - - - - 100 %  04/06/19 1909 - - - - - 100 %  04/06/19 1904 - - - - - 100 %  04/06/19 1900 (!) 153/87 - - 91 - 100 %  04/06/19 1842 (!) 162/93 - - 94 - -  04/06/19 1813 (!) 161/93 98 F (36.7 C) Oral 97 18 100 %    Physical Examination: CONSTITUTIONAL: Well-developed, well-nourished female in no acute distress.  HENT:  Normocephalic, atraumatic, External right and left ear normal. Oropharynx is clear and moist EYES: Conjunctivae and EOM are normal. Pupils are equal, round, and reactive to light. No scleral icterus.  NECK: Normal range of motion, supple, no masses SKIN: Skin is warm and dry. No rash noted. Not diaphoretic. No erythema. No pallor. NEUROLGIC: Alert and oriented to person, place, and time. Normal reflexes, muscle tone coordination. No cranial nerve deficit noted. PSYCHIATRIC: Normal mood and affect. Normal behavior. Normal judgment and thought content. CARDIOVASCULAR: Normal heart rate noted, regular rhythm RESPIRATORY: Effort and breath sounds normal, no problems with respiration noted MUSCULOSKELETAL: Normal range of motion. No edema and no tenderness. 2+ distal pulses. 2+ DTRs with one beat of clonus.  ABDOMEN: Soft, nontender, nondistended, gravid. CERVIX:    Fetal monitoring: FHR: 145 bpm, Variability: moderate, Accelerations: Present, Decelerations: Absent  Uterine activity: No contractions per hour  Labs: Results for orders placed or performed  during the hospital encounter of 04/06/19 (from the past 48 hour(s))  Urinalysis, Routine w reflex microscopic     Status: Abnormal   Collection Time: 04/06/19  6:15 PM  Result Value Ref Range   Color, Urine YELLOW YELLOW   APPearance HAZY (A) CLEAR   Specific Gravity, Urine 1.021 1.005 - 1.030   pH 6.0 5.0 - 8.0   Glucose, UA 50 (A) NEGATIVE mg/dL   Hgb urine dipstick SMALL (A) NEGATIVE   Bilirubin Urine NEGATIVE NEGATIVE   Ketones, ur 80 (A) NEGATIVE mg/dL   Protein, ur 30 (A) NEGATIVE mg/dL   Nitrite NEGATIVE NEGATIVE   Leukocytes,Ua SMALL (A) NEGATIVE   RBC / HPF 0-5 0 - 5 RBC/hpf   WBC, UA 11-20 0 - 5 WBC/hpf   Bacteria, UA RARE (A) NONE SEEN   Squamous Epithelial / LPF 0-5 0 - 5   Mucus PRESENT     Comment: Performed at [facility], [address], [place], [place] [address]  Protein / creatinine ratio, urine     Status: Abnormal   Collection Time: 04/06/19  6:15 PM  Result Value Ref Range   Creatinine, Urine 157.30 mg/dL   Total Protein, Urine 25 mg/dL    Comment: NO NORMAL RANGE ESTABLISHED FOR THIS TEST   Protein Creatinine Ratio 0.16 (H) 0.00 - 0.15 mg/mg[Cre]    Comment: Performed at [facility], [address], [place], [place] [address]  Urine rapid drug screen (hosp performed)     Status: Abnormal   Collection Time: 04/06/19  6:15 PM  Result Value Ref Range   Opiates NONE DETECTED NONE DETECTED   Cocaine NONE DETECTED NONE DETECTED   Benzodiazepines NONE DETECTED NONE DETECTED   Amphetamines NONE DETECTED NONE DETECTED   Tetrahydrocannabinol POSITIVE (A) NONE DETECTED   Barbiturates NONE DETECTED NONE DETECTED    Comment: (NOTE) DRUG SCREEN FOR MEDICAL PURPOSES ONLY.  IF CONFIRMATION IS NEEDED FOR ANY PURPOSE, NOTIFY LAB WITHIN 5 DAYS. LOWEST DETECTABLE LIMITS FOR URINE DRUG SCREEN Drug Class                     Cutoff (ng/mL) Amphetamine and metabolites    1000 Barbiturate  and metabolites    200 Benzodiazepine                  200 Tricyclics and metabolites     300 Opiates and metabolites        300 Cocaine and metabolites        300 THC                            50 Performed at [facility] Lab, [address], [place], [place] [address]   Type and screen [facility]     Status: None   Collection Time: 04/06/19  6:30 PM  Result Value Ref Range   ABO/RH(D) O POS    Antibody Screen NEG    Sample Expiration      04/09/2019 Performed at [facility], [address] [place], [place] [address]   CBC with Differential/Platelet     Status: Abnormal   Collection Time: 04/06/19  6:37 PM  Result Value Ref Range   WBC 24.8 (H) 4.0 - 10.5 K/uL   RBC 3.82 (L) 3.87 - 5.11 MIL/uL   Hemoglobin 11.3 (L) 12.0 - 15.0 g/dL   HCT 09.8 (L) 11.9 - 14.7 %   MCV 90.8 80.0 - 100.0 fL   MCH 29.6 26.0 - 34.0 pg   MCHC 32.6 30.0 - 36.0 g/dL   RDW 82.9 56.2 - 13.0 %   Platelets 425 (H) 150 - 400 K/uL   nRBC 0.0 0.0 - 0.2 %   Neutrophils Relative % 87 %   Neutro Abs 21.7 (H) 1.7 - 7.7 K/uL   Lymphocytes Relative 9 %   Lymphs Abs 2.1 0.7 - 4.0 K/uL   Monocytes Relative 3 %   Monocytes Absolute 0.8 0.1 - 1.0 K/uL   Eosinophils Relative 0 %   Eosinophils Absolute 0.1 0.0 - 0.5 K/uL   Basophils Relative 0 %   Basophils Absolute 0.1 0.0 - 0.1 K/uL   Immature Granulocytes 1 %   Abs Immature Granulocytes 0.21 (H) 0.00 - 0.07 K/uL    Comment: Performed at [facility] Lab, [address] [place], [place] [address]  Comprehensive metabolic panel     Status: Abnormal   Collection Time: 04/06/19  6:37 PM  Result Value Ref Range   Sodium 133 (L) 135 - 145 mmol/L   Potassium 4.2 3.5 - 5.1 mmol/L   Chloride 104 98 - 111 mmol/L   CO2 16 (L) 22 - 32 mmol/L   Glucose, Bld 101 (H) 70 - 99 mg/dL   BUN 6 6 - 20 mg/dL   Creatinine, Ser 4.69 0.44 - 1.00 mg/dL   Calcium 9.1 8.9 - 62.9 mg/dL   Total Protein 6.9 6.5 - 8.1 g/dL   Albumin 3.2 (L) 3.5 - 5.0 g/dL   AST 32 15 - 41 U/L   ALT 15 0 - 44 U/L    Alkaline Phosphatase 81 38 - 126 U/L   Total Bilirubin 1.3 (H) 0.3 - 1.2 mg/dL   GFR calc non Af Amer >60 >60 mL/min   GFR calc Af Amer >60 >60 mL/min   Anion gap 13 5 - 15    Comment: Performed at [facility] Lab, [address] [place], [place] [address]  Comprehensive metabolic panel     Status: Abnormal   Collection Time: 04/07/19  5:03 AM  Result Value Ref Range   Sodium 133 (L) 135 -  145 mmol/L   Potassium 3.2 (L) 3.5 - 5.1 mmol/L    Comment: DELTA CHECK NOTED   Chloride 103 98 - 111 mmol/L   CO2 19 (L) 22 - 32 mmol/L   Glucose, Bld 130 (H) 70 - 99 mg/dL   BUN <5 (L) 6 - 20 mg/dL   Creatinine, Ser 0.86 0.44 - 1.00 mg/dL   Calcium 8.0 (L) 8.9 - 10.3 mg/dL   Total Protein 6.3 (L) 6.5 - 8.1 g/dL   Albumin 2.7 (L) 3.5 - 5.0 g/dL   AST 14 (L) 15 - 41 U/L   ALT 9 0 - 44 U/L   Alkaline Phosphatase 73 38 - 126 U/L   Total Bilirubin 0.5 0.3 - 1.2 mg/dL   GFR calc non Af Amer >60 >60 mL/min   GFR calc Af Amer >60 >60 mL/min   Anion gap 11 5 - 15    Comment: Performed at [facility], [address], [place], [place] [address]  CBC with Differential     Status: Abnormal   Collection Time: 04/07/19  5:03 AM  Result Value Ref Range   WBC 25.4 (H) 4.0 - 10.5 K/uL   RBC 3.22 (L) 3.87 - 5.11 MIL/uL   Hemoglobin 9.9 (L) 12.0 - 15.0 g/dL   HCT 96.2 (L) 95.2 - 84.1 %   MCV 91.9 80.0 - 100.0 fL   MCH 30.7 26.0 - 34.0 pg   MCHC 33.4 30.0 - 36.0 g/dL   RDW 32.4 40.1 - 02.7 %   Platelets 369 150 - 400 K/uL   nRBC 0.0 0.0 - 0.2 %   Neutrophils Relative % 94 %   Neutro Abs 23.9 (H) 1.7 - 7.7 K/uL   Lymphocytes Relative 5 %   Lymphs Abs 1.3 0.7 - 4.0 K/uL   Monocytes Relative 1 %   Monocytes Absolute 0.3 0.1 - 1.0 K/uL   Eosinophils Relative 0 %   Eosinophils Absolute 0.0 0.0 - 0.5 K/uL   Basophils Relative 0 %   Basophils Absolute 0.0 0.0 - 0.1 K/uL   nRBC 0 0 /100 WBC   Abs Immature Granulocytes 0.00 0.00 - 0.07 K/uL    Comment: Performed at [facility] Lab, [address] [place], [place] [address]  Magnesium     Status: Abnormal   Collection Time: 04/07/19  5:03 AM  Result Value Ref Range   Magnesium 3.9 (H) 1.7 - 2.4 mg/dL    Comment: Performed at [facility], [address], [place], [place] [address]    Imaging: [place] Mfm Ob Detail + 14 Weeks  Result Date: 03/04/2019 ----------------------------------------------------------------------  OBSTETRICS REPORT                       (Signed Final 03/04/2019 09:56 am) ---------------------------------------------------------------------- Patient Info  ID #:       [id number]                          D.O.B.:  July 13, 1991 (27 yrs)  Name:       [name]                   Visit Date: 03/04/2019 08:05 am ---------------------------------------------------------------------- Performed By  Performed By:     Hurman Horn          Ref. Address:     [address]                                                             [facility] [address]                                                             [place], [place]                                                             [address]  Attending:        Noralee Space MD        [facility]  Referred By:      Gerrit Heck                    CNM ---------------------------------------------------------------------- Orders   #  Description                          Code         Ordered By   1  [place] MFM OB DETAIL +14 WK              76811.01     JESSICA [facility]  ----------------------------------------------------------------------   #  Order #                    Accession #                 Episode #   1  [id number]                  [id number]                  [id number]  ---------------------------------------------------------------------- Indications   Encounter for antenatal screening for          Z36.3   malformations   Insufficient Prenatal Care (no prenatal care)  O09.30    Hypertension - Chronic/Pre-existing            O10.019   (labetalol)   [redacted] weeks gestation of pregnancy                Z3A.26  ---------------------------------------------------------------------- Fetal Evaluation  Num Of Fetuses:         1  Fetal Heart Rate(bpm):  150  Cardiac Activity:       Observed  Presentation:           Cephalic  Placenta:  Posterior  P. Cord Insertion:      Visualized  Amniotic Fluid  AFI FV:      Within normal limits                              Largest Pocket(cm)                              4.15 ---------------------------------------------------------------------- Biometry  BPD:      58.2  mm     G. Age:  23w 6d        < 1  %    CI:        80.42   %    70 - 86                                                          FL/HC:      21.8   %    18.6 - 20.4  HC:       205   mm     G. Age:  22w 4d        < 3  %    HC/AC:      1.03        1.04 - 1.22  AC:      198.8  mm     G. Age:  24w 4d          7  %    FL/BPD:     76.6   %    71 - 87  FL:       44.6  mm     G. Age:  24w 5d          7  %    FL/AC:      22.4   %    20 - 24  HUM:      39.8  mm     G. Age:  24w 2d        < 5  %  CER:      27.4  mm     G. Age:  24w 5d         23  %  Est. FW:     689  gm      1 lb 8 oz     20  % ---------------------------------------------------------------------- OB History  Gravidity:    1 ---------------------------------------------------------------------- Gestational Age  LMP:           20w 4d        Date:  10/11/18                 EDD:   07/18/19  U/S Today:     24w 0d                                        EDD:   06/24/19  Best:          26w 1d     Det. By:  Previous Ultrasound      EDD:   06/09/19                                      (  10/30/18) ---------------------------------------------------------------------- Anatomy  Cranium:               Appears normal         Aortic Arch:            Appears normal  Cavum:                 Appears normal         Ductal Arch:            Not well  visualized  Ventricles:            Appears normal         Diaphragm:              Appears normal  Choroid Plexus:        Appears normal         Stomach:                Appears normal, left                                                                        sided  Cerebellum:            Appears normal         Abdomen:                Appears normal  Posterior Fossa:       Not well visualized    Abdominal Wall:         Appears nml (cord                                                                        insert, abd wall)  Nuchal Fold:           Not well visualized    Cord Vessels:           Appears normal (3                                                                        vessel cord)  Face:                  Orbits appear          Kidneys:                Appear normal                         normal  Lips:                  Appears normal         Bladder:  Appears normal  Thoracic:              Appears normal         Spine:                  Appears normal  Heart:                 Not well visualized    Upper Extremities:      Appears normal  RVOT:                  Not well visualized    Lower Extremities:      Appears normal  LVOT:                  Not well visualized  Other:  Gender not well visualized. Technically difficult due to maternal          habitus and fetal position. ---------------------------------------------------------------------- Cervix Uterus Adnexa  Cervix  Length:           3.16  cm.  Normal appearance by transabdominal scan.  Uterus  No abnormality visualized.  Left Ovary  Within normal limits.  Right Ovary  Within normal limits.  Adnexa  No abnormality visualized. ---------------------------------------------------------------------- Impression  Patient was admitted with c/o nausea and vomiting. Liver  enzymes are increased, but platelets are normal. Patient has  hypertension and is being evaluated for preeclampsia.  Fetal growth is appropriate for gestational age. Amniotic  fluid  is normal and good fetal activity is seen. Fetal anatomy  appears normal. Cardiac anatomy and posterior fossa could  not be evaluated. ---------------------------------------------------------------------- Recommendations  -Follow-up ultrasound (limited) next week if she is still an  inpatient. ----------------------------------------------------------------------                  Noralee Spaceavi Shankar, MD Electronically Signed Final Report   03/04/2019 09:56 am ----------------------------------------------------------------------  [place] Abdomen Limited Ruq  Result Date: 03/03/2019 CLINICAL DATA:  Epigastric pain for 1 week. Twenty-four weeks pregnant. EXAM: ULTRASOUND ABDOMEN LIMITED RIGHT UPPER QUADRANT COMPARISON:  None. FINDINGS: Gallbladder: Small amount of dependent sludge. Tiny, 3 mm dependent stone. No wall thickening. No pericholecystic fluid. Common bile duct: Diameter: 4 mm Liver: No focal lesion identified. Within normal limits in parenchymal echogenicity. Portal vein is patent on color Doppler imaging with normal direction of blood flow towards the liver. IMPRESSION: 1. No acute findings. 2. Tiny gallstone and small amount of dependent gallbladder sludge. No acute cholecystitis. No bile duct dilation. Electronically Signed   By: Amie Portlandavid  Ormond M.D.   On: 03/03/2019 22:34    Current scheduled medications . betamethasone acetate-betamethasone sodium phosphate  12 mg Intramuscular Q24H  . docusate sodium  100 mg Oral Daily  . pantoprazole  40 mg Oral BID  . potassium chloride  40 mEq Oral BID  . prenatal multivitamin  1 tablet Oral Q1200  Magnesium sulfate 2g/hr  IV infusion  I have reviewed the patient's current medications.  ASSESSMENT: Principal Problem:   Chronic hypertension with superimposed severe preeclampsia Active Problems:   No prenatal care in current pregnancy   Non-intractable vomiting   Hypokalemia   Gastroenteritis   PLAN: Severe BPs on arrival to [facility] yesterday with  symptoms, and had another severe range BP overnight. No current symptoms.  BP is still on high end this morning. Will  continue magnesium sulfate for now and Labetalol IV as needed per protocol, has only received one dose of IV Labetaolol thus far. No maintenance antihypertensive  ordered yet, will consider the appropriate therapy today. Repeat betamethasone regimen ongoing. MFM consult ordered in addition to growth scans/AFI and BPP.  Of note, EFW was 20% on 03/04/19 with most of the biometric measurement indices <10%.  Will replete potassium and recheck labs in the morning Unable to do GTT yesterday due to N/V, this will be done later. Reassuring FHT tracing, follow up ultrasoun results today. Neonatalogy consult placed (talked to Dr. Eric Form on the phone) Continue routine antenatal care.     Jaynie Collins, MD, FACOG Obstetrician & Gynecologist, [facility] for [employer], [facility]

## 2019-04-08 DIAGNOSIS — F3131 Bipolar disorder, current episode depressed, mild: Secondary | ICD-10-CM

## 2019-04-08 DIAGNOSIS — F319 Bipolar disorder, unspecified: Secondary | ICD-10-CM | POA: Diagnosis present

## 2019-04-08 DIAGNOSIS — Z3A31 31 weeks gestation of pregnancy: Secondary | ICD-10-CM

## 2019-04-08 LAB — COMPREHENSIVE METABOLIC PANEL
ALT: 13 U/L (ref 0–44)
AST: 26 U/L (ref 15–41)
Albumin: 3 g/dL — ABNORMAL LOW (ref 3.5–5.0)
Alkaline Phosphatase: 74 U/L (ref 38–126)
Anion gap: 11 (ref 5–15)
BUN: <5 mg/dL — ABNORMAL LOW (ref 6–20)
CO2: 19 mmol/L — ABNORMAL LOW (ref 22–32)
Calcium: 8.3 mg/dL — ABNORMAL LOW (ref 8.9–10.3)
Chloride: 105 mmol/L (ref 98–111)
Creatinine, Ser: 0.59 mg/dL (ref 0.44–1.00)
GFR calc Af Amer: >60 mL/min (ref >60)
GFR calc non Af Amer: >60 mL/min (ref >60)
Glucose, Bld: 136 mg/dL — ABNORMAL HIGH (ref 70–99)
Potassium: 3.3 mmol/L — ABNORMAL LOW (ref 3.5–5.1)
Sodium: 135 mmol/L (ref 135–145)
Total Bilirubin: 0.4 mg/dL (ref 0.3–1.2)
Total Protein: 6.7 g/dL (ref 6.5–8.1)

## 2019-04-08 LAB — CBC WITH DIFFERENTIAL/PLATELET
Abs Immature Granulocytes: 0.28 10*3/uL — ABNORMAL HIGH (ref 0.00–0.07)
Basophils Absolute: 0 10*3/uL (ref 0.0–0.1)
Basophils Relative: 0 %
Eosinophils Absolute: 0 10*3/uL (ref 0.0–0.5)
Eosinophils Relative: 0 %
HCT: 29.8 % — ABNORMAL LOW (ref 36.0–46.0)
Hemoglobin: 10 g/dL — ABNORMAL LOW (ref 12.0–15.0)
Immature Granulocytes: 1 %
Lymphocytes Relative: 5 %
Lymphs Abs: 1.3 10*3/uL (ref 0.7–4.0)
MCH: 30.7 pg (ref 26.0–34.0)
MCHC: 33.6 g/dL (ref 30.0–36.0)
MCV: 91.4 fL (ref 80.0–100.0)
Monocytes Absolute: 0.7 10*3/uL (ref 0.1–1.0)
Monocytes Relative: 3 %
Neutro Abs: 21.2 10*3/uL — ABNORMAL HIGH (ref 1.7–7.7)
Neutrophils Relative %: 91 %
Platelets: 411 10*3/uL — ABNORMAL HIGH (ref 150–400)
RBC: 3.26 MIL/uL — ABNORMAL LOW (ref 3.87–5.11)
RDW: 13.2 % (ref 11.5–15.5)
WBC: 23.5 10*3/uL — ABNORMAL HIGH (ref 4.0–10.5)
nRBC: 0 % (ref 0.0–0.2)

## 2019-04-08 LAB — CULTURE, OB URINE: Special Requests: NORMAL

## 2019-04-08 MED ORDER — OLANZAPINE 5 MG PO TABS
5.0000 mg | ORAL_TABLET | Freq: Every day | ORAL | Status: DC
  Administered 2019-04-08 – 2019-04-14 (×7): 5 mg via ORAL
  Filled 2019-04-08 (×8): qty 1

## 2019-04-08 NOTE — Progress Notes (Signed)
FACULTY PRACTICE ANTEPARTUM PROGRESS NOTE  Jean Mata is a 28 y.o. G1P0 at [redacted]w[redacted]d who is admitted for chronic HTN with superimposed preeclampsia with severe features.  Estimated Date of Delivery: 06/09/19 Fetal presentation is cephalic.  Length of Stay:  2 Days. Admitted 04/06/2019  Subjective:  Patient reports normal fetal movement. Reports she had a couple of "pink drops" in her urine yesterday but denies bleeding. She denies uterine contractions, denies and leaking of fluid per vagina. She is concerned that her mood has been labile, diagnosed with bipolar last year and was on depakote, requesting medication because she feels her moods elevating.  Vitals:  Blood pressure 119/73, pulse 90, temperature 98 F (36.7 C), temperature source Oral, resp. rate 18, height 6\' 2"  (1.88 m), weight 103.6 kg, last menstrual period 10/11/2018, SpO2 99 %. Physical Examination: CONSTITUTIONAL: Well-developed, well-nourished female in no acute distress.  HENT:  Normocephalic, atraumatic, External right and left ear normal. Oropharynx is clear and moist EYES: Conjunctivae and EOM are normal. Pupils are equal, round, and reactive to light. No scleral icterus.  NECK: Normal range of motion, supple, no masses. SKIN: Skin is warm and dry. No rash noted. Not diaphoretic. No erythema. No pallor. NEUROLGIC: Alert and oriented to person, place, and time. Normal reflexes, muscle tone coordination. No cranial nerve deficit noted. PSYCHIATRIC: Normal mood and affect. Normal behavior. Normal judgment and thought content. CARDIOVASCULAR: Normal heart rate noted, regular rhythm RESPIRATORY: Effort normal, no problems with respiration noted MUSCULOSKELETAL: Normal range of motion. No edema and no tenderness. ABDOMEN: Soft, nontender, nondistended, gravid. CERVIX: deferred  Fetal monitoring: FHR: 135 bpm, Variability: moderate, Accelerations: Present, Decelerations: Absent  Uterine activity: no contractions per hour  I  have reviewed the patient's current medications.  ASSESSMENT: Principal Problem:   Chronic hypertension with superimposed severe preeclampsia Active Problems:   No prenatal care in current pregnancy   Non-intractable vomiting   Hypokalemia   Gastroenteritis   Bipolar disorder (HCC)   PLAN: Reviewed plan with patient for inpatient management until delivery at 34 weeks. Reviewed possibility of earlier delivery for worsening either maternal or fetal status. Reviewed we would increase anti-hypertensives as needed. Reviewed risks of prematurity of delivery infant early versus risks to patient if she becomes severely ill. Reviewed plan for induction of labor however she may have condition where we would recommend c-section. Reviewed possibility of urgent c-section but no plans for it now. Patient did express a concern about her labile moods and requested to go back on depakote, reviewed we try to avoid meds in pregnancy if possible and would weigh risks/benefits of mood stabilizers, patient requests to speak with psychiatry if possible, consult placed.   Cont labetalol 100 mg BID for now S/p NICU consult S/p BTMZ (original + rescue course) Plan for delivery at 34 weeks Daily NST Twice weekly BPP Psych consult Regular diet   Continue routine antenatal care.   Baldemar Lenis, M.D. Attending Center for [employer] (Faculty Practice)  04/08/2019 12:09 PM
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
## 2019-04-09 NOTE — Progress Notes (Signed)
FACULTY PRACTICE ANTEPARTUM PROGRESS NOTE  Jean Mata is a 28 y.o. G1P0 at [redacted]w[redacted]d who is admitted for cHTN, with superimposed preeclampsia.  Estimated Date of Delivery: 06/09/19 Fetal presentation is cephalic.  Length of Stay:  3 Days. Admitted 04/06/2019  Subjective:  Patient reports normal fetal movement.  She denies uterine contractions, denies bleeding and leaking of fluid per vagina. Started on zyprexa last pm, has not noticed improvement yet.  Vitals:  Blood pressure (!) 105/54, pulse 78, temperature 98.4 F (36.9 C), resp. rate 20, height 6\' 2"  (1.88 m), weight 103.6 kg, last menstrual period 10/11/2018, SpO2 100 %. Physical Examination: CONSTITUTIONAL: Well-developed, well-nourished female in no acute distress.  HENT:  Normocephalic, atraumatic, External right and left ear normal. Oropharynx is clear and moist EYES: Conjunctivae and EOM are normal. Pupils are equal, round, and reactive to light. No scleral icterus.  NECK: Normal range of motion, supple, no masses. SKIN: Skin is warm and dry. No rash noted. Not diaphoretic. No erythema. No pallor. NEUROLGIC: Alert and oriented to person, place, and time. Normal reflexes, muscle tone coordination. No cranial nerve deficit noted. PSYCHIATRIC: Normal mood and affect. Normal behavior. Normal judgment and thought content. CARDIOVASCULAR: Normal heart rate noted, regular rhythm RESPIRATORY: Effort normal, no problems with respiration noted MUSCULOSKELETAL: Normal range of motion. No edema and no tenderness. ABDOMEN: Soft, nontender, nondistended, gravid. CERVIX: deferred  From yesterday Fetal monitoring: FHR: 130 bpm, Variability: moderate, Accelerations: Present, Decelerations: Absent  Uterine activity: no contractions per hour   I have reviewed the patient's current medications.  ASSESSMENT: Principal Problem:   Mild bipolar I disorder, most recent episode depressed (HCC) Active Problems:   Chronic hypertension with  superimposed severe preeclampsia   No prenatal care in current pregnancy   Non-intractable vomiting   Hypokalemia   Gastroenteritis   Bipolar disorder (HCC)   PLAN: Cont labetalol 100 mg BID S/p NICU consult S/p BTMZ (original + rescue course) Plan for delivery at 34 weeks Daily NST Twice weekly BPP S/p Psych consult, she was started on zyprexa 5 mg QHS per recommendation Regular diet  Appreciate psych recommendations. Continue routine antenatal care.   Baldemar Lenis, M.D. Attending [facility] [employer] (Faculty Practice)  04/09/2019 9:52 AM

## 2019-04-10 ENCOUNTER — Encounter (HOSPITAL_COMMUNITY): Payer: Self-pay | Admitting: Certified Registered Nurse Anesthetist

## 2019-04-10 ENCOUNTER — Inpatient Hospital Stay (HOSPITAL_COMMUNITY): Payer: BLUE CROSS/BLUE SHIELD

## 2019-04-10 DIAGNOSIS — Z3A31 31 weeks gestation of pregnancy: Secondary | ICD-10-CM

## 2019-04-10 DIAGNOSIS — O0933 Supervision of pregnancy with insufficient antenatal care, third trimester: Secondary | ICD-10-CM

## 2019-04-10 DIAGNOSIS — O365930 Maternal care for other known or suspected poor fetal growth, third trimester, not applicable or unspecified: Secondary | ICD-10-CM

## 2019-04-10 DIAGNOSIS — O1493 Unspecified pre-eclampsia, third trimester: Secondary | ICD-10-CM

## 2019-04-10 DIAGNOSIS — O10013 Pre-existing essential hypertension complicating pregnancy, third trimester: Secondary | ICD-10-CM

## 2019-04-10 MED ORDER — ONDANSETRON HCL 4 MG PO TABS
8.0000 mg | ORAL_TABLET | Freq: Three times a day (TID) | ORAL | Status: DC | PRN
  Filled 2019-04-10: qty 2

## 2019-04-10 MED ORDER — ONDANSETRON HCL 4 MG PO TABS
4.0000 mg | ORAL_TABLET | Freq: Three times a day (TID) | ORAL | Status: DC | PRN
  Administered 2019-04-10: 14:00:00 4 mg via ORAL

## 2019-04-10 MED ORDER — BISACODYL 10 MG RE SUPP
10.0000 mg | Freq: Once | RECTAL | Status: AC
  Administered 2019-04-10: 10 mg via RECTAL
  Filled 2019-04-10: qty 1

## 2019-04-10 NOTE — Progress Notes (Signed)
CSW met with MOB in room 107 at the request of MOB.  CSW did not complete a full clinical assessment due to CSW completing an assessment on 03/05/2019.  When CSW arrived MOB was resting in bed and FOB was gaming on his personal TV.  CSW explained CSW's role and MOB immediately recognize CSW from her previous admission. MOB gave verbal permission for CSW to complete the assessment while FOB was present. FOB did not engage with CSW and MOB had to request that FOB turn the volume of his gaming system down 3x during the assessment.    CSW asked about MOB's thoughts and feeling about being inpatient until delivery and MOB responded, "I'm ok."  MOB also shared her desire to parent her baby as oppose to doing an adoption plan.  MOB communicated "I have the support of my family and I am looking forward to being a mother to [place]."  MOB inquired about applying for SSI benefits, Food Stamps, and Medicaid.  CSW provided MOB with application information for all three programs and encouraged MOB to apply today; MOB agreed.    CSW made MOB aware that CSW will check in with MOB weekly to assess for psychosocial stressors, needed resources and supports; MOB was appreciative.   Laurey Arrow, MSW, LCSW Clinical Social Work [phone]

## 2019-04-10 NOTE — Progress Notes (Signed)
FACULTY PRACTICE ANTEPARTUM PROGRESS NOTE  Jean Mata is a 28 y.o. G1P0 at [redacted]w[redacted]d who is admitted for cHTN, with superimposed preeclampsia  Estimated Date of Delivery: 06/09/19 Fetal presentation is cephalic.  Length of Stay:  4 Days. Admitted 04/06/2019  Subjective:  Patient reports normal fetal movement.  She denies uterine contractions, denies bleeding and leaking of fluid per vagina. Reports she slept almost 12 hours last night, concerned her mood stabilizer may be the cause  Vitals:  Blood pressure 137/85, pulse 94, temperature 98.3 F (36.8 C), temperature source Oral, resp. rate 18, height 6\' 2"  (1.88 m), weight 103.6 kg, last menstrual period 10/11/2018, SpO2 100 %. Physical Examination: CONSTITUTIONAL: Well-developed, well-nourished female in no acute distress.  HENT:  Normocephalic, atraumatic, External right and left ear normal. Oropharynx is clear and moist EYES: Conjunctivae and EOM are normal. Pupils are equal, round, and reactive to light. No scleral icterus.  NECK: Normal range of motion, supple, no masses. SKIN: Skin is warm and dry. No rash noted. Not diaphoretic. No erythema. No pallor. NEUROLGIC: Alert and oriented to person, place, and time. Normal reflexes, muscle tone coordination. No cranial nerve deficit noted. PSYCHIATRIC: Normal mood and affect. Normal behavior. Normal judgment and thought content. CARDIOVASCULAR: Normal heart rate noted, regular rhythm RESPIRATORY: Effort normal, no problems with respiration noted MUSCULOSKELETAL: Normal range of motion. No edema and no tenderness. ABDOMEN: Soft, nontender, nondistended, gravid. CERVIX: deferred  Fetal monitoring: FHR: 150 bpm, Variability: moderate, Accelerations: Present, Decelerations: Absent  Uterine activity: no contractions per hour  No results found for this or any previous visit (from the past 48 hour(s)).  I have reviewed the patient's current medications.  ASSESSMENT: Principal Problem:   Mild  bipolar I disorder, most recent episode depressed (HCC) Active Problems:   Chronic hypertension with superimposed severe preeclampsia   No prenatal care in current pregnancy   Non-intractable vomiting   Hypokalemia   Gastroenteritis   Bipolar disorder (HCC)   PLAN: Cont labetalol 100 mg BID S/p NICU consult S/p BTMZ (original + rescue course) Daily NST Twice weekly BPP S/p Psych consult for bipolar, will cont to monitor for side effects of zyprexa Regular diet Plan for delivery at 34 weeks  Continue routine antenatal care.   Baldemar Lenis, M.D. Attending Center for [employer] (Faculty Practice)  04/10/2019 10:29 AM

## 2019-04-11 ENCOUNTER — Encounter (HOSPITAL_COMMUNITY): Payer: Self-pay

## 2019-04-11 MED ORDER — LABETALOL HCL 200 MG PO TABS
200.0000 mg | ORAL_TABLET | Freq: Two times a day (BID) | ORAL | Status: DC
  Administered 2019-04-11 – 2019-04-12 (×4): 200 mg via ORAL
  Filled 2019-04-11 (×4): qty 1

## 2019-04-11 MED ORDER — POLYETHYLENE GLYCOL 3350 17 G PO PACK
17.0000 g | PACK | Freq: Every day | ORAL | Status: DC
  Administered 2019-04-11 – 2019-04-12 (×2): 17 g via ORAL
  Filled 2019-04-11 (×2): qty 1

## 2019-04-11 NOTE — Progress Notes (Signed)
FACULTY PRACTICE ANTEPARTUM PROGRESS NOTE  Jean Mata is a 28 y.o. G1P0 at [redacted]w[redacted]d who is admitted for chronic HTN with superimpose severe preeclampsia.  Estimated Date of Delivery: 06/09/19 Fetal presentation is cephalic.  Length of Stay:  5 Days. Admitted 04/06/2019  Subjective:  Patient reports normal fetal movement.  She denies uterine contractions, denies bleeding and leaking of fluid per vagina. Reports she has been sleeping much better.   Vitals:  Blood pressure 135/86, pulse 90, temperature 98.4 F (36.9 C), temperature source Oral, resp. rate 18, height 6\' 2"  (1.88 m), weight 103.6 kg, last menstrual period 10/11/2018, SpO2 100 %. Physical Examination: CONSTITUTIONAL: Well-developed, well-nourished female in no acute distress.  HENT:  Normocephalic, atraumatic, External right and left ear normal. Oropharynx is clear and moist EYES: Conjunctivae and EOM are normal. Pupils are equal, round, and reactive to light. No scleral icterus.  NECK: Normal range of motion, supple, no masses. SKIN: Skin is warm and dry. No rash noted. Not diaphoretic. No erythema. No pallor. NEUROLGIC: Alert and oriented to person, place, and time. Normal reflexes, muscle tone coordination. No cranial nerve deficit noted. PSYCHIATRIC: Normal mood and affect. Normal behavior. Normal judgment and thought content. CARDIOVASCULAR: Normal heart rate noted, regular rhythm RESPIRATORY: Effort normal, no problems with respiration noted MUSCULOSKELETAL: Normal range of motion. No edema and no tenderness. ABDOMEN: Soft, nontender, nondistended, gravid. CERVIX: deferred  Fetal monitoring: FHR: 145 bpm, Variability: moderate, Accelerations: Present, Decelerations: Absent  Uterine activity: no contractions per hour  No results found for this or any previous visit (from the past 48 hour(s)).  I have reviewed the patient's current medications.  ASSESSMENT: Principal Problem:   Mild bipolar I disorder, most recent  episode depressed (HCC) Active Problems:   Chronic hypertension with superimposed severe preeclampsia   No prenatal care in current pregnancy   Non-intractable vomiting   Hypokalemia   Gastroenteritis   Bipolar disorder (HCC)   PLAN: inc labetalol to 200 mg BID S/p NICU consult S/p BTMZ (original + rescue course) Daily NST Twice weekly BPP S/pPsych consult for bipolar, on zyprexa Regular diet Plan for delivery at 34 weeks   Continue routine antenatal care.   Baldemar Lenis, M.D. Attending Center for [employer] (Faculty Practice)  04/11/2019 10:20 AM

## 2019-04-12 DIAGNOSIS — O1493 Unspecified pre-eclampsia, third trimester: Secondary | ICD-10-CM

## 2019-04-12 LAB — COMPREHENSIVE METABOLIC PANEL
ALT: 179 U/L — ABNORMAL HIGH (ref 0–44)
ALT: 212 U/L — ABNORMAL HIGH (ref 0–44)
AST: 135 U/L — ABNORMAL HIGH (ref 15–41)
AST: 155 U/L — ABNORMAL HIGH (ref 15–41)
Albumin: 2.9 g/dL — ABNORMAL LOW (ref 3.5–5.0)
Albumin: 3.2 g/dL — ABNORMAL LOW (ref 3.5–5.0)
Alkaline Phosphatase: 102 U/L (ref 38–126)
Alkaline Phosphatase: 114 U/L (ref 38–126)
Anion gap: 12 (ref 5–15)
Anion gap: 14 (ref 5–15)
BUN: 7 mg/dL (ref 6–20)
BUN: 7 mg/dL (ref 6–20)
CO2: 18 mmol/L — ABNORMAL LOW (ref 22–32)
CO2: 20 mmol/L — ABNORMAL LOW (ref 22–32)
Calcium: 9.4 mg/dL (ref 8.9–10.3)
Calcium: 9.4 mg/dL (ref 8.9–10.3)
Chloride: 102 mmol/L (ref 98–111)
Chloride: 100 mmol/L (ref 98–111)
Creatinine, Ser: 0.6 mg/dL (ref 0.44–1.00)
Creatinine, Ser: 0.58 mg/dL (ref 0.44–1.00)
GFR calc Af Amer: >60 mL/min (ref >60)
GFR calc Af Amer: >60 mL/min (ref >60)
GFR calc non Af Amer: >60 mL/min (ref >60)
GFR calc non Af Amer: >60 mL/min (ref >60)
Glucose, Bld: 128 mg/dL — ABNORMAL HIGH (ref 70–99)
Glucose, Bld: 100 mg/dL — ABNORMAL HIGH (ref 70–99)
Potassium: 3.1 mmol/L — ABNORMAL LOW (ref 3.5–5.1)
Potassium: 3.3 mmol/L — ABNORMAL LOW (ref 3.5–5.1)
Sodium: 132 mmol/L — ABNORMAL LOW (ref 135–145)
Sodium: 134 mmol/L — ABNORMAL LOW (ref 135–145)
Total Bilirubin: 1 mg/dL (ref 0.3–1.2)
Total Bilirubin: 1 mg/dL (ref 0.3–1.2)
Total Protein: 7.1 g/dL (ref 6.5–8.1)
Total Protein: 8 g/dL (ref 6.5–8.1)

## 2019-04-12 LAB — CBC
HCT: 34.9 % — ABNORMAL LOW (ref 36.0–46.0)
Hemoglobin: 11.6 g/dL — ABNORMAL LOW (ref 12.0–15.0)
MCH: 30.1 pg (ref 26.0–34.0)
MCHC: 33.2 g/dL (ref 30.0–36.0)
MCV: 90.6 fL (ref 80.0–100.0)
Platelets: 398 10*3/uL (ref 150–400)
RBC: 3.85 MIL/uL — ABNORMAL LOW (ref 3.87–5.11)
RDW: 13 % (ref 11.5–15.5)
WBC: 17 10*3/uL — ABNORMAL HIGH (ref 4.0–10.5)
nRBC: 0 % (ref 0.0–0.2)

## 2019-04-12 LAB — APTT: aPTT: 26 seconds (ref 24–36)

## 2019-04-12 LAB — PROTIME-INR
INR: 1 (ref 0.8–1.2)
Prothrombin Time: 13 seconds (ref 11.4–15.2)

## 2019-04-12 LAB — TYPE AND SCREEN
ABO/RH(D): O POS
Antibody Screen: NEGATIVE

## 2019-04-12 LAB — FIBRINOGEN: Fibrinogen: 767 mg/dL — ABNORMAL HIGH (ref 210–475)

## 2019-04-12 MED ORDER — SODIUM CHLORIDE 0.9% FLUSH: 10.0000 mL | INTRAVENOUS | Status: DC | PRN

## 2019-04-12 MED ORDER — POLYETHYLENE GLYCOL 3350 17 G PO PACK
17.0000 g | PACK | Freq: Three times a day (TID) | ORAL | Status: DC
  Administered 2019-04-12 – 2019-04-15 (×7): 17 g via ORAL
  Filled 2019-04-12 (×10): qty 1

## 2019-04-12 MED ORDER — POTASSIUM CHLORIDE CRYS ER 20 MEQ PO TBCR
30.0000 meq | EXTENDED_RELEASE_TABLET | Freq: Two times a day (BID) | ORAL | Status: DC
  Administered 2019-04-13 – 2019-04-14 (×3): 30 meq via ORAL
  Filled 2019-04-12 (×6): qty 2

## 2019-04-12 MED ORDER — FLEET ENEMA 7-19 GM/118ML RE ENEM
1.0000 | ENEMA | Freq: Once | RECTAL | Status: AC
  Administered 2019-04-12: 1 via RECTAL

## 2019-04-12 MED ORDER — POLYETHYLENE GLYCOL 3350 17 G PO PACK: 17.0000 g | PACK | Freq: Every day | ORAL | Status: DC

## 2019-04-12 MED ORDER — LACTATED RINGERS IV BOLUS
1000.0000 mL | Freq: Once | INTRAVENOUS | Status: AC
  Administered 2019-04-12: 1000 mL via INTRAVENOUS

## 2019-04-12 MED ORDER — BISACODYL 10 MG RE SUPP
10.0000 mg | Freq: Once | RECTAL | Status: DC
  Filled 2019-04-12: qty 1

## 2019-04-12 NOTE — Progress Notes (Signed)
Nurse has asked patient multiple times about putting her on the monitor for fetal monitoring for this shift and patient has repeatedly said she'll do it later. Patient reports + fetal movement.

## 2019-04-12 NOTE — Progress Notes (Signed)
Daily Antepartum Note  Admission Date: 04/06/2019 Current Date: 04/12/2019 10:11 AM  Jean Mata is a 28 y.o. G1 @ [redacted]w[redacted]d, HD#7, admitted for cHTN with superimposed pre-eclampsia with severe features (HTN, neuro, h/o elevated LFTs).  Pregnancy complicated by: Patient Active Problem List   Diagnosis Date Noted  . Bipolar disorder (HCC) 04/08/2019  . Mild bipolar I disorder, most recent episode depressed (HCC)   . Gastroenteritis 04/06/2019  . Chronic hypertension with superimposed severe preeclampsia 03/04/2019  . No prenatal care in current pregnancy 03/04/2019  . Non-intractable vomiting 03/04/2019  . Hypokalemia 03/04/2019    Overnight/24hr events:  none  Subjective:  No s/s of pre-eclampsia, decreased FM, labor  Objective:    Current Vital Signs 24h Vital Sign Ranges  T 98.1 F (36.7 C) Temp  Avg: 98.2 F (36.8 C)  Min: 98.1 F (36.7 C)  Max: 98.5 F (36.9 C)  BP (!) 144/81 BP  Min: 126/82  Max: 160/102  HR 94 Pulse  Avg: 97.5  Min: 89  Max: 108  RR 18 Resp  Avg: 17.8  Min: 17  Max: 18  SaO2 100 % Room Air SpO2  Avg: 99.1 %  Min: 97 %  Max: 100 %       24 Hour I/O Current Shift I/O  Time Ins Outs 04/24 0701 - 04/25 0700 In: 120 [P.O.:120] Out: 625 [Urine:625] No intake/output data recorded.   Patient Vitals for the past 24 hrs:  BP Temp Temp src Pulse Resp SpO2  04/12/19 0410 - - - - - 100 %  04/12/19 0409 (!) 144/81 98.1 F (36.7 C) Oral 94 18 100 %  04/11/19 2331 (!) 146/92 - - 98 - -  04/11/19 2318 (!) 160/102 98.4 F (36.9 C) Oral 98 18 100 %  04/11/19 1945 - - - - - 100 %  04/11/19 1944 (!) 136/96 98.1 F (36.7 C) Oral 98 18 100 %  04/11/19 1616 126/82 98.5 F (36.9 C) Oral (!) 108 17 97 %  04/11/19 1200 138/83 98.1 F (36.7 C) Oral 89 18 97 %   EFM: 150 baseline, ?accels, no decel, mod variability Toco: quiet  Physical exam: General: Well nourished, well developed female in no acute distress. Abdomen: gravid nttp Cardiovascular: S1, S2  normal, no murmur, rub or gallop, regular rate and rhythm Respiratory: CTAB Extremities: no clubbing, cyanosis or edema Skin: Warm and dry.   Medications: Current Facility-Administered Medications  Medication Dose Route Frequency Provider Last Rate Last Dose  . acetaminophen (TYLENOL) tablet 650 mg  650 mg Oral Q4H PRN Anyanwu, Ugonna A, MD   650 mg at 04/11/19 1650  . calcium carbonate (TUMS - dosed in mg elemental calcium) chewable tablet 400 mg of elemental calcium  2 tablet Oral Q4H PRN Anyanwu, Ugonna A, MD   400 mg of elemental calcium at 04/10/19 0226  . docusate sodium (COLACE) capsule 100 mg  100 mg Oral Daily Anyanwu, Ugonna A, MD   100 mg at 04/11/19 0930  . labetalol (NORMODYNE) injection 20 mg  20 mg Intravenous PRN Anyanwu, Ugonna A, MD       And  . labetalol (NORMODYNE) injection 40 mg  40 mg Intravenous PRN Anyanwu, Ugonna A, MD       And  . labetalol (NORMODYNE) injection 80 mg  80 mg Intravenous PRN Anyanwu, Ugonna A, MD       And  . hydrALAZINE (APRESOLINE) injection 10 mg  10 mg Intravenous PRN Anyanwu, Jethro Bastos, MD      .  labetalol (NORMODYNE) tablet 200 mg  200 mg Oral BID Conan Bowensavis, Kelly M, MD   200 mg at 04/11/19 2212  . metoCLOPramide (REGLAN) injection 10 mg  10 mg Intravenous Q6H PRN Anyanwu, Jethro BastosUgonna A, MD   10 mg at 04/10/19 0137  . OLANZapine (ZYPREXA) tablet 5 mg  5 mg Oral QHS Levie HeritageStinson, Jacob J, DO   5 mg at 04/11/19 2212  . ondansetron (ZOFRAN) injection 4 mg  4 mg Intravenous Q6H PRN Anyanwu, Jethro BastosUgonna A, MD   4 mg at 04/11/19 1754  . ondansetron (ZOFRAN) tablet 4 mg  4 mg Oral Q8H PRN Conan Bowensavis, Kelly M, MD   4 mg at 04/10/19 1417  . pantoprazole (PROTONIX) EC tablet 40 mg  40 mg Oral BID Anyanwu, Ugonna A, MD   40 mg at 04/11/19 2212  . polyethylene glycol (MIRALAX / GLYCOLAX) packet 17 g  17 g Oral Daily Conan Bowensavis, Kelly M, MD   17 g at 04/11/19 1103  . prenatal multivitamin tablet 1 tablet  1 tablet Oral Q1200 Anyanwu, Jethro BastosUgonna A, MD   1 tablet at 04/11/19 1103  .  promethazine (PHENERGAN) injection 25 mg  25 mg Intravenous Q6H PRN Anyanwu, Jethro BastosUgonna A, MD   25 mg at 04/11/19 1956    Labs:  Recent Labs  Lab 04/06/19 1837 04/07/19 0503 04/08/19 0535  WBC 24.8* 25.4* 23.5*  HGB 11.3* 9.9* 10.0*  HCT 34.7* 29.6* 29.8*  PLT 425* 369 411*    Recent Labs  Lab 04/06/19 1837 04/07/19 0503 04/08/19 0535  NA 133* 133* 135  K 4.2 3.2* 3.3*  CL 104 103 105  CO2 16* 19* 19*  BUN 6 <5* <5*  CREATININE 0.49 0.46 0.59  CALCIUM 9.1 8.0* 8.3*  PROT 6.9 6.3* 6.7  BILITOT 1.3* 0.5 0.4  ALKPHOS 81 73 74  ALT 15 9 13   AST 32 14* 26  GLUCOSE 101* 130* 136*     Radiology: no new imaging 4/23: ceph, afi 13.5, bpp 8/8, nl s/d 4/20: 13%, 1226gm, AC <3%, nl s/d  Assessment & Plan:  Pt doing well *Pregnancy: routine care. Bid NSTs. Try and CBG checks starting 7-10d s/p bmz since had n/v with GTT *cHTN with severe pre-x: doing well on labetalol 200 bid. Continue with current care. Will get surveillance labs *Preterm: s/p rescue bmz on 4/19 and 4/20. S/p nicu consult *FGR: continue with 2x/week bpps and ua dopplers *Psych: continue zyprexa. Increase to 10 qhs prn -s/p psych consult (signed off) *PPx: scds, oob ad lib *FEN/GI: f/u labs, regular diet, SLIV *Dispo: inpatient until delivery. Hope to get to 34wks.   Jean Copaharlie Korbin Notaro, [name] MD Attending Center for [facility](Faculty Practice)

## 2019-04-12 NOTE — Progress Notes (Signed)
Patient is refusing a new IV (current IV leaking and pt refusing it's use) and fetal monitoring until she sees Dr. Vergie Living. Dr. Vergie Living aware and will come as soon as he is able and this has been communicated to patient.

## 2019-04-12 NOTE — Progress Notes (Signed)
Patient advised nurse and showed in nuns cap tea colored urine with mucousy deposits. Patient asking for enema and stating feeling full. Nurse advised Dr. Vergie Living of this urine as well as patients refusal all day to have fetus monitored. Pickens ordered fluid bolus, fetal monitoring and labs and stated he would come see patient. Nurse advised patient of new orders and asked patient again about getting on the monitor and encouraed her that Dr. Vergie Living would come see her when labs resulted and that until then she should remain on the monitor.

## 2019-04-12 NOTE — Progress Notes (Signed)
Nurse called to bedside for IV leaking.

## 2019-04-12 NOTE — Progress Notes (Signed)
Pt continues to refuse IV placement. Will monitor for readiness.

## 2019-04-13 LAB — COMPREHENSIVE METABOLIC PANEL
ALT: 293 U/L — ABNORMAL HIGH (ref 0–44)
AST: 227 U/L — ABNORMAL HIGH (ref 15–41)
Albumin: 2.9 g/dL — ABNORMAL LOW (ref 3.5–5.0)
Alkaline Phosphatase: 111 U/L (ref 38–126)
Anion gap: 14 (ref 5–15)
BUN: 6 mg/dL (ref 6–20)
CO2: 21 mmol/L — ABNORMAL LOW (ref 22–32)
Calcium: 9.5 mg/dL (ref 8.9–10.3)
Chloride: 101 mmol/L (ref 98–111)
Creatinine, Ser: 0.58 mg/dL (ref 0.44–1.00)
GFR calc Af Amer: >60 mL/min (ref >60)
GFR calc non Af Amer: >60 mL/min (ref >60)
Glucose, Bld: 101 mg/dL — ABNORMAL HIGH (ref 70–99)
Potassium: 2.9 mmol/L — ABNORMAL LOW (ref 3.5–5.1)
Sodium: 136 mmol/L (ref 135–145)
Total Bilirubin: 1.1 mg/dL (ref 0.3–1.2)
Total Protein: 7.3 g/dL (ref 6.5–8.1)

## 2019-04-13 LAB — HEPATITIS PANEL, ACUTE
HCV Ab: <0.1 s/co ratio (ref 0.0–0.9)
Hep A IgM: NEGATIVE
Hep B C IgM: NEGATIVE
Hepatitis B Surface Ag: NEGATIVE

## 2019-04-13 MED ORDER — LABETALOL HCL 200 MG PO TABS
300.0000 mg | ORAL_TABLET | Freq: Two times a day (BID) | ORAL | Status: DC
  Administered 2019-04-13 – 2019-04-17 (×9): 300 mg via ORAL
  Filled 2019-04-13 (×9): qty 1

## 2019-04-13 NOTE — Progress Notes (Signed)
Daily Antepartum Note  Admission Date: 04/06/2019 Current Date: 04/13/2019 7:36 AM  Jean Mata is a 28 y.o. G1 @ 4886w6d, HD#8, admitted for cHTN with superimposed pre-eclampsia with severe features (HTN, neuro, h/o elevated LFTs).  Pregnancy complicated by: Patient Active Problem List   Diagnosis Date Noted  . Bipolar disorder (HCC) 04/08/2019  . Mild bipolar I disorder, most recent episode depressed (HCC)   . Gastroenteritis 04/06/2019  . Chronic hypertension with superimposed severe preeclampsia 03/04/2019  . No prenatal care in current pregnancy 03/04/2019  . Elevated LFTs 03/04/2019  . Non-intractable vomiting 03/04/2019  . Hypokalemia 03/04/2019    Overnight/24hr events:  none  Subjective:  No s/s of pre-eclampsia, decreased FM, labor  Objective:    Current Vital Signs 24h Vital Sign Ranges  T 98.4 F (36.9 C) Temp  Avg: 98.3 F (36.8 C)  Min: 98.1 F (36.7 C)  Max: 98.4 F (36.9 C)  BP (!) 154/95 BP  Min: 124/70  Max: 157/100  HR 95 Pulse  Avg: 96.4  Min: 90  Max: 104  RR 16 Resp  Avg: 17.3  Min: 16  Max: 18  SaO2 100 % Room Air SpO2  Avg: 100 %  Min: 100 %  Max: 100 %       24 Hour I/O Current Shift I/O  Time Ins Outs No intake/output data recorded. No intake/output data recorded.   Patient Vitals for the past 24 hrs:  BP Temp Temp src Pulse Resp SpO2  04/13/19 0422 (!) 154/95 98.4 F (36.9 C) Oral 95 16 100 %  04/12/19 2325 (!) 142/94 98.1 F (36.7 C) Oral 90 17 100 %  04/12/19 2006 (!) 142/85 98.4 F (36.9 C) Oral (!) 104 18 -  04/12/19 1700 (!) 157/100 - - 90 - -  04/12/19 1103 124/70 98.3 F (36.8 C) Oral (!) 103 18 100 %   EFM: 130 baseline, +accels, no decel, mod variability. At 1812 was when patient was getting off the monitor  Toco: quiet  Physical exam: General: Well nourished, well developed female in no acute distress. Abdomen: gravid nttp Cardiovascular: S1, S2 normal, no murmur, rub or gallop, regular rate and rhythm Respiratory:  CTAB Extremities: no clubbing, cyanosis or edema Skin: Warm and dry.   Medications: Current Facility-Administered Medications  Medication Dose Route Frequency Provider Last Rate Last Dose  . bisacodyl (DULCOLAX) suppository 10 mg  10 mg Rectal Once Holley BingPickens, Tagg Eustice, MD      . calcium carbonate (TUMS - dosed in mg elemental calcium) chewable tablet 400 mg of elemental calcium  2 tablet Oral Q4H PRN Anyanwu, Ugonna A, MD   400 mg of elemental calcium at 04/10/19 0226  . docusate sodium (COLACE) capsule 100 mg  100 mg Oral Daily Anyanwu, Ugonna A, MD   100 mg at 04/12/19 1100  . labetalol (NORMODYNE) injection 20 mg  20 mg Intravenous PRN Anyanwu, Ugonna A, MD       And  . labetalol (NORMODYNE) injection 40 mg  40 mg Intravenous PRN Anyanwu, Ugonna A, MD       And  . labetalol (NORMODYNE) injection 80 mg  80 mg Intravenous PRN Anyanwu, Ugonna A, MD       And  . hydrALAZINE (APRESOLINE) injection 10 mg  10 mg Intravenous PRN Anyanwu, Ugonna A, MD      . labetalol (NORMODYNE) tablet 300 mg  300 mg Oral BID [place] BingPickens, Dalia Jollie, MD      . metoCLOPramide (REGLAN) injection 10 mg  10  mg Intravenous Q6H PRN Anyanwu, Jethro Bastos, MD   10 mg at 04/10/19 0137  . OLANZapine (ZYPREXA) tablet 5 mg  5 mg Oral QHS Levie Heritage, DO   5 mg at 04/12/19 2115  . ondansetron (ZOFRAN) injection 4 mg  4 mg Intravenous Q6H PRN Anyanwu, Ugonna A, MD   4 mg at 04/12/19 1706  . ondansetron (ZOFRAN) tablet 4 mg  4 mg Oral Q8H PRN Conan Bowens, MD   4 mg at 04/10/19 1417  . pantoprazole (PROTONIX) EC tablet 40 mg  40 mg Oral BID Anyanwu, Ugonna A, MD   40 mg at 04/12/19 2115  . polyethylene glycol (MIRALAX / GLYCOLAX) packet 17 g  17 g Oral TID Kai Levins, MD   17 g at 04/12/19 2115  . potassium chloride SA (K-DUR) CR tablet 30 mEq  30 mEq Oral BID AC Sherrian Nunnelley, Billey Gosling, MD      . prenatal multivitamin tablet 1 tablet  1 tablet Oral Q1200 Anyanwu, Jethro Bastos, MD   1 tablet at 04/11/19 1103  . promethazine (PHENERGAN)  injection 25 mg  25 mg Intravenous Q6H PRN Anyanwu, Ugonna A, MD   25 mg at 04/11/19 1956  . sodium chloride flush (NS) 0.9 % injection 10-40 mL  10-40 mL Intracatheter PRN Yeadon Bing, MD        Labs:  Recent Labs  Lab 04/07/19 0503 04/08/19 0535 04/12/19 1209  WBC 25.4* 23.5* 17.0*  HGB 9.9* 10.0* 11.6*  HCT 29.6* 29.8* 34.9*  PLT 369 411* 398    Recent Labs  Lab 04/08/19 0535 04/12/19 1209 04/12/19 1714  NA 135 132* 134*  K 3.3* 3.1* 3.3*  CL 105 102 100  CO2 19* 18* 20*  BUN <5* 7 7  CREATININE 0.59 0.60 0.58  CALCIUM 8.3* 9.4 9.4  PROT 6.7 7.1 8.0  BILITOT 0.4 1.0 1.0  ALKPHOS 74 102 114  ALT 13 179* 212*  AST 26 135* 155*  GLUCOSE 136* 128* 100*     Radiology: no new imaging 4/23: ceph, afi 13.5, bpp 8/8, nl s/d 4/20: 13%, 1226gm, AC <3%, nl s/d  Assessment & Plan:  Pt doing well *Pregnancy: routine care. Bid NSTs. Try and CBG checks starting 7-10d s/p bmz since had n/v with GTT *cHTN with severe pre-x: labetalol increased to 300 bid from 200 bid. Continue with current care. Surveillance labs not yet drawn. *Transaminitis: normal liver function tests yesterday. F/u repeats for today. Belly exam benign. -Acute hep panel negative *Preterm: s/p rescue bmz on 4/19 and 4/20. S/p nicu consult *FGR: continue with 2x/week bpps and ua dopplers *Psych: continue zyprexa. Increase to 10 qhs prn -s/p psych consult (signed off) *PPx: scds, oob ad lib *FEN/GI: f/u labs, regular diet, SLIV *Dispo: inpatient until delivery. Hope to get to 34wks.   Cornelia Copa MD Attending Center for [facility])

## 2019-04-14 DIAGNOSIS — Z3A32 32 weeks gestation of pregnancy: Secondary | ICD-10-CM

## 2019-04-14 LAB — COMPREHENSIVE METABOLIC PANEL
ALT: 499 U/L — ABNORMAL HIGH (ref 0–44)
AST: 325 U/L — ABNORMAL HIGH (ref 15–41)
Albumin: 3 g/dL — ABNORMAL LOW (ref 3.5–5.0)
Alkaline Phosphatase: 110 U/L (ref 38–126)
Anion gap: 12 (ref 5–15)
BUN: 5 mg/dL — ABNORMAL LOW (ref 6–20)
CO2: 21 mmol/L — ABNORMAL LOW (ref 22–32)
Calcium: 9.4 mg/dL (ref 8.9–10.3)
Chloride: 101 mmol/L (ref 98–111)
Creatinine, Ser: 0.6 mg/dL (ref 0.44–1.00)
GFR calc Af Amer: >60 mL/min (ref >60)
GFR calc non Af Amer: >60 mL/min (ref >60)
Glucose, Bld: 105 mg/dL — ABNORMAL HIGH (ref 70–99)
Potassium: 3.2 mmol/L — ABNORMAL LOW (ref 3.5–5.1)
Sodium: 134 mmol/L — ABNORMAL LOW (ref 135–145)
Total Bilirubin: 1.4 mg/dL — ABNORMAL HIGH (ref 0.3–1.2)
Total Protein: 6.5 g/dL (ref 6.5–8.1)

## 2019-04-14 LAB — CBC
HCT: 33.5 % — ABNORMAL LOW (ref 36.0–46.0)
Hemoglobin: 11.4 g/dL — ABNORMAL LOW (ref 12.0–15.0)
MCH: 30.4 pg (ref 26.0–34.0)
MCHC: 34 g/dL (ref 30.0–36.0)
MCV: 89.3 fL (ref 80.0–100.0)
Platelets: 343 10*3/uL (ref 150–400)
RBC: 3.75 MIL/uL — ABNORMAL LOW (ref 3.87–5.11)
RDW: 12.8 % (ref 11.5–15.5)
WBC: 16.8 10*3/uL — ABNORMAL HIGH (ref 4.0–10.5)
nRBC: 0 % (ref 0.0–0.2)

## 2019-04-14 MED ORDER — ENSURE ENLIVE PO LIQD
237.0000 mL | Freq: Two times a day (BID) | ORAL | Status: DC
  Administered 2019-04-14 – 2019-04-15 (×2): 237 mL via ORAL
  Filled 2019-04-14 (×3): qty 237

## 2019-04-14 MED ORDER — METOCLOPRAMIDE HCL 10 MG PO TABS
10.0000 mg | ORAL_TABLET | Freq: Three times a day (TID) | ORAL | Status: DC
  Administered 2019-04-14 – 2019-04-15 (×4): 10 mg via ORAL
  Filled 2019-04-14 (×8): qty 1

## 2019-04-14 NOTE — Progress Notes (Signed)
Patient ID: Jean Mata, female   DOB: Dec 07, 1991, 28 y.o.   MRN: [id number] Daily Antepartum Note  Admission Date: 04/06/2019 Current Date: 04/14/2019 9:36 AM  Jean Mata is a 28 y.o. G1 @ [redacted]w[redacted]d, HD#9, admitted for cHTN with superimposed pre-eclampsia with severe features (HTN, neuro, h/o elevated LFTs).  Pregnancy complicated by: Patient Active Problem List   Diagnosis Date Noted  . Bipolar disorder (HCC) 04/08/2019  . Mild bipolar I disorder, most recent episode depressed (HCC)   . Gastroenteritis 04/06/2019  . Chronic hypertension with superimposed severe preeclampsia 03/04/2019  . No prenatal care in current pregnancy 03/04/2019  . Elevated LFTs 03/04/2019  . Non-intractable vomiting 03/04/2019  . Hypokalemia 03/04/2019    Overnight/24hr events:  none  Subjective:  Patient reports feeling well and denies s/s of pre-eclampsia, decreased FM, or labor  Objective:    Current Vital Signs 24h Vital Sign Ranges  T (PT SLEEPING) Temp  Avg: 98.4 F (36.9 C)  Min: 97.8 F (36.6 C)  Max: 98.8 F (37.1 C)  BP (!) 140/93 BP  Min: 127/61  Max: 150/88  HR 96 Pulse  Avg: 108.6  Min: 96  Max: 141  RR 18 Resp  Avg: 18  Min: 18  Max: 18  SaO2 99 % (room air) SpO2  Avg: 99 %  Min: 98 %  Max: 100 %       24 Hour I/O Current Shift I/O  Time Ins Outs No intake/output data recorded. No intake/output data recorded.   Patient Vitals for the past 24 hrs:  BP Temp Temp src Pulse Resp SpO2  04/14/19 0616 (!) 140/93 97.8 F (36.6 C) Oral 96 18 99 %  04/13/19 2315 (!) 130/99 98.1 F (36.7 C) Oral (!) 141 18 98 %  04/13/19 2000 140/80 98.8 F (37.1 C) Oral (!) 101 18 100 %  04/13/19 1138 (!) 150/88 98.7 F (37.1 C) Oral 99 18 99 %  04/13/19 1044 127/61 - - (!) 106 - -   EFM: patient declined monitoring overnight  Physical exam: General: Well nourished, well developed female in no acute distress. Abdomen: gravid nttp Cardiovascular: S1, S2 normal, no murmur, rub or gallop, regular  rate and rhythm Respiratory: CTAB Extremities: no clubbing, cyanosis or edema Skin: Warm and dry.   Medications: Current Facility-Administered Medications  Medication Dose Route Frequency Provider Last Rate Last Dose  . bisacodyl (DULCOLAX) suppository 10 mg  10 mg Rectal Once [place] Bing, MD      . calcium carbonate (TUMS - dosed in mg elemental calcium) chewable tablet 400 mg of elemental calcium  2 tablet Oral Q4H PRN Anyanwu, Ugonna A, MD   400 mg of elemental calcium at 04/10/19 0226  . docusate sodium (COLACE) capsule 100 mg  100 mg Oral Daily Anyanwu, Ugonna A, MD   100 mg at 04/14/19 0921  . labetalol (NORMODYNE) injection 20 mg  20 mg Intravenous PRN Anyanwu, Ugonna A, MD       And  . labetalol (NORMODYNE) injection 40 mg  40 mg Intravenous PRN Anyanwu, Ugonna A, MD       And  . labetalol (NORMODYNE) injection 80 mg  80 mg Intravenous PRN Anyanwu, Ugonna A, MD       And  . hydrALAZINE (APRESOLINE) injection 10 mg  10 mg Intravenous PRN Anyanwu, Ugonna A, MD      . labetalol (NORMODYNE) tablet 300 mg  300 mg Oral BID [place] Bing, MD   300 mg at 04/14/19 0921  .  metoCLOPramide (REGLAN) injection 10 mg  10 mg Intravenous Q6H PRN Anyanwu, Jethro BastosUgonna A, MD   10 mg at 04/10/19 0137  . OLANZapine (ZYPREXA) tablet 5 mg  5 mg Oral QHS Levie HeritageStinson, Jacob J, DO   5 mg at 04/13/19 2242  . ondansetron (ZOFRAN) injection 4 mg  4 mg Intravenous Q6H PRN Anyanwu, Jethro BastosUgonna A, MD   4 mg at 04/13/19 2009  . ondansetron (ZOFRAN) tablet 4 mg  4 mg Oral Q8H PRN Conan Bowensavis, Kelly M, MD   4 mg at 04/10/19 1417  . pantoprazole (PROTONIX) EC tablet 40 mg  40 mg Oral BID Anyanwu, Ugonna A, MD   40 mg at 04/14/19 0921  . polyethylene glycol (MIRALAX / GLYCOLAX) packet 17 g  17 g Oral TID Kai LevinsAC Pickens, Charlie, MD   17 g at 04/14/19 0921  . potassium chloride SA (K-DUR) CR tablet 30 mEq  30 mEq Oral BID AC [place] BingPickens, Charlie, MD   30 mEq at 04/13/19 2009  . prenatal multivitamin tablet 1 tablet  1 tablet Oral Q1200  Anyanwu, Ugonna A, MD   1 tablet at 04/13/19 1400  . promethazine (PHENERGAN) injection 25 mg  25 mg Intravenous Q6H PRN Anyanwu, Ugonna A, MD   25 mg at 04/11/19 1956  . sodium chloride flush (NS) 0.9 % injection 10-40 mL  10-40 mL Intracatheter PRN [place] BingPickens, Charlie, MD        Labs:  Recent Labs  Lab 04/08/19 0535 04/12/19 1209  WBC 23.5* 17.0*  HGB 10.0* 11.6*  HCT 29.8* 34.9*  PLT 411* 398    Recent Labs  Lab 04/12/19 1209 04/12/19 1714 04/13/19 0714  NA 132* 134* 136  K 3.1* 3.3* 2.9*  CL 102 100 101  CO2 18* 20* 21*  BUN 7 7 6   CREATININE 0.60 0.58 0.58  CALCIUM 9.4 9.4 9.5  PROT 7.1 8.0 7.3  BILITOT 1.0 1.0 1.1  ALKPHOS 102 114 111  ALT 179* 212* 293*  AST 135* 155* 227*  GLUCOSE 128* 100* 101*     Radiology: no new imaging 4/23: ceph, afi 13.5, bpp 8/8, nl s/d 4/20: 13%, 1226gm, AC <3%, nl s/d  Assessment & Plan:  Pt doing well *Continue fetal monitoring Patient unable to tolerate 75 g glucola challenge test. Will monitor CBgs starting tomorrow  *cHTN with severe pre-x: continue labetalol at 300 mg bid. *Transaminitis: Exam benign. -Acute hep panel negative *Preterm: s/p rescue bmz on 4/19 and 4/20. S/p nicu consult *FGR: continue with 2x/week bpps and ua dopplers *Psych: continue zyprexa. Increase to 10 qhs prn *PPx: scds, oob ad lib *FEN/GI: f/u labs, regular diet, SLIV *Dispo: inpatient until delivery. Hope to get to 34wks.

## 2019-04-14 NOTE — Progress Notes (Signed)
Initial Nutrition Assessment  DOCUMENTATION CODES:   Obesity unspecified  INTERVENTION:  Currently on F/L diet Ensure Enlive BID ordered NUTRITION DIAGNOSIS:   Increased nutrient needs related to (pregnancy and fetal growth requirements) as evidenced by (32 weeks IUP). GOAL:  Patient will meet greater than or equal to 90% of their needs  MONITOR:  Weight trends  REASON FOR ASSESSMENT:  Antenatal   ASSESSMENT:   32 0/7 weeks, adn with CHTN, Hx of hyperemesis and no PNC. Wt at 8 weeks (11/09/18) 111.5 kg BMI 31.6  No weight gain. Pt with higher Kcal/protein needs due to lack of weight gain/ stature  Diet Order:   Diet Order            Diet full liquid Room service appropriate? Yes; Fluid consistency: Thin  Diet effective now              EDUCATION NEEDS:   No education needs have been identified at this time  Skin:  Skin Assessment: Reviewed RN Assessment   Height:   Ht Readings from Last 1 Encounters:  04/07/19 6\' 2"  (1.88 m)    Weight:   Wt Readings from Last 1 Encounters:  04/07/19 103.6 kg    Ideal Body Weight:   170 lbs  BMI:  Body mass index is 29.34 kg/m.  Estimated Nutritional Needs:   Kcal:  2500-2700  Protein:  110-120 g  Fluid:  2.8 L    Elisabeth Cara M.Odis Luster LDN Neonatal Nutrition Support Specialist/RD III Pager [phone]      Phone [phone]

## 2019-04-15 ENCOUNTER — Inpatient Hospital Stay (HOSPITAL_COMMUNITY): Payer: BLUE CROSS/BLUE SHIELD

## 2019-04-15 DIAGNOSIS — O0933 Supervision of pregnancy with insufficient antenatal care, third trimester: Secondary | ICD-10-CM

## 2019-04-15 DIAGNOSIS — O365930 Maternal care for other known or suspected poor fetal growth, third trimester, not applicable or unspecified: Secondary | ICD-10-CM

## 2019-04-15 DIAGNOSIS — O10013 Pre-existing essential hypertension complicating pregnancy, third trimester: Secondary | ICD-10-CM

## 2019-04-15 DIAGNOSIS — Z3A32 32 weeks gestation of pregnancy: Secondary | ICD-10-CM

## 2019-04-15 DIAGNOSIS — O1493 Unspecified pre-eclampsia, third trimester: Secondary | ICD-10-CM

## 2019-04-15 LAB — COMPREHENSIVE METABOLIC PANEL
ALT: 537 U/L — ABNORMAL HIGH (ref 0–44)
AST: 316 U/L — ABNORMAL HIGH (ref 15–41)
Albumin: 2.9 g/dL — ABNORMAL LOW (ref 3.5–5.0)
Alkaline Phosphatase: 106 U/L (ref 38–126)
Anion gap: 11 (ref 5–15)
BUN: 5 mg/dL — ABNORMAL LOW (ref 6–20)
CO2: 21 mmol/L — ABNORMAL LOW (ref 22–32)
Calcium: 9.1 mg/dL (ref 8.9–10.3)
Chloride: 101 mmol/L (ref 98–111)
Creatinine, Ser: 0.66 mg/dL (ref 0.44–1.00)
GFR calc Af Amer: >60 mL/min (ref >60)
GFR calc non Af Amer: >60 mL/min (ref >60)
Glucose, Bld: 126 mg/dL — ABNORMAL HIGH (ref 70–99)
Potassium: 3.1 mmol/L — ABNORMAL LOW (ref 3.5–5.1)
Sodium: 133 mmol/L — ABNORMAL LOW (ref 135–145)
Total Bilirubin: 1 mg/dL (ref 0.3–1.2)
Total Protein: 7.1 g/dL (ref 6.5–8.1)

## 2019-04-15 LAB — GLUCOSE, CAPILLARY
Glucose-Capillary: 90 mg/dL (ref 70–99)
Glucose-Capillary: 112 mg/dL — ABNORMAL HIGH (ref 70–99)

## 2019-04-15 LAB — CBC
HCT: 33.5 % — ABNORMAL LOW (ref 36.0–46.0)
Hemoglobin: 11.3 g/dL — ABNORMAL LOW (ref 12.0–15.0)
MCH: 30.3 pg (ref 26.0–34.0)
MCHC: 33.7 g/dL (ref 30.0–36.0)
MCV: 89.8 fL (ref 80.0–100.0)
Platelets: 328 10*3/uL (ref 150–400)
RBC: 3.73 MIL/uL — ABNORMAL LOW (ref 3.87–5.11)
RDW: 12.8 % (ref 11.5–15.5)
WBC: 15.2 10*3/uL — ABNORMAL HIGH (ref 4.0–10.5)
nRBC: 0 % (ref 0.0–0.2)

## 2019-04-15 LAB — TYPE AND SCREEN
ABO/RH(D): O POS
Antibody Screen: NEGATIVE

## 2019-04-15 LAB — PROTEIN / CREATININE RATIO, URINE
Creatinine, Urine: 184.12 mg/dL
Protein Creatinine Ratio: 0.13 mg/mg{Cre} (ref 0.00–0.15)
Total Protein, Urine: 24 mg/dL

## 2019-04-15 MED ORDER — LACTATED RINGERS IV SOLN
INTRAVENOUS | Status: DC
  Administered 2019-04-15 – 2019-04-16 (×3): via INTRAVENOUS

## 2019-04-15 MED ORDER — OXYTOCIN 40 UNITS IN NORMAL SALINE INFUSION - SIMPLE MED
1.0000 m[IU]/min | INTRAVENOUS | Status: DC
  Administered 2019-04-15: 2 m[IU]/min via INTRAVENOUS
  Filled 2019-04-15: qty 1000

## 2019-04-15 MED ORDER — MISOPROSTOL 25 MCG QUARTER TABLET
25.0000 ug | ORAL_TABLET | ORAL | Status: DC | PRN
  Administered 2019-04-15: 25 ug via VAGINAL

## 2019-04-15 MED ORDER — MISOPROSTOL 25 MCG QUARTER TABLET
ORAL_TABLET | ORAL | Status: AC
  Filled 2019-04-15: qty 1

## 2019-04-15 MED ORDER — HYDRALAZINE HCL 20 MG/ML IJ SOLN: 5.0000 mg | INTRAMUSCULAR | Status: DC | PRN

## 2019-04-15 MED ORDER — OXYTOCIN BOLUS FROM INFUSION
500.0000 mL | Freq: Once | INTRAVENOUS | Status: AC
  Administered 2019-04-16: 500 mL via INTRAVENOUS

## 2019-04-15 MED ORDER — LABETALOL HCL 5 MG/ML IV SOLN: 20.0000 mg | INTRAVENOUS | Status: DC | PRN

## 2019-04-15 MED ORDER — OXYTOCIN 40 UNITS IN NORMAL SALINE INFUSION - SIMPLE MED
2.5000 [IU]/h | INTRAVENOUS | Status: DC
  Administered 2019-04-16: 13:00:00 2.5 [IU]/h via INTRAVENOUS
  Filled 2019-04-15: qty 1000

## 2019-04-15 MED ORDER — SOD CITRATE-CITRIC ACID 500-334 MG/5ML PO SOLN: 30.0000 mL | ORAL | Status: DC | PRN

## 2019-04-15 MED ORDER — HYDRALAZINE HCL 20 MG/ML IJ SOLN: 10.0000 mg | INTRAMUSCULAR | Status: DC | PRN

## 2019-04-15 MED ORDER — ACETAMINOPHEN 325 MG PO TABS: 650.0000 mg | ORAL_TABLET | ORAL | Status: DC | PRN

## 2019-04-15 MED ORDER — LACTATED RINGERS IV SOLN: 500.0000 mL | INTRAVENOUS | Status: DC | PRN

## 2019-04-15 MED ORDER — MAGNESIUM SULFATE BOLUS VIA INFUSION
4.0000 g | Freq: Once | INTRAVENOUS | Status: AC
  Administered 2019-04-15: 13:00:00 4 g via INTRAVENOUS
  Filled 2019-04-15: qty 500

## 2019-04-15 MED ORDER — PENICILLIN G 3 MILLION UNITS IVPB - SIMPLE MED
3.0000 10*6.[IU] | INTRAVENOUS | Status: DC
  Administered 2019-04-15 – 2019-04-16 (×5): 3 10*6.[IU] via INTRAVENOUS
  Filled 2019-04-15 (×10): qty 100

## 2019-04-15 MED ORDER — MAGNESIUM SULFATE 40 G IN LACTATED RINGERS - SIMPLE
2.0000 g/h | INTRAVENOUS | Status: DC
  Administered 2019-04-15 – 2019-04-16 (×2): 2 g/h via INTRAVENOUS
  Filled 2019-04-15 (×2): qty 500

## 2019-04-15 MED ORDER — LIDOCAINE HCL (PF) 1 % IJ SOLN
30.0000 mL | INTRAMUSCULAR | Status: AC | PRN
  Administered 2019-04-16: 30 mL via SUBCUTANEOUS
  Filled 2019-04-15: qty 30

## 2019-04-15 MED ORDER — OXYCODONE-ACETAMINOPHEN 5-325 MG PO TABS: 2.0000 | ORAL_TABLET | ORAL | Status: DC | PRN

## 2019-04-15 MED ORDER — LABETALOL HCL 5 MG/ML IV SOLN: 40.0000 mg | INTRAVENOUS | Status: DC | PRN

## 2019-04-15 MED ORDER — SODIUM CHLORIDE 0.9 % IV SOLN
5.0000 10*6.[IU] | Freq: Once | INTRAVENOUS | Status: AC
  Administered 2019-04-15: 14:00:00 5 10*6.[IU] via INTRAVENOUS
  Filled 2019-04-15: qty 5

## 2019-04-15 MED ORDER — TERBUTALINE SULFATE 1 MG/ML IJ SOLN: 0.2500 mg | Freq: Once | INTRAMUSCULAR | Status: DC | PRN

## 2019-04-15 MED ORDER — OXYCODONE-ACETAMINOPHEN 5-325 MG PO TABS: 1.0000 | ORAL_TABLET | ORAL | Status: DC | PRN

## 2019-04-15 MED ORDER — ONDANSETRON HCL 4 MG PO TABS: 4.0000 mg | ORAL_TABLET | Freq: Three times a day (TID) | ORAL | Status: DC | PRN

## 2019-04-15 MED ORDER — ONDANSETRON HCL 4 MG/2ML IJ SOLN: 4.0000 mg | Freq: Four times a day (QID) | INTRAMUSCULAR | Status: DC | PRN

## 2019-04-15 NOTE — Progress Notes (Signed)
OB/GYN Faculty Practice: Labor Progress Note  Subjective: Comfortable, feeling occasional cramping. FB came out.   Objective: BP 112/71   Pulse 88   Temp 97.9 F (36.6 C) (Oral)   Resp 20   Ht 6\' 2"  (1.88 m)   Wt 103.6 kg   LMP 10/11/2018 (Approximate)   SpO2 100%   BMI 29.32 kg/m  Gen: well-appearing, NAD Dilation: 4 Effacement (%): 50 Cervical Position: Anterior Station: -2 Presentation: Vertex Exam by:: Dr L. Wallace  Assessment and Plan: 28 y.o. G1P0 [redacted]w[redacted]d admitted to antepartum 4/19 and now to start induction today for worsening severe preeclampsia. Pregnancy complicated by cHTN, bipolar disorder.  Labor: FB now out. Would likely benefit from additional dose of cytotec but given fetal tracing without many accelerations and possible variables at times, will switch to pitocin to allow for titration.  -- pain control: desires epidural but wants to wait to see how handles pain -- PPH Risk: medium (BMI, Mg++, presumed longer induction)   Fetal Well-Being: EFW 1226g (13%), AC<3% concerning for fetal growth restriction at 31w0. Cephalic by sutures, recent U/S.  -- Category I - continuous fetal monitoring  -- GBS unknown - will obtain culture then start PCN -- BMZ 3/17-18 and 4/19-20   Superimposed Preeclampsia with Severe Features: BP moderate range. Asymptomatic.  -- Mg++ -- daily labs -- labetalol 300mg  BID + hyralazine protocol prn   Laurel S. Earlene Plater, DO OB/GYN Fellow, Faculty Practice  5:08 PM

## 2019-04-15 NOTE — Progress Notes (Addendum)
OB/GYN Faculty Practice: Labor Progress Note  Subjective: Patient brought up to L&D for induction from [facility] specialty care because of worsening severe preeclampsia. Patient denies contractions. Denies headaches, vision changes, abdominal pain.   Objective: BP 122/75 (BP Location: Right Arm)   Pulse (!) 103   Temp 97.9 F (36.6 C) (Oral)   Resp 16   Ht 6\' 2"  (1.88 m)   Wt 103.6 kg   LMP 10/11/2018 (Approximate)   SpO2 100%   BMI 29.32 kg/m  Gen: well-appearing, NAD Dilation: 1.5 Effacement (%): 50 Cervical Position: Anterior Station: -2 Presentation: Vertex Exam by:: Dr Earlene Plater  Assessment and Plan: 28 y.o. G1P0 [redacted]w[redacted]d admitted to antepartum 4/19 and now to start induction today for worsening severe preeclampsia. Pregnancy complicated by cHTN, bipolar disorder.  Labor: Induction to start with FB, cytotec. Counseled patient regarding procedure for FB placement, patient in agreement. FB placed with 60cc of fluid, patient tolerated well. Will also start cytotec for cervical ripening.  -- pain control: desires epidural but wants to wait to see how handles pain -- PPH Risk: medium (BMI, Mg++, presumed longer induction)   Fetal Well-Being: EFW 1226g (13%), AC<3% concerning for fetal growth restriction at 31w0. Cephalic by sutures, recent U/S.  -- Category I - continuous fetal monitoring  -- GBS unknown - will obtain culture then start PCN -- BMZ 3/17-18 and 4/19-20   Superimposed Preeclampsia with Severe Features: Worsening SF by LFTs (AST/ALT 316/537) and has also had severe range pressures this admission necessitating IV meds. UPC 0.13. Platelets stable 328 though have been down-trending over last few days. Had work-up for LFTs about a month ago - thought at that time to be possible drug-induced liver injury from Keflex or even autoimmune hepatitis. Patient also on Zyprexa which could be contributing to elevated LFTs. Given worsening trend and persistently elevated BP, thought to mostly be  related to preeclampsia at this time. -- induction as above -- Mg++ -- daily labs -- labetalol 300mg  BID + hyralazine protocol prn    S. Earlene Plater, DO OB/GYN Fellow, Faculty Practice  1:21 PM

## 2019-04-15 NOTE — Plan of Care (Addendum)
0127 - MD notified, pt on 24 milliunits/min of pitocin; feeling cramping and pressure but not contractions, currently trying to sleep.  MD requests to hold at 24 of pit and recheck cervix at 0600.  Pt resting comfortably in bed.  Not feeling any contractions, just "some cramping and pressure".  Unable to trace contractions d/t pt habitus and position, MD made aware and requested that RN continue to increase pitocin as long as FHR reassuring and pt not feeling ctx. Pt asked about why the provider d/c'd her mood stabilizer, MD made aware and came to speak with pt regarding potential side effect of elevated liver enzymes.  Educated pt on labor process and answered all questions.  Will continue to monitor and communicate with team as needed.   Problem: Health Behavior/Discharge Planning: Goal: Ability to manage health-related needs will improve Outcome: Progressing   Problem: Clinical Measurements: Goal: Ability to maintain clinical measurements within normal limits will improve 04/15/2019 2255 by Clare Charon, RN Outcome: Progressing 04/15/2019 2254 by Clare Charon, RN Reactivated Goal: Will remain free from infection 04/15/2019 2255 by Clare Charon, RN Outcome: Progressing 04/15/2019 2254 by Clare Charon, RN Reactivated Goal: Diagnostic test results will improve 04/15/2019 2255 by Clare Charon, RN Outcome: Progressing 04/15/2019 2254 by Clare Charon, RN Reactivated Goal: Respiratory complications will improve 04/15/2019 2255 by Clare Charon, RN Outcome: Progressing 04/15/2019 2254 by Clare Charon, RN Reactivated Goal: Cardiovascular complication will be avoided 04/15/2019 2255 by Clare Charon, RN Outcome: Progressing 04/15/2019 2254 by Clare Charon, RN Reactivated   Problem: Activity: Goal: Risk for activity intolerance will decrease 04/15/2019 2255 by Clare Charon, RN Outcome: Progressing 04/15/2019 2254 by Clare Charon, RN Reactivated    Problem: Nutrition: Goal: Adequate nutrition will be maintained 04/15/2019 2255 by Clare Charon, RN Outcome: Progressing 04/15/2019 2254 by Clare Charon, RN Reactivated   Problem: Coping: Goal: Level of anxiety will decrease 04/15/2019 2255 by Clare Charon, RN Outcome: Progressing 04/15/2019 2254 by Clare Charon, RN Reactivated   Problem: Education: Goal: Knowledge of disease or condition will improve 04/15/2019 2255 by Clare Charon, RN Outcome: Progressing 04/15/2019 2254 by Clare Charon, RN Reactivated Goal: Knowledge of the prescribed therapeutic regimen will improve 04/15/2019 2255 by Clare Charon, RN Outcome: Progressing 04/15/2019 2254 by Clare Charon, RN Reactivated   Problem: Fluid Volume: Goal: Peripheral tissue perfusion will improve 04/15/2019 2255 by Clare Charon, RN Outcome: Progressing 04/15/2019 2254 by Clare Charon, RN Reactivated   Problem: Clinical Measurements: Goal: Complications related to disease process, condition or treatment will be avoided or minimized 04/15/2019 2255 by Clare Charon, RN Outcome: Progressing 04/15/2019 2254 by Clare Charon, RN Reactivated   Problem: Education: Goal: Knowledge of Childbirth will improve Outcome: Progressing Goal: Ability to make informed decisions regarding treatment and plan of care will improve Outcome: Progressing Goal: Ability to state and carry out methods to decrease the pain will improve Outcome: Progressing Goal: Individualized Educational Video(s) Outcome: Progressing   Problem: Coping: Goal: Ability to verbalize concerns and feelings about labor and delivery will improve Outcome: Progressing   Problem: Life Cycle: Goal: Ability to make normal progression through stages of labor will improve Outcome: Progressing Goal: Ability to effectively push during vaginal delivery will improve Outcome: Progressing   Problem: Role Relationship: Goal: Will  demonstrate positive interactions with the child Outcome: Progressing   Problem: Safety: Goal: Risk of complications during labor and delivery will decrease Outcome: Progressing   Problem: Pain Management: Goal: Relief or control of pain from uterine contractions will improve Outcome: Progressing

## 2019-04-15 NOTE — Progress Notes (Signed)
Report called to Herma Carson, RN on L&D.  Pt transferred via wheelchair by B. Mayford Knife, Charity fundraiser.

## 2019-04-15 NOTE — Progress Notes (Signed)
Pt reported to this RN that she was noncompliant with full liquid diet. She stated she "wasn't doing that again" and would bring in food from home. Pt also stated that she "threw her potassium pills away" during her last admission when she doesn't want to take them.

## 2019-04-15 NOTE — Progress Notes (Signed)
LABOR PROGRESS NOTE  Jean Mata is a 28 y.o. G1P0 at [redacted]w[redacted]d admitted for SIPE w SF  Subjective: Feeling fine Having some pelvic pressure, but no pain with contractions Denies HA, vision changes, RUQ/epigastric pain   Objective: BP 128/82   Pulse 94   Temp 98.3 F (36.8 C) (Oral)   Resp 17   Ht 6\' 2"  (1.88 m)   Wt 103.6 kg   LMP 10/11/2018 (Approximate)   SpO2 100%   BMI 29.32 kg/m  or  Vitals:   04/15/19 1929 04/15/19 2032 04/15/19 2142 04/15/19 2201  BP:  117/70 134/86 128/82  Pulse:  95 98 94  Resp: 18 17    Temp: 98.3 F (36.8 C)     TempSrc: Oral     SpO2:      Weight:      Height:        Dilation: 4 Effacement (%): 50 Cervical Position: Anterior Station: -2 Presentation: Vertex Exam by:: K.Lancaster, RN FHT: baseline rate 140, moderate varibility, occasional acels, no decel Toco: q2-58m (difficult to trace)  Labs: Lab Results  Component Value Date   WBC 15.2 (H) 04/15/2019   HGB 11.3 (L) 04/15/2019   HCT 33.5 (L) 04/15/2019   MCV 89.8 04/15/2019   PLT 328 04/15/2019    Patient Active Problem List   Diagnosis Date Noted  . Bipolar disorder (HCC) 04/08/2019  . Mild bipolar I disorder, most recent episode depressed (HCC)   . Gastroenteritis 04/06/2019  . Chronic hypertension with superimposed severe preeclampsia 03/04/2019  . No prenatal care in current pregnancy 03/04/2019  . Elevated LFTs 03/04/2019  . Non-intractable vomiting 03/04/2019  . Hypokalemia 03/04/2019    Assessment / Plan: 28 y.o. G1P0 at [redacted]w[redacted]d here for IOL for SIPE w SF  Labor: continue pitocin. Patient still not feeling much pain with contractions  Fetal Wellbeing:  Cat 1 Pain Control:  Comfortable  Anticipated MOD:  SVD  [name],MD OB Fellow  04/15/2019, 10:45 PM

## 2019-04-15 NOTE — Progress Notes (Signed)
Patient ID: Jean Mata, female   DOB: [birth year], 28 y.o.   MRN: [id number] Daily Antepartum Note  Admission Date: 04/06/2019 Current Date: 04/15/2019 11:42 AM  Jean Mata is a 28 y.o. G1 @ 6135w1d, HD#10, admitted for cHTN with superimposed pre-eclampsia with severe features (HTN, neuro, h/o elevated LFTs).  Pregnancy complicated by: Patient Active Problem List   Diagnosis Date Noted  . Bipolar disorder (HCC) 04/08/2019  . Mild bipolar I disorder, most recent episode depressed (HCC)   . Gastroenteritis 04/06/2019  . Chronic hypertension with superimposed severe preeclampsia 03/04/2019  . No prenatal care in current pregnancy 03/04/2019  . Elevated LFTs 03/04/2019  . Non-intractable vomiting 03/04/2019  . Hypokalemia 03/04/2019    Overnight/24hr events:  none  Subjective:  Patient reports feeling well and denies s/s of pre-eclampsia, decreased FM, or labor. She admits to not being compliant with the full diet and reports one episode of emesis yesterday  Objective:    Current Vital Signs 24h Vital Sign Ranges  T 97.9 F (36.6 C) Temp  Avg: 98.1 F (36.7 C)  Min: 97.7 F (36.5 C)  Max: 98.7 F (37.1 C)  BP 130/84 BP  Min: 129/79  Max: 142/99  HR 93 Pulse  Avg: 100.2  Min: 92  Max: 114  RR 18 Resp  Avg: 18.3  Min: 18  Max: 19  SaO2 99 % Room Air SpO2  Avg: 98.8 %  Min: 97 %  Max: 100 %       24 Hour I/O Current Shift I/O  Time Ins Outs No intake/output data recorded. No intake/output data recorded.   Patient Vitals for the past 24 hrs:  BP Temp Temp src Pulse Resp SpO2  04/15/19 0725 130/84 97.9 F (36.6 C) Oral 93 18 99 %  04/15/19 0621 (!) 142/99 98.3 F (36.8 C) Oral 92 19 98 %  04/14/19 2250 134/79 98 F (36.7 C) Oral (!) 114 19 -  04/14/19 1940 129/79 98.7 F (37.1 C) Oral 97 18 100 %  04/14/19 1525 132/83 98 F (36.7 C) Oral (!) 109 18 100 %  04/14/19 1158 (!) 133/92 97.7 F (36.5 C) Oral 96 18 97 %   EFM: baseline 155, mod variability, +10x10 accels, no  decels  Physical exam: General: Well nourished, well developed female in no acute distress. Abdomen: gravid nontender Cardiovascular: S1, S2 normal, no murmur, rub or gallop, regular rate and rhythm Respiratory: CTAB Extremities: no clubbing, cyanosis or edema Skin: Warm and dry.   Medications: Current Facility-Administered Medications  Medication Dose Route Frequency Provider Last Rate Last Dose  . bisacodyl (DULCOLAX) suppository 10 mg  10 mg Rectal Once [place] BingPickens, Charlie, MD      . calcium carbonate (TUMS - dosed in mg elemental calcium) chewable tablet 400 mg of elemental calcium  2 tablet Oral Q4H PRN Anyanwu, Ugonna A, MD   400 mg of elemental calcium at 04/10/19 0226  . docusate sodium (COLACE) capsule 100 mg  100 mg Oral Daily Anyanwu, Ugonna A, MD   100 mg at 04/14/19 0921  . feeding supplement (ENSURE ENLIVE) (ENSURE ENLIVE) liquid 237 mL  237 mL Oral BID BM Lasheka Kempner, MD   237 mL at 04/15/19 1108  . labetalol (NORMODYNE) injection 20 mg  20 mg Intravenous PRN Anyanwu, Ugonna A, MD       And  . labetalol (NORMODYNE) injection 40 mg  40 mg Intravenous PRN Anyanwu, Jethro BastosUgonna A, MD       And  . labetalol (NORMODYNE)  injection 80 mg  80 mg Intravenous PRN Anyanwu, Ugonna A, MD       And  . hydrALAZINE (APRESOLINE) injection 10 mg  10 mg Intravenous PRN Anyanwu, Ugonna A, MD      . labetalol (NORMODYNE) tablet 300 mg  300 mg Oral BID [place] Bing, MD   300 mg at 04/15/19 1107  . metoCLOPramide (REGLAN) tablet 10 mg  10 mg Oral TID PC & HS Moncia Annas, MD   10 mg at 04/15/19 0835  . OLANZapine (ZYPREXA) tablet 5 mg  5 mg Oral QHS Levie Heritage, DO   5 mg at 04/14/19 2244  . ondansetron (ZOFRAN) injection 4 mg  4 mg Intravenous Q6H PRN Anyanwu, Jethro Bastos, MD   4 mg at 04/13/19 2009  . ondansetron (ZOFRAN) tablet 4 mg  4 mg Oral Q8H PRN Conan Bowens, MD   4 mg at 04/10/19 1417  . pantoprazole (PROTONIX) EC tablet 40 mg  40 mg Oral BID Anyanwu, Ugonna A, MD   40 mg at  04/15/19 1108  . polyethylene glycol (MIRALAX / GLYCOLAX) packet 17 g  17 g Oral TID AC [place] Bing, MD   17 g at 04/15/19 0835  . potassium chloride SA (K-DUR) CR tablet 30 mEq  30 mEq Oral BID AC [place] Bing, MD   30 mEq at 04/14/19 1802  . prenatal multivitamin tablet 1 tablet  1 tablet Oral Q1200 Anyanwu, Ugonna A, MD   1 tablet at 04/13/19 1400  . promethazine (PHENERGAN) injection 25 mg  25 mg Intravenous Q6H PRN Anyanwu, Ugonna A, MD   25 mg at 04/11/19 1956  . sodium chloride flush (NS) 0.9 % injection 10-40 mL  10-40 mL Intracatheter PRN [place] Bing, MD        Labs:  Recent Labs  Lab 04/12/19 1209 04/14/19 1000 04/15/19 0849  WBC 17.0* 16.8* 15.2*  HGB 11.6* 11.4* 11.3*  HCT 34.9* 33.5* 33.5*  PLT 398 343 328    Recent Labs  Lab 04/13/19 0714 04/14/19 1000 04/15/19 0849  NA 136 134* 133*  K 2.9* 3.2* 3.1*  CL 101 101 101  CO2 21* 21* 21*  BUN 6 5* 5*  CREATININE 0.58 0.60 0.66  CALCIUM 9.5 9.4 9.1  PROT 7.3 6.5 7.1  BILITOT 1.1 1.4* 1.0  ALKPHOS 111 110 106  ALT 293* 499* 537*  AST 227* 325* 316*  GLUCOSE 101* 105* 126*     Radiology: no new imaging 4/28: cephalic with BPP 8/8 4/23: ceph, afi 13.5, bpp 8/8, nl s/d 4/20: 13%, 1226gm, AC <3%, nl s/d  Assessment & Plan:  Patient at [redacted]w[redacted]d with CHTN with superimposed severe preeclampsia - Patient with persistently elevated LFTs - Patient had a previous negative GI workup - Given timing of elevated BP associated with an increase in labetalol dosage and development of transaminitis, these findings are concerning for worsening severe preeclampsia - Case discussed with MFM, Dr. Grace Bushy, who agrees with plan to proceed with delivery - Patient informed of plan of care. I spoke with her mother over the phone. All questions were answered - Patient to be transferred to labor and delivery

## 2019-04-16 ENCOUNTER — Inpatient Hospital Stay (HOSPITAL_COMMUNITY): Payer: BLUE CROSS/BLUE SHIELD | Admitting: Anesthesiology

## 2019-04-16 DIAGNOSIS — Z3A32 32 weeks gestation of pregnancy: Secondary | ICD-10-CM

## 2019-04-16 DIAGNOSIS — O1414 Severe pre-eclampsia complicating childbirth: Secondary | ICD-10-CM

## 2019-04-16 LAB — COMPREHENSIVE METABOLIC PANEL
ALT: 518 U/L — ABNORMAL HIGH (ref 0–44)
AST: 263 U/L — ABNORMAL HIGH (ref 15–41)
Albumin: 2.8 g/dL — ABNORMAL LOW (ref 3.5–5.0)
Alkaline Phosphatase: 111 U/L (ref 38–126)
Anion gap: 10 (ref 5–15)
BUN: <5 mg/dL — ABNORMAL LOW (ref 6–20)
CO2: 22 mmol/L (ref 22–32)
Calcium: 8.1 mg/dL — ABNORMAL LOW (ref 8.9–10.3)
Chloride: 104 mmol/L (ref 98–111)
Creatinine, Ser: 0.59 mg/dL (ref 0.44–1.00)
GFR calc Af Amer: >60 mL/min (ref >60)
GFR calc non Af Amer: >60 mL/min (ref >60)
Glucose, Bld: 102 mg/dL — ABNORMAL HIGH (ref 70–99)
Potassium: 3.3 mmol/L — ABNORMAL LOW (ref 3.5–5.1)
Sodium: 136 mmol/L (ref 135–145)
Total Bilirubin: 0.5 mg/dL (ref 0.3–1.2)
Total Protein: 6.8 g/dL (ref 6.5–8.1)

## 2019-04-16 LAB — CBC
HCT: 33.6 % — ABNORMAL LOW (ref 36.0–46.0)
HCT: 37.1 % (ref 36.0–46.0)
HCT: 30.2 % — ABNORMAL LOW (ref 36.0–46.0)
Hemoglobin: 11.2 g/dL — ABNORMAL LOW (ref 12.0–15.0)
Hemoglobin: 12.6 g/dL (ref 12.0–15.0)
Hemoglobin: 10 g/dL — ABNORMAL LOW (ref 12.0–15.0)
MCH: 30.3 pg (ref 26.0–34.0)
MCH: 30.4 pg (ref 26.0–34.0)
MCH: 30.1 pg (ref 26.0–34.0)
MCHC: 33.3 g/dL (ref 30.0–36.0)
MCHC: 34 g/dL (ref 30.0–36.0)
MCHC: 33.1 g/dL (ref 30.0–36.0)
MCV: 90.8 fL (ref 80.0–100.0)
MCV: 89.4 fL (ref 80.0–100.0)
MCV: 91 fL (ref 80.0–100.0)
Platelets: 304 10*3/uL (ref 150–400)
Platelets: 329 10*3/uL (ref 150–400)
Platelets: 270 10*3/uL (ref 150–400)
RBC: 3.7 MIL/uL — ABNORMAL LOW (ref 3.87–5.11)
RBC: 4.15 MIL/uL (ref 3.87–5.11)
RBC: 3.32 MIL/uL — ABNORMAL LOW (ref 3.87–5.11)
RDW: 12.7 % (ref 11.5–15.5)
RDW: 12.7 % (ref 11.5–15.5)
RDW: 12.6 % (ref 11.5–15.5)
WBC: 19.1 10*3/uL — ABNORMAL HIGH (ref 4.0–10.5)
WBC: 22.7 10*3/uL — ABNORMAL HIGH (ref 4.0–10.5)
WBC: 17.3 10*3/uL — ABNORMAL HIGH (ref 4.0–10.5)
nRBC: 0 % (ref 0.0–0.2)
nRBC: 0 % (ref 0.0–0.2)
nRBC: 0 % (ref 0.0–0.2)

## 2019-04-16 LAB — MAGNESIUM: Magnesium: 4.7 mg/dL — ABNORMAL HIGH (ref 1.7–2.4)

## 2019-04-16 LAB — RPR: RPR Ser Ql: NONREACTIVE

## 2019-04-16 MED ORDER — SIMETHICONE 80 MG PO CHEW: 80.0000 mg | CHEWABLE_TABLET | ORAL | Status: DC | PRN

## 2019-04-16 MED ORDER — SODIUM CHLORIDE (PF) 0.9 % IJ SOLN
INTRAMUSCULAR | Status: DC | PRN
  Administered 2019-04-16: 14 mL/h via EPIDURAL

## 2019-04-16 MED ORDER — COCONUT OIL OIL
1.0000 "application " | TOPICAL_OIL | Status: DC | PRN
  Administered 2019-04-16: 1 via TOPICAL

## 2019-04-16 MED ORDER — MAGNESIUM SULFATE 40 G IN LACTATED RINGERS - SIMPLE
2.0000 g/h | INTRAVENOUS | Status: AC
  Administered 2019-04-17: 2 g/h via INTRAVENOUS
  Filled 2019-04-16: qty 500

## 2019-04-16 MED ORDER — DIPHENHYDRAMINE HCL 50 MG/ML IJ SOLN: 12.5000 mg | INTRAMUSCULAR | Status: DC | PRN

## 2019-04-16 MED ORDER — EPHEDRINE 5 MG/ML INJ
10.0000 mg | INTRAVENOUS | Status: DC | PRN
  Filled 2019-04-16: qty 2

## 2019-04-16 MED ORDER — DOCUSATE SODIUM 100 MG PO CAPS
100.0000 mg | ORAL_CAPSULE | Freq: Two times a day (BID) | ORAL | Status: DC
  Administered 2019-04-16 – 2019-04-17 (×2): 100 mg via ORAL
  Filled 2019-04-16 (×4): qty 1

## 2019-04-16 MED ORDER — ZOLPIDEM TARTRATE 5 MG PO TABS: 5.0000 mg | ORAL_TABLET | Freq: Every evening | ORAL | Status: DC | PRN

## 2019-04-16 MED ORDER — TETANUS-DIPHTH-ACELL PERTUSSIS 5-2.5-18.5 LF-MCG/0.5 IM SUSP: 0.5000 mL | Freq: Once | INTRAMUSCULAR | Status: DC

## 2019-04-16 MED ORDER — OXYCODONE HCL 5 MG PO TABS: 10.0000 mg | ORAL_TABLET | ORAL | Status: DC | PRN

## 2019-04-16 MED ORDER — LIDOCAINE HCL (PF) 1 % IJ SOLN
INTRAMUSCULAR | Status: DC | PRN
  Administered 2019-04-16: 10 mL via EPIDURAL

## 2019-04-16 MED ORDER — WITCH HAZEL-GLYCERIN EX PADS: 1.0000 "application " | MEDICATED_PAD | CUTANEOUS | Status: DC | PRN

## 2019-04-16 MED ORDER — DIBUCAINE (PERIANAL) 1 % EX OINT: 1.0000 "application " | TOPICAL_OINTMENT | CUTANEOUS | Status: DC | PRN

## 2019-04-16 MED ORDER — LACTATED RINGERS IV SOLN
500.0000 mL | Freq: Once | INTRAVENOUS | Status: AC
  Administered 2019-04-16: 11:00:00 500 mL via INTRAVENOUS

## 2019-04-16 MED ORDER — FENTANYL-BUPIVACAINE-NACL 0.5-0.125-0.9 MG/250ML-% EP SOLN
12.0000 mL/h | EPIDURAL | Status: DC | PRN
  Filled 2019-04-16: qty 250

## 2019-04-16 MED ORDER — PRENATAL MULTIVITAMIN CH
1.0000 | ORAL_TABLET | Freq: Every day | ORAL | Status: DC
  Administered 2019-04-17: 1 via ORAL
  Filled 2019-04-16: qty 1

## 2019-04-16 MED ORDER — DIPHENHYDRAMINE HCL 25 MG PO CAPS: 25.0000 mg | ORAL_CAPSULE | Freq: Four times a day (QID) | ORAL | Status: DC | PRN

## 2019-04-16 MED ORDER — ONDANSETRON HCL 4 MG PO TABS: 4.0000 mg | ORAL_TABLET | ORAL | Status: DC | PRN

## 2019-04-16 MED ORDER — PHENYLEPHRINE 40 MCG/ML (10ML) SYRINGE FOR IV PUSH (FOR BLOOD PRESSURE SUPPORT)
80.0000 ug | PREFILLED_SYRINGE | INTRAVENOUS | Status: DC | PRN
  Filled 2019-04-16: qty 10

## 2019-04-16 MED ORDER — PHENYLEPHRINE 40 MCG/ML (10ML) SYRINGE FOR IV PUSH (FOR BLOOD PRESSURE SUPPORT)
80.0000 ug | PREFILLED_SYRINGE | INTRAVENOUS | Status: DC | PRN
  Filled 2019-04-16 (×2): qty 10

## 2019-04-16 MED ORDER — LACTATED RINGERS IV SOLN
INTRAVENOUS | Status: DC
  Administered 2019-04-16: 100 mL/h via INTRAVENOUS
  Administered 2019-04-17: 10:00:00 via INTRAVENOUS

## 2019-04-16 MED ORDER — BENZOCAINE-MENTHOL 20-0.5 % EX AERO
1.0000 "application " | INHALATION_SPRAY | CUTANEOUS | Status: DC | PRN
  Administered 2019-04-16: 1 via TOPICAL
  Filled 2019-04-16: qty 56

## 2019-04-16 MED ORDER — OXYCODONE HCL 5 MG PO TABS: 5.0000 mg | ORAL_TABLET | ORAL | Status: DC | PRN

## 2019-04-16 MED ORDER — ONDANSETRON HCL 4 MG/2ML IJ SOLN: 4.0000 mg | INTRAMUSCULAR | Status: DC | PRN

## 2019-04-16 MED ORDER — IBUPROFEN 600 MG PO TABS
600.0000 mg | ORAL_TABLET | Freq: Four times a day (QID) | ORAL | Status: DC
  Administered 2019-04-16 – 2019-04-18 (×7): 600 mg via ORAL
  Filled 2019-04-16 (×7): qty 1

## 2019-04-16 NOTE — Anesthesia Preprocedure Evaluation (Addendum)
Anesthesia Evaluation  Patient identified by MRN, date of birth, ID band Patient awake    Reviewed: Allergy & Precautions, H&P , NPO status , Patient's Chart, lab work & pertinent test results  History of Anesthesia Complications Negative for: history of anesthetic complications  Airway Mallampati: II  TM Distance: >3 FB Neck ROM: full    Dental no notable dental hx.  Braces:   Pulmonary neg pulmonary ROS, former smoker,    Pulmonary exam normal        Cardiovascular hypertension, Normal cardiovascular exam Rhythm:regular Rate:Normal     Neuro/Psych PSYCHIATRIC DISORDERS Bipolar Disorder negative neurological ROS     GI/Hepatic negative GI ROS, Neg liver ROS,   Endo/Other  negative endocrine ROS  Renal/GU negative Renal ROS  negative genitourinary   Musculoskeletal   Abdominal   Peds  Hematology negative hematology ROS (+)   Anesthesia Other Findings Pre eclampsia with severe symptoms and elevated LFTs. Platelet count drawn just prior to epidural placement - 329.  Reproductive/Obstetrics (+) Pregnancy                             Anesthesia Physical Anesthesia Plan  ASA: III  Anesthesia Plan: Epidural   Post-op Pain Management:    Induction:   PONV Risk Score and Plan:   Airway Management Planned:   Additional Equipment:   Intra-op Plan:   Post-operative Plan:   Informed Consent: I have reviewed the patients History and Physical, chart, labs and discussed the procedure including the risks, benefits and alternatives for the proposed anesthesia with the patient or authorized representative who has indicated his/her understanding and acceptance.       Plan Discussed with:   Anesthesia Plan Comments:        Anesthesia Quick Evaluation

## 2019-04-16 NOTE — Anesthesia Procedure Notes (Signed)
Epidural Patient location during procedure: OB Start time: 04/16/2019 11:12 AM End time: 04/16/2019 11:28 AM  Staffing Anesthesiologist: Lucretia Kern, MD Performed: anesthesiologist   Preanesthetic Checklist Completed: patient identified, pre-op evaluation, timeout performed, IV checked, risks and benefits discussed and monitors and equipment checked  Epidural Patient position: sitting Prep: DuraPrep Patient monitoring: heart rate, continuous pulse ox and blood pressure Approach: midline Location: L3-L4 Injection technique: LOR air  Needle:  Needle type: Tuohy  Needle gauge: 17 G Needle length: 9 cm Needle insertion depth: 8 cm Catheter type: closed end flexible Catheter size: 19 Gauge Catheter at skin depth: 13 cm Test dose: negative  Assessment Events: blood not aspirated, injection not painful, no injection resistance, negative IV test and no paresthesia  Additional Notes Reason for block:procedure for pain

## 2019-04-16 NOTE — Progress Notes (Signed)
OB/GYN Faculty Practice: Labor Progress Note  Subjective: Doing well, comfortable, feeling occasional pressure in pelvis but no contractions.   Objective: BP (!) 130/99   Pulse 87   Temp 97.7 F (36.5 C) (Oral)   Resp 16   Ht 6\' 2"  (1.88 m)   Wt 103.6 kg   LMP 10/11/2018 (Approximate)   SpO2 100%   BMI 29.32 kg/m  Gen: well-appearing, NAD Dilation: 4.5 Effacement (%): 50 Cervical Position: Anterior Station: -2 Presentation: Vertex Exam by:: Dr. Earlene Plater  Assessment and Plan: 28 y.o. G1P0 [redacted]w[redacted]d admitted to antepartum 4/19, started induction 4/28 for severe preeclampsia. Pregnancy complicated by cHTN, bipolar disorder.  Labor: Cervical exam essentially unchanged. Discussed risks/benefits of AROM with patient who was amenable to procedure. AROM clear fluid, no complications, FHR stable after procedure. Will continue pitocin and start to titrate as able. Plan to recheck in about 4 hours.  -- pain control: desires epidural but wants to wait to see how handles pain -- PPH Risk: medium (BMI, Mg++, presumed longer induction)   Fetal Well-Being: EFW 1226g (13%), AC<3% concerning for fetal growth restriction at 31w0. Cephalic by sutures, recent U/S.  -- Category I - continuous fetal monitoring  -- GBS pending - will obtain culture then start PCN -- BMZ 3/17-18 and 4/19-20   Superimposed Preeclampsia with Severe Features (LFTs, BP): BP moderate range. Asymptomatic.  -- Mg++ -- daily labs -- labetalol 300mg  BID + hyralazine protocol prn   Maddalena Linarez S. Earlene Plater, DO OB/GYN Fellow, Faculty Practice  9:54 AM

## 2019-04-16 NOTE — Progress Notes (Signed)
LABOR PROGRESS NOTE  Jean Mata is a 28 y.o. Mata at [redacted]w[redacted]d  admitted for IOL for SIPE w SF  Subjective: Sleeping comfortably   Objective: BP 100/61   Pulse 91   Temp 97.9 F (36.6 C) (Oral)   Resp 16   Ht 6\' 2"  (1.88 m)   Wt 103.6 kg   LMP 10/11/2018 (Approximate)   SpO2 100%   BMI 29.32 kg/m  or  Vitals:   04/15/19 2333 04/16/19 0001 04/16/19 0132 04/16/19 0201  BP:  125/82 (!) 102/58 100/61  Pulse:  90 90 91  Resp:  16    Temp: 97.9 F (36.6 C)     TempSrc: Oral     SpO2:      Weight:      Height:        Dilation: 4 Effacement (%): 50 Cervical Position: Anterior Station: -2 Presentation: Vertex Exam by:: K.Lanc[place], RN FHT: baseline rate 135, moderate varibility, + acel, no decel Toco: difficult to trace; patient not feeling contractions  Labs: Lab Results  Component Value Date   WBC 15.2 (H) 04/15/2019   HGB 11.3 (L) 04/15/2019   HCT 33.5 (L) 04/15/2019   MCV 89.8 04/15/2019   PLT 328 04/15/2019    Patient Active Problem List   Diagnosis Date Noted  . Bipolar disorder (HCC) 04/08/2019  . Mild bipolar I disorder, most recent episode depressed (HCC)   . Gastroenteritis 04/06/2019  . Chronic hypertension with superimposed severe preeclampsia 03/04/2019  . No prenatal care in current pregnancy 03/04/2019  . Elevated LFTs 03/04/2019  . Non-intractable vomiting 03/04/2019  . Hypokalemia 03/04/2019    Assessment / Plan: Jean Mata at [redacted]w[redacted]d here for IOL for SIPE w SF  Labor: no pain with contractions, so check likely the same. Will wait to check until after patient rests or until she feels different  Fetal Wellbeing:  Cat 1 Pain Control:  Comfortable  Anticipated MOD:  SVD  Jean Mcknight,MD OB Fellow  04/16/2019, 2:58 AM

## 2019-04-16 NOTE — Discharge Summary (Signed)
Obstetrics Discharge Summary [facility]N Faculty [facility]   Patient Name: Jean Mata DOB: 08-Sep-1991 MRN: [id number]  Date of admission: 04/06/2019 Delivering MD: Tamera Stands   Date of discharge: 04/18/2019  Admitting diagnosis: vomiting  Intrauterine pregnancy: [redacted]w[redacted]d     Secondary diagnosis:   Principal Problem:   Chronic hypertension with superimposed severe preeclampsia Active Problems:   No prenatal care in current pregnancy   Elevated LFTs   Non-intractable vomiting   Hypokalemia   Gastroenteritis   Bipolar disorder (HCC)   Mild bipolar I disorder, most recent episode depressed (HCC)    Discharge diagnosis: Preterm Pregnancy Delivered - induction for worsening severe preeclampsia                         Postpartum procedures: None  Complications: none  Outpatient Follow-Up: [ ]  depo for birth control, counsel on LARCs [ ]  connection to primary care for management of chronic HTN [ ]  connection to psychiatry for management of bipolar disorder - Zyprexa started  [ ]  counsel on role of Makena in subsequent pregnancies   Hospital course: Jean Mata is a 28 y.o. [redacted]w[redacted]d who was admitted for severe superimposed preeclampsia. About a week into her antepartum admission, she developed worsening transaminitis (300-500s) and her severe range blood pressures were more difficult to control. At this point, she was transferred to L&D for induction of worsening severe superimposed preeclampsia. She received two courses of betamethasone and was on Mg++ for the duration of her induction. Her pregnancy was complicated by above noted. Her labor course was notable for induction with FB, cytotec and pitocin followed by AROM and epidural placement. She was complete soon after AROM, and she delivered a viable infant.. Delivery was complicated by fetal bradycardia four about 5 minutes prior to delivery and 1st degree perineal and periurethral lacerations. Please see delivery/op note for additional  details. Her postpartum course was uncomplicated. Her infant remained in the NICU because of preterm delivery. She received magnesium sulfate for 24 hours post delivery and was started on Enalapril for BP control. By day of discharge, she was passing flatus, urinating, eating and drinking without difficulty. Her pain was well-controlled, and she was discharged home with ibuprofen and tylenol. She will follow-up in clinic in 2 weeks for BP check.   Physical exam  Vitals:   04/17/19 1620 04/17/19 1923 04/17/19 2353 04/18/19 0627  BP: 120/68 119/60 126/72 135/81  Pulse: 86 80 82 70  Resp:  17 16 17   Temp:  98.2 F (36.8 C) 98.2 F (36.8 C) 98.3 F (36.8 C)  TempSrc:  Oral Oral Oral  SpO2:  99% 100%   Weight:      Height:       General: alert, cooperative and without distress Lochia: appropriate Uterine Fundus: firm DVT Evaluation: No evidence of DVT seen on physical exam. Labs: Lab Results  Component Value Date   WBC 16.2 (H) 04/18/2019   HGB 9.0 (L) 04/18/2019   HCT 27.9 (L) 04/18/2019   MCV 93.6 04/18/2019   PLT 275 04/18/2019   CMP Latest Ref Rng & Units 04/18/2019  Glucose 70 - 99 mg/dL 82  BUN 6 - 20 mg/dL 7  Creatinine 7.89 - 3.81 mg/dL 0.17  Sodium 510 - 258 mmol/L 138  Potassium 3.5 - 5.1 mmol/L 3.8  Chloride 98 - 111 mmol/L 108  CO2 22 - 32 mmol/L 20(L)  Calcium 8.9 - 10.3 mg/dL 5.2(D)  Total Protein 6.5 - 8.1 g/dL 7.8(E)  Total Bilirubin 0.3 - 1.2 mg/dL 0.5  Alkaline Phos 38 - 126 U/L 85  AST 15 - 41 U/L 66(H)  ALT 0 - 44 U/L 207(H)    Discharge instructions: Per After Visit Summary and "Baby and Me Booklet"  After visit meds:  Allergies as of 04/18/2019      Reactions   Pollen Extract    Strawberry Extract Itching      Medication List    STOP taking these medications   glycopyrrolate 2 MG tablet Commonly known as:  Robinul-Forte   metoCLOPramide 10 MG tablet Commonly known as:  REGLAN     TAKE these medications   acetaminophen 500 MG  tablet Commonly known as:  TYLENOL Take 2 tablets (1,000 mg total) by mouth every 6 (six) hours as needed for fever or headache.   enalapril 5 MG tablet Commonly known as:  VASOTEC Take 1 tablet (5 mg total) by mouth daily.   ibuprofen 600 MG tablet Commonly known as:  ADVIL Take 1 tablet (600 mg total) by mouth every 6 (six) hours.   medroxyPROGESTERone 150 MG/ML injection Commonly known as:  DEPO-PROVERA Inject 1 mL (150 mg total) into the muscle every 3 (three) months.   pantoprazole 40 MG tablet Commonly known as:  PROTONIX Take 1 tablet (40 mg total) by mouth 2 (two) times daily.       Postpartum contraception: Depo Provera Diet: Routine Diet Activity: Advance as tolerated. Pelvic rest for 6 weeks.   Follow-up Appt:Will be scheduled for BP check in the office in 2 weeks Follow-up Visit:No follow-ups on file.  Please schedule this patient for Postpartum visit in: 4 weeks with the following provider: Any provider High risk pregnancy complicated by: no prenatal care, severe preeclampsia, chronic hypertension, MJ use, chronic N/V Delivery mode:  SVD Anticipated Birth Control:  Depo, received prior to discharge PP Procedures needed: BP check, repeat labs (CMP)  Schedule Integrated BH visit: yes (no PNC, history of bipolar, MJ use in pregnancy)  Newborn Data: Live born female  Birth Weight: 3 lb 4.2 oz (1480 g) APGAR: 6, 8  Newborn Delivery   Birth date/time:  04/16/2019 12:12:00 Delivery type:  Vaginal, Spontaneous    Baby Feeding: Bottle Disposition:NICU

## 2019-04-16 NOTE — Anesthesia Postprocedure Evaluation (Signed)
Anesthesia Post Note  Patient: [name]  Procedure(s) Performed: AN AD HOC LABOR EPIDURAL     Patient location during evaluation: Mother Baby Anesthesia Type: Epidural Level of consciousness: awake and alert and oriented Pain management: satisfactory to patient Vital Signs Assessment: post-procedure vital signs reviewed and stable Respiratory status: spontaneous breathing and nonlabored ventilation Cardiovascular status: stable Postop Assessment: no headache, no backache, no signs of nausea or vomiting, adequate PO intake and patient able to bend at knees (patient up walking) Anesthetic complications: no    Last Vitals:  Vitals:   04/16/19 1415 04/16/19 1519  BP: 116/73 128/81  Pulse: 83 82  Resp: 16 18  Temp: 36.5 C 36.4 C  SpO2: 100% 100%    Last Pain:  Vitals:   04/16/19 1519  TempSrc: Oral  PainSc:    Pain Goal: Patients Stated Pain Goal: 3 (04/15/19 1839)                 Madison Hickman

## 2019-04-17 LAB — COMPREHENSIVE METABOLIC PANEL
ALT: 257 U/L — ABNORMAL HIGH (ref 0–44)
AST: 102 U/L — ABNORMAL HIGH (ref 15–41)
Albumin: 2.1 g/dL — ABNORMAL LOW (ref 3.5–5.0)
Alkaline Phosphatase: 83 U/L (ref 38–126)
Anion gap: 9 (ref 5–15)
BUN: <5 mg/dL — ABNORMAL LOW (ref 6–20)
CO2: 21 mmol/L — ABNORMAL LOW (ref 22–32)
Calcium: 7.6 mg/dL — ABNORMAL LOW (ref 8.9–10.3)
Chloride: 104 mmol/L (ref 98–111)
Creatinine, Ser: 0.55 mg/dL (ref 0.44–1.00)
GFR calc Af Amer: >60 mL/min (ref >60)
GFR calc non Af Amer: >60 mL/min (ref >60)
Glucose, Bld: 92 mg/dL (ref 70–99)
Potassium: 3.6 mmol/L (ref 3.5–5.1)
Sodium: 134 mmol/L — ABNORMAL LOW (ref 135–145)
Total Bilirubin: 0.5 mg/dL (ref 0.3–1.2)
Total Protein: 4.9 g/dL — ABNORMAL LOW (ref 6.5–8.1)

## 2019-04-17 LAB — CBC
HCT: 24.8 % — ABNORMAL LOW (ref 36.0–46.0)
Hemoglobin: 8.2 g/dL — ABNORMAL LOW (ref 12.0–15.0)
MCH: 30.6 pg (ref 26.0–34.0)
MCHC: 33.1 g/dL (ref 30.0–36.0)
MCV: 92.5 fL (ref 80.0–100.0)
Platelets: 240 10*3/uL (ref 150–400)
RBC: 2.68 MIL/uL — ABNORMAL LOW (ref 3.87–5.11)
RDW: 13.1 % (ref 11.5–15.5)
WBC: 19.2 10*3/uL — ABNORMAL HIGH (ref 4.0–10.5)
nRBC: 0 % (ref 0.0–0.2)

## 2019-04-17 LAB — CULTURE, BETA STREP (GROUP B ONLY)

## 2019-04-17 LAB — MAGNESIUM: Magnesium: 4.5 mg/dL — ABNORMAL HIGH (ref 1.7–2.4)

## 2019-04-17 MED ORDER — ENALAPRIL MALEATE 5 MG PO TABS
5.0000 mg | ORAL_TABLET | Freq: Every day | ORAL | Status: DC
  Administered 2019-04-18: 5 mg via ORAL
  Filled 2019-04-17: qty 1

## 2019-04-17 MED ORDER — OLANZAPINE 5 MG PO TABS
5.0000 mg | ORAL_TABLET | Freq: Every day | ORAL | Status: DC
  Administered 2019-04-17 – 2019-04-18 (×2): 5 mg via ORAL
  Filled 2019-04-17 (×2): qty 1

## 2019-04-17 NOTE — Lactation Note (Addendum)
This note was copied from a baby's chart. Lactation Consultation Note  Patient Name: Jean Mata [id number] Date: 04/17/2019 Reason for consult: Initial assessment;Primapara;1st time breastfeeding;NICU baby;Preterm <34wks;Infant < 6lbs   Chronic HTN, THC use  Visited with P1 Mom of preterm infant in the NICU.  Baby 26 hrs old.  Mom had a DEBP set up at bedside, but stated she wasn't told how to pump.  She was told the Lactation Consultant would be in the next day.    Offered to assist with first pumping, but Mom stated she was about to get her lunch tray.  Reviewed breast massage and hand expression.  Mom has very large, heavy breasts.  Brought in cloth diaper to roll up for support under her breasts.  Showed Mom how to collect drops of colostrum in containers.   Sized flanges with pump.  24 mm flanges are a good fit.  Coconut oil at bedside.  Explained uses for this, prior to pumping to help with soreness.  Mom explained the importance of early, regular pumping to support a full milk supply.  Wrote instructions on dry erase board- 1- Skin to skin with baby, when able 2- breast massage and hand expression to collect colostrum for baby 3- Pump both breasts on initiation setting every 2-3 hrs (ask for help) 4- Follow-up with [facility] about pump loaner, or private insurance company [facility] referral faxed to [facility])  Lactation brochure and NICU booklet given to Mom.  Encouraged Mom to read through NICU book.    Encouraged Mom to ask for guidance with pumping prn.  Interventions Interventions: Skin to skin;Breast massage;Hand express;DEBP;Coconut oil  Lactation Tools Discussed/Used Tools: Pump;Coconut oil Breast pump type: Double-Electric Breast Pump WIC Program: No Pump Review: Setup, frequency, and cleaning;Milk Storage Initiated by:: Erby Pian RN IBCLC Date initiated:: 04/17/19   Consult Status Consult Status: Follow-up Date: 04/18/19 Follow-up type: In-patient    [name] 04/17/2019, 2:18 PM

## 2019-04-17 NOTE — Progress Notes (Signed)
CSW acknowledges consult and completed clinical assessment.  Clinical documentation will follow.  There are no barriers to d/c.  Iyonnah Ferrante Boyd-Gilyard, MSW, LCSW Clinical Social Work [phone]   

## 2019-04-17 NOTE — Progress Notes (Signed)
Post Partum Day 1 Subjective: Patient reports feeling well without complaints of HA, visual changes, RUQ/epigastric pain. She is ambulating, voiding and tolerating po intake without complications  Objective: Blood pressure (!) 97/45, pulse 75, temperature 97.9 F (36.6 C), temperature source Oral, resp. rate 16, height 6\' 2"  (1.88 m), weight 103.6 kg, last menstrual period 10/11/2018, SpO2 100 %.  Physical Exam:  General: alert, cooperative and no distress Lochia: appropriate Uterine Fundus: firm DVT Evaluation: No evidence of DVT seen on physical exam.  Recent Labs    04/16/19 1323 04/17/19 0500  HGB 10.0* 8.2*  HCT 30.2* 24.8*    Assessment/Plan: Continue magnesium sulfate for seizure prophylaxis until noon Continue monitoring BP LFTs trending down Continue routine postpartum care   LOS: 11 days   Jean Mata 04/17/2019, 9:10 AM

## 2019-04-18 LAB — COMPREHENSIVE METABOLIC PANEL
ALT: 207 U/L — ABNORMAL HIGH (ref 0–44)
AST: 66 U/L — ABNORMAL HIGH (ref 15–41)
Albumin: 2.2 g/dL — ABNORMAL LOW (ref 3.5–5.0)
Alkaline Phosphatase: 85 U/L (ref 38–126)
Anion gap: 10 (ref 5–15)
BUN: 7 mg/dL (ref 6–20)
CO2: 20 mmol/L — ABNORMAL LOW (ref 22–32)
Calcium: 8.5 mg/dL — ABNORMAL LOW (ref 8.9–10.3)
Chloride: 108 mmol/L (ref 98–111)
Creatinine, Ser: 0.58 mg/dL (ref 0.44–1.00)
GFR calc Af Amer: >60 mL/min (ref >60)
GFR calc non Af Amer: >60 mL/min (ref >60)
Glucose, Bld: 82 mg/dL (ref 70–99)
Potassium: 3.8 mmol/L (ref 3.5–5.1)
Sodium: 138 mmol/L (ref 135–145)
Total Bilirubin: 0.5 mg/dL (ref 0.3–1.2)
Total Protein: 5.2 g/dL — ABNORMAL LOW (ref 6.5–8.1)

## 2019-04-18 LAB — MAGNESIUM: Magnesium: 1.7 mg/dL (ref 1.7–2.4)

## 2019-04-18 LAB — CBC
HCT: 27.9 % — ABNORMAL LOW (ref 36.0–46.0)
Hemoglobin: 9 g/dL — ABNORMAL LOW (ref 12.0–15.0)
MCH: 30.2 pg (ref 26.0–34.0)
MCHC: 32.3 g/dL (ref 30.0–36.0)
MCV: 93.6 fL (ref 80.0–100.0)
Platelets: 275 10*3/uL (ref 150–400)
RBC: 2.98 MIL/uL — ABNORMAL LOW (ref 3.87–5.11)
RDW: 13.3 % (ref 11.5–15.5)
WBC: 16.2 10*3/uL — ABNORMAL HIGH (ref 4.0–10.5)
nRBC: 0 % (ref 0.0–0.2)

## 2019-04-18 MED ORDER — ACETAMINOPHEN 500 MG PO TABS: 1000.0000 mg | ORAL_TABLET | Freq: Four times a day (QID) | ORAL | 0 refills | Status: AC | PRN

## 2019-04-18 MED ORDER — MEDROXYPROGESTERONE ACETATE 150 MG/ML IM SUSP: 150.0000 mg | INTRAMUSCULAR | 4 refills | Status: DC

## 2019-04-18 MED ORDER — MEDROXYPROGESTERONE ACETATE 150 MG/ML IM SUSP
150.0000 mg | Freq: Once | INTRAMUSCULAR | Status: AC
  Administered 2019-04-18: 150 mg via INTRAMUSCULAR
  Filled 2019-04-18: qty 1

## 2019-04-18 MED ORDER — IBUPROFEN 600 MG PO TABS: 600.0000 mg | ORAL_TABLET | Freq: Four times a day (QID) | ORAL | 0 refills | Status: DC

## 2019-04-18 MED ORDER — ENALAPRIL MALEATE 5 MG PO TABS: 5.0000 mg | ORAL_TABLET | Freq: Every day | ORAL | 0 refills | Status: DC

## 2019-04-18 MED ORDER — ACETAMINOPHEN 500 MG PO TABS: 1000.0000 mg | ORAL_TABLET | Freq: Four times a day (QID) | ORAL | Status: DC | PRN

## 2019-04-18 NOTE — Progress Notes (Signed)
Discharge instructions and prescriptions given to pt. Discussed post-vaginal delivery care, signs and symptoms to report to the MD, upcoming appointments, and meds. Pt verbalizes understanding and has no questions at this time. Midline discontinued and pt tolerated well. Pt discharged from hospital in stable condition.

## 2019-04-18 NOTE — Lactation Note (Signed)
This note was copied from a baby's chart. Lactation Consultation Note  Patient Name: Girl Zinna Saulsbury [id number] Date: 04/18/2019 Reason for consult: Follow-up assessment;Primapara;1st time breastfeeding;Infant < 6lbs;Preterm <34wks;NICU baby Pecola Leisure is 41 hours old .  Mom is for D/C today.  Per mom [place]/ WIC has called her and she will be picking up her DEBP today .  As pumped x 2 in 24 hours with #24 F and its comfortable/ mom denies soreness.  Sore nipple and engorgement prevention/ storage of breast milk reviewed according to the NICU booklet.  LC encouraged mom to bring per pump pieces to NICU and pump in front of the baby.  Mom mentioned she is hand expressing and massaging her breast.  LC reviewed supply and demand / importance of being consistent with pumping around the clock at least  8 - 10 times day.  LC encouraged mom if she has any breast feeding questions to call or when visiting baby in NICU to  Have baby's RN to call LC.   Maternal Data Has patient been taught Hand Expression?: Yes  Feeding Feeding Type: Donor Breast Milk  LATCH Score                   Interventions Interventions: Breast feeding basics reviewed  Lactation Tools Discussed/Used Tools: Pump(pumped x 2 in the last 24 hours ) Breast pump type: Double-Electric Breast Pump WIC Program: Yes(per mom [place] / WIC called her ) Pump Review: Milk Storage(reviewed )   Consult Status Consult Status: PRN Date: (baby is in NICU ) Follow-up type: Other (comment)    Matilde Sprang Kaileigh Viswanathan 04/18/2019, 10:23 AM

## 2019-04-18 NOTE — Discharge Instructions (Signed)
Postpartum Hypertension Postpartum hypertension is high blood pressure that remains higher than normal after childbirth. You may not realize that you have postpartum hypertension if your blood pressure is not being checked regularly. In most cases, postpartum hypertension will go away on its own, usually within a week of delivery. However, for some women, medical treatment is required to prevent serious complications, such as seizures or stroke. What are the causes? This condition may be caused by one or more of the following:  Hypertension that existed before pregnancy (chronic hypertension).  Hypertension that comes on as a result of pregnancy (gestational hypertension).  Hypertensive disorders during pregnancy (preeclampsia) or seizures in women who have high blood pressure during pregnancy (eclampsia).  A condition in which the liver, platelets, and red blood cells are damaged during pregnancy (HELLP syndrome).  A condition in which the thyroid produces too much hormones (hyperthyroidism).  Other rare problems of the nerves (neurological disorders) or blood disorders. In some cases, the cause may not be known. What increases the risk? The following factors may make you more likely to develop this condition:  Chronic hypertension. In some cases, this may not have been diagnosed before pregnancy.  Obesity.  Type 2 diabetes.  Kidney disease.  History of preeclampsia or eclampsia.  Other medical conditions that change the level of hormones in the body (hormonal imbalance). What are the signs or symptoms? As with all types of hypertension, postpartum hypertension may not have any symptoms. Depending on how high your blood pressure is, you may experience:  Headaches. These may be mild, moderate, or severe. They may also be steady, Adalaide Jaskolski, or sudden in onset (thunderclap headache).  Changes in your ability to see (visual changes).  Dizziness.  Shortness of breath.  Swelling  of your hands, feet, lower legs, or face. In some cases, you may have swelling in more than one of these locations.  Heart palpitations or a racing heartbeat.  Difficulty breathing while lying down.  Decrease in the amount of urine that you pass. Other rare signs and symptoms may include:  Sweating more than usual. This lasts longer than a few days after delivery.  Chest pain.  Sudden dizziness when you get up from sitting or lying down.  Seizures.  Nausea or vomiting.  Abdominal pain. How is this diagnosed? This condition may be diagnosed based on the results of a physical exam, blood pressure measurements, and blood and urine tests. You may also have other tests, such as a CT scan or an MRI, to check for other problems of postpartum hypertension. How is this treated? If blood pressure is high enough to require treatment, your options may include:  Medicines to reduce blood pressure (antihypertensives). Tell your health care provider if you are breastfeeding or if you plan to breastfeed. There are many antihypertensive medicines that are safe to take while breastfeeding.  Stopping medicines that may be causing hypertension.  Treating medical conditions that are causing hypertension.  Treating the complications of hypertension, such as seizures, stroke, or kidney problems. Your health care provider will also continue to monitor your blood pressure closely until it is within a safe range for you. Follow these instructions at home:  Take over-the-counter and prescription medicines only as told by your health care provider.  Return to your normal activities as told by your health care provider. Ask your health care provider what activities are safe for you.  Do not use any products that contain nicotine or tobacco, such as cigarettes and e-cigarettes. If   you need help quitting, ask your health care provider. °· Keep all follow-up visits as told by your health care provider. This  is important. °Contact a health care provider if: °· Your symptoms get worse. °· You have new symptoms, such as: °? A headache that does not get better. °? Dizziness. °? Visual changes. °Get help right away if: °· You suddenly develop swelling in your hands, ankles, or face. °· You have sudden, rapid weight gain. °· You develop difficulty breathing, chest pain, racing heartbeat, or heart palpitations. °· You develop severe pain in your abdomen. °· You have any symptoms of a stroke. "BE FAST" is an easy way to remember the main warning signs of a stroke: °? B - Balance. Signs are dizziness, sudden trouble walking, or loss of balance. °? E - Eyes. Signs are trouble seeing or a sudden change in vision. °? F - Face. Signs are sudden weakness or numbness of the face, or the face or eyelid drooping on one side. °? A - Arms. Signs are weakness or numbness in an arm. This happens suddenly and usually on one side of the body. °? S - Speech. Signs are sudden trouble speaking, slurred speech, or trouble understanding what people say. °? T - Time. Time to call emergency services. Write down what time symptoms started. °· You have other signs of a stroke, such as: °? A sudden, severe headache with no known cause. °? Nausea or vomiting. °? Seizure. °These symptoms may represent a serious problem that is an emergency. Do not wait to see if the symptoms will go away. Get medical help right away. Call your local emergency services (911 in the [place] Do not drive yourself to the hospital. °Summary °· Postpartum hypertension is high blood pressure that remains higher than normal after childbirth. °· In most cases, postpartum hypertension will go away on its own, usually within a week of delivery. °· For some women, medical treatment is required to prevent serious complications, such as seizures or stroke. °This information is not intended to replace advice given to you by your health care provider. Make sure you discuss any questions  you have with your health care provider. °Document Released: 08/07/2014 Document Revised: 09/24/2017 Document Reviewed: 09/24/2017 °Elsevier Interactive Patient Education © 2019 Elsevier Inc. °Postpartum Care After Vaginal Delivery °This sheet gives you information about how to care for yourself from the time you deliver your baby to up to 6-12 weeks after delivery (postpartum period). Your health care provider may also give you more specific instructions. If you have problems or questions, contact your health care provider. °Follow these instructions at home: °Vaginal bleeding °· It is normal to have vaginal bleeding (lochia) after delivery. Wear a sanitary pad for vaginal bleeding and discharge. °? During the first week after delivery, the amount and appearance of lochia is often similar to a menstrual period. °? Over the next few weeks, it will gradually decrease to a dry, yellow-brown discharge. °? For most women, lochia stops completely by 4-6 weeks after delivery. Vaginal bleeding can vary from woman to woman. °· Change your sanitary pads frequently. Watch for any changes in your flow, such as: °? A sudden increase in volume. °? A change in color. °? Large blood clots. °· If you pass a blood clot from your vagina, save it and call your health care provider to discuss. Do not flush blood clots down the toilet before talking with your health care provider. °· Do not use tampons or douches   until your health care provider says this is safe.  If you are not breastfeeding, your period should return 6-8 weeks after delivery. If you are feeding your child breast milk only (exclusive breastfeeding), your period may not return until you stop breastfeeding. Perineal care  Keep the area between the vagina and the anus (perineum) clean and dry as told by your health care provider. Use medicated pads and pain-relieving sprays and creams as directed.  If you had a cut in the perineum (episiotomy) or a tear in the  vagina, check the area for signs of infection until you are healed. Check for: ? More redness, swelling, or pain. ? Fluid or blood coming from the cut or tear. ? Warmth. ? Pus or a bad smell.  You may be given a squirt bottle to use instead of wiping to clean the perineum area after you go to the bathroom. As you start healing, you may use the squirt bottle before wiping yourself. Make sure to wipe gently.  To relieve pain caused by an episiotomy, a tear in the vagina, or swollen veins in the anus (hemorrhoids), try taking a warm sitz bath 2-3 times a day. A sitz bath is a warm water bath that is taken while you are sitting down. The water should only come up to your hips and should cover your buttocks. Breast care  Within the first few days after delivery, your breasts may feel heavy, full, and uncomfortable (breast engorgement). Milk may also leak from your breasts. Your health care provider can suggest ways to help relieve the discomfort. Breast engorgement should go away within a few days.  If you are breastfeeding: ? Wear a bra that supports your breasts and fits you well. ? Keep your nipples clean and dry. Apply creams and ointments as told by your health care provider. ? You may need to use breast pads to absorb milk that leaks from your breasts. ? You may have uterine contractions every time you breastfeed for up to several weeks after delivery. Uterine contractions help your uterus return to its normal size. ? If you have any problems with breastfeeding, work with your health care provider or Advertising copywriter.  If you are not breastfeeding: ? Avoid touching your breasts a lot. Doing this can make your breasts produce more milk. ? Wear a good-fitting bra and use cold packs to help with swelling. ? Do not squeeze out (express) milk. This causes you to make more milk. Intimacy and sexuality  Ask your health care provider when you can engage in sexual activity. This may depend  on: ? Your risk of infection. ? How fast you are healing. ? Your comfort and desire to engage in sexual activity.  You are able to get pregnant after delivery, even if you have not had your period. If desired, talk with your health care provider about methods of birth control (contraception). Medicines  Take over-the-counter and prescription medicines only as told by your health care provider.  If you were prescribed an antibiotic medicine, take it as told by your health care provider. Do not stop taking the antibiotic even if you start to feel better. Activity  Gradually return to your normal activities as told by your health care provider. Ask your health care provider what activities are safe for you.  Rest as much as possible. Try to rest or take a nap while your baby is sleeping. Eating and drinking   Drink enough fluid to keep your urine pale yellow.  Eat high-fiber foods every day. These may help prevent or relieve constipation. High-fiber foods include: ? Whole grain cereals and breads. ? Brown rice. ? Beans. ? Fresh fruits and vegetables.  Do not try to lose weight quickly by cutting back on calories.  Take your prenatal vitamins until your postpartum checkup or until your health care provider tells you it is okay to stop. Lifestyle  Do not use any products that contain nicotine or tobacco, such as cigarettes and e-cigarettes. If you need help quitting, ask your health care provider.  Do not drink alcohol, especially if you are breastfeeding. General instructions  Keep all follow-up visits for you and your baby as told by your health care provider. Most women visit their health care provider for a postpartum checkup within the first 3-6 weeks after delivery. Contact a health care provider if:  You feel unable to cope with the changes that your child brings to your life, and these feelings do not go away.  You feel unusually sad or worried.  Your breasts become  red, painful, or hard.  You have a fever.  You have trouble holding urine or keeping urine from leaking.  You have little or no interest in activities you used to enjoy.  You have not breastfed at all and you have not had a menstrual period for 12 weeks after delivery.  You have stopped breastfeeding and you have not had a menstrual period for 12 weeks after you stopped breastfeeding.  You have questions about caring for yourself or your baby.  You pass a blood clot from your vagina. Get help right away if:  You have chest pain.  You have difficulty breathing.  You have sudden, severe leg pain.  You have severe pain or cramping in your lower abdomen.  You bleed from your vagina so much that you fill more than one sanitary pad in one hour. Bleeding should not be heavier than your heaviest period.  You develop a severe headache.  You faint.  You have blurred vision or spots in your vision.  You have bad-smelling vaginal discharge.  You have thoughts about hurting yourself or your baby. If you ever feel like you may hurt yourself or others, or have thoughts about taking your own life, get help right away. You can go to the nearest emergency department or call:  Your local emergency services (911 in the [place]  A suicide crisis helpline, such as the National Suicide Prevention Lifeline at 703-409-8846. This is open 24 hours a day. Summary  The period of time right after you deliver your newborn up to 6-12 weeks after delivery is called the postpartum period.  Gradually return to your normal activities as told by your health care provider.  Keep all follow-up visits for you and your baby as told by your health care provider. This information is not intended to replace advice given to you by your health care provider. Make sure you discuss any questions you have with your health care provider. Document Released: 10/01/2007 Document Revised: 09/17/2017 Document Reviewed:  09/17/2017 Elsevier Interactive Patient Education  2019 [employer].

## 2019-04-18 NOTE — Clinical Social Work Maternal (Signed)
CLINICAL SOCIAL WORK MATERNAL/CHILD NOTE  Patient Details  Name: Jean Mata MRN: [id number] Date of Birth: Apr 09, 1991  Date:  04/18/2019  Clinical Social Worker Initiating Note:  Laurey Arrow Date/Time: Initiated:  04/18/19/1008     Child's Name:  Glenford Peers   Biological Parents:  Mother, Father   Need for Interpreter:  None   Reason for Referral:  Current Substance Use/Substance Use During Pregnancy , Behavioral Health Concerns, Late or No Prenatal Care    Address:  [place] [address]    Phone number:  [phone] (home)     Additional phone number:   Household Members/Support Persons (HM/SP):   Household Member/Support Person 1   HM/SP Name Relationship DOB or Age  HM/SP -1 Musa Savage FOB 05/07/1992  HM/SP -2        HM/SP -3        HM/SP -4        HM/SP -5        HM/SP -6        HM/SP -7        HM/SP -8          Natural Supports (not living in the home):  Friends, Community(per MOB, MOB's mother will be a support but she resides in [place]. )   Professional Supports: Therapist   Employment: Unemployed   Type of Work:     Education:  [name] arranged: No  Financial Resources:  [employer], Theatre stage manager Resources:  (CSW provided MOB with information to apply for [employer] and Physicist, medical.)   Cultural/Religious Considerations Which May Impact Care:  None reported  Strengths:  Ability to meet basic needs , Home prepared for child    Psychotropic Medications:         Pediatrician:       Pediatrician List:   [place]      Pediatrician Fax Number:    Risk Factors/Current Problems:  Mental Health Concerns , Substance Use    Cognitive State:  Alert , Able to Concentrate , Insightful , Linear Thinking    Mood/Affect:  Happy , Interested , Comfortable , Bright    CSW Assessment: CSW  met with MOB in room 113 to complete an assessment for No PNC, SA abuse, and MH hx. When CSW arrived, MOB was resting in bed.  MOB was polite, forthcoming, and receptive to meeting with CSW.   MOB remembered CSW from previous interaction while MOB has been on [facility] unit. CSW inquired about MOB's thoughts and feeling regarding infant's NICU admission and being a new mom.  MOB reported feeling good and feeling better knowing "My little girlcare is getting the care that she needs."  CSW provided education regarding the baby blues period vs. perinatal mood disorders, discussed treatment and gave resources for mental health follow up if concerns arise.  CSW recommends self-evaluation during the postpartum time period using the New Mom Checklist from Postpartum Progress and encouraged MOB to contact a medical professional if symptoms are noted at any time. MOB acknowledged dx of bipolar disorder.  CSW assessed for safety and MOB denied SI, HI, and DV.  MOB reported having a good support team that has been supportive of MOB's MH. MOB communicated feeling comfortable seeking help if warranted and plans to scheduled an appointment with  outpatient therapist prior to MOB's discharge.  MOB also requested to have her medication for her bipolar administered while inpatient.  CSW agreed to speak with MOB's bedside nurse to see if starting MOB's meds is an options (CSW spoke with bedside and she agree to consult with MD).  CSW asked about MOB's SA hx and MOB reported smoking marijuana during her pregnancy to increase her appetite and decrease her nausea. MOB reported her last use of marijuana was 2 weeks ago.  CSW reviewed hospital's SA policy and no prenatal care policy.  MOB was understanding and denied having any questions.  MOB denied the use of all other illicit substances. MOB is aware that infant's UDS is negative and that CSW will continue to monitor infant's CDS and will make a report to [place] if warranted. CSW offered MOB resources for SA interventions and MOB declined.   MOB communicated that MOB does not have a car seat or a bassinet/crib for infant but plans to purchase one prior to infant's discharge. CSW encouraged MOB to reach out to CSW if MOB is unable to purchase essential items; MOB agreed.   MOB denied barriers to follow-up appointments for infant and barriers to [facility] visiting with infant.  CSW explained NICU visitation policy and MOB was understanding.   CSW will continue to offer family resources and supports while infant remains in NICU.   CSW provided review of Sudden Infant Death Syndrome (SIDS) precautions.   CSW identifies no further need for intervention and no barriers to discharge at this time.     CSW Plan/Description:  Sudden Infant Death Syndrome (SIDS) Education, Perinatal Mood and Anxiety Disorder (PMADs) Education, Psychosocial Support and Ongoing Assessment of Needs, Other Patient/Family Education, [place], CSW Will Continue to Monitor Umbilical Cord Tissue Drug Screen Results and Make Report if Warranted, Other Information/Referral to [place], MSW, [facility] Clinical Social Work [phone]   Dimple Nanas, [place] 04/18/2019, 10:34 AM

## 2019-04-28 ENCOUNTER — Telehealth: Payer: Self-pay | Admitting: Obstetrics and Gynecology

## 2019-04-28 NOTE — Telephone Encounter (Signed)
Called patient about her appointment for postpartum. Left a VM message.

## 2019-04-29 ENCOUNTER — Telehealth: Payer: Self-pay | Admitting: Obstetrics and Gynecology

## 2019-04-29 NOTE — Telephone Encounter (Signed)
Attempted to call patient about her BP appointment. Left a detailed voicemail message on her phone.

## 2019-05-01 ENCOUNTER — Other Ambulatory Visit: Payer: Self-pay

## 2019-05-01 ENCOUNTER — Ambulatory Visit: Payer: BLUE CROSS/BLUE SHIELD | Admitting: *Deleted

## 2019-05-01 NOTE — Progress Notes (Signed)
2130: Attempted to contact pt regarding her appointment.  Pt did not pick up.  Left voicemail informing pt that she was being contacted regarding her appointment and requesting she contact the clinic.  Will attempt again in 15 minutes.  8657: Attempted to contact pt for a second time regarding her appointment.  Pt did not pick up.  Left voicemail informing the pt that she was being contacted regarding her appointment for the second time and requesting that she contact the clinic to reschedule.

## 2019-05-16 ENCOUNTER — Encounter: Payer: Self-pay | Admitting: Obstetrics and Gynecology

## 2019-05-16 ENCOUNTER — Telehealth: Payer: Self-pay | Admitting: Obstetrics and Gynecology

## 2019-05-16 NOTE — Telephone Encounter (Signed)
Attempted to call patient twice, and tried contact person as well. Her number would not go through. I will mail her a letter about her appointment.

## 2019-05-21 ENCOUNTER — Telehealth: Payer: Self-pay | Admitting: Nurse Practitioner

## 2019-05-21 NOTE — Telephone Encounter (Signed)
Called the patient to confirm the visit tomorrow. Left a detailed message of how to download mychart. Also sent a link via the patients e-mail and informed in the voicemail message.

## 2019-05-22 ENCOUNTER — Encounter: Payer: Self-pay | Admitting: Obstetrics and Gynecology

## 2019-05-22 ENCOUNTER — Other Ambulatory Visit: Payer: Self-pay

## 2019-05-22 ENCOUNTER — Telehealth: Payer: BLUE CROSS/BLUE SHIELD | Admitting: Obstetrics and Gynecology

## 2019-05-22 ENCOUNTER — Telehealth: Payer: Self-pay | Admitting: Obstetrics and Gynecology

## 2019-05-22 NOTE — Progress Notes (Signed)
Patient did not keep her virtual appointment today.    Duane Lope, NP 05/22/2019 1:43 PM

## 2019-05-22 NOTE — Progress Notes (Signed)
LVM @118pm    Attempted to call the patient at 125pm to do postpartum visit no answer LVM to call office back to schedule postpartum visit.

## 2019-05-22 NOTE — Telephone Encounter (Signed)
Attempted to call patient to get her rescheduled for her missed postpartum visit. A gentleman answer the phone and requested the number be removed from her chart because it is not her number. Number was removed and no show letter mailed

## 2019-08-18 IMAGING — US US MFM FETAL BPP WO NON STRESS
1 series · 13 of 28 positions shown · non-contrast
Comparison: none

[Series 1: us mfm fetal bpp wo non stress · 37 acquisitions, 13 frames shown]
[im 2/37]
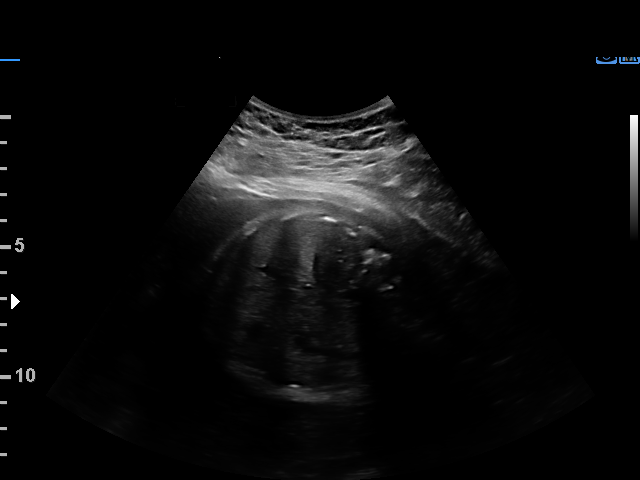
[im 5/37]
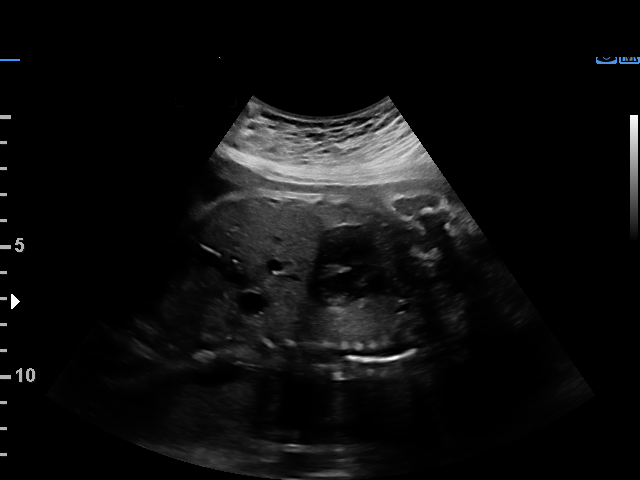
[im 7/37]
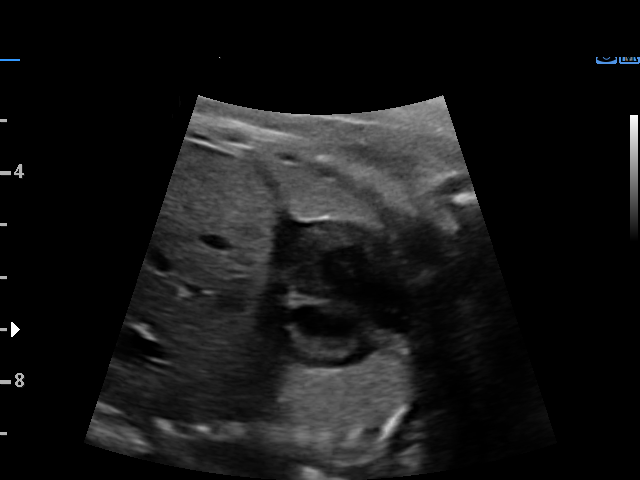
[im 10/37]
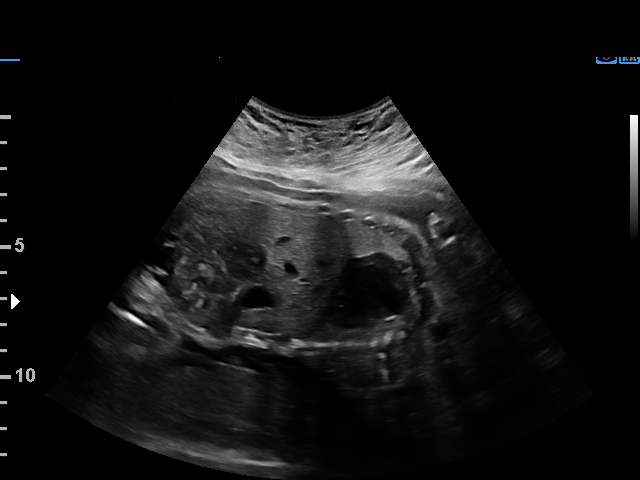
[im 13/37]
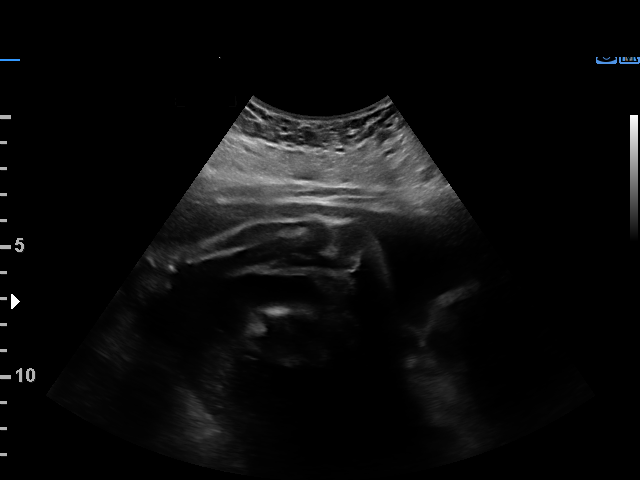
[im 15/37]
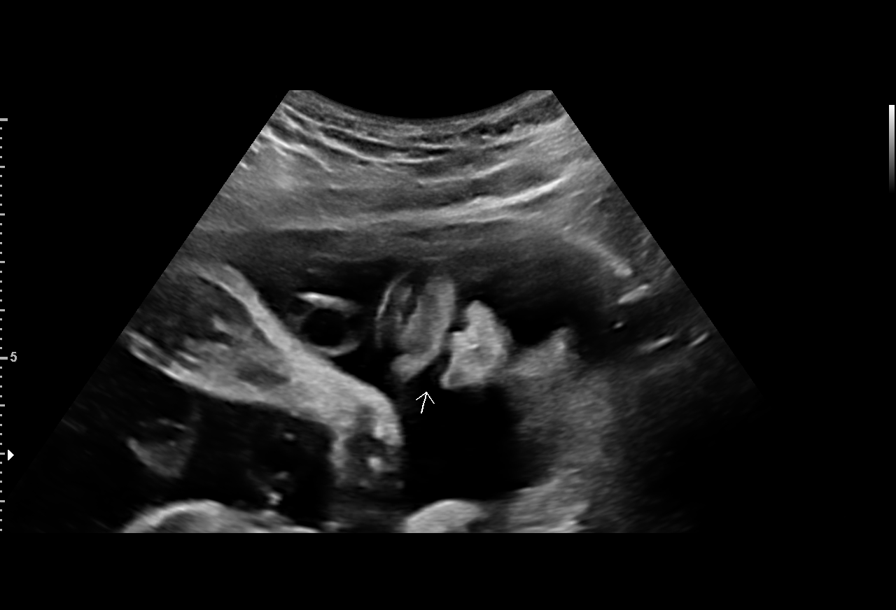
[im 19/37]
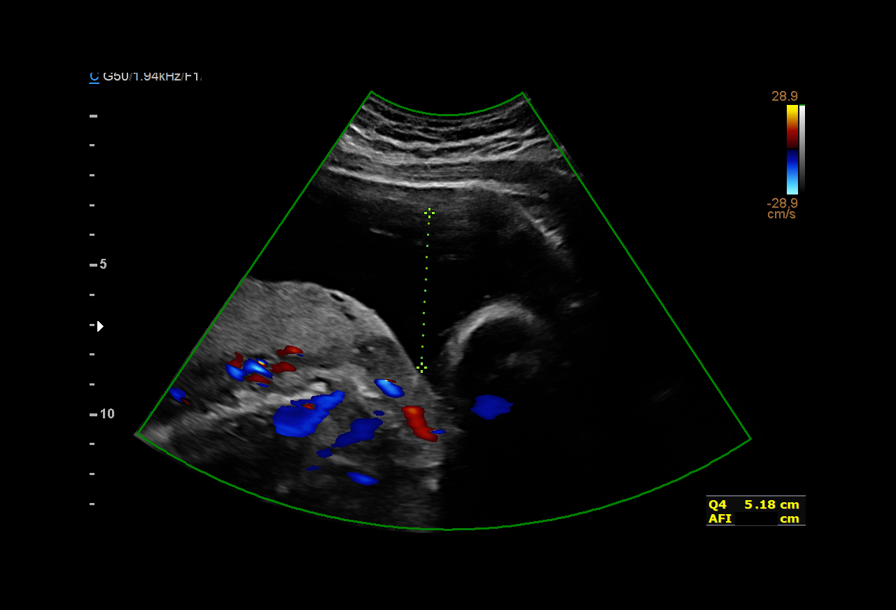
[im 22/37]
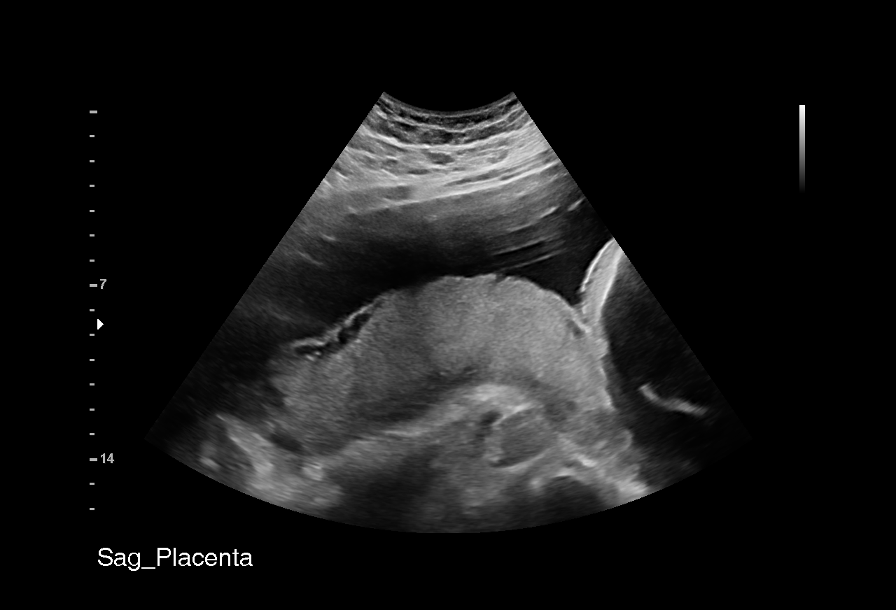
[im 25/37]
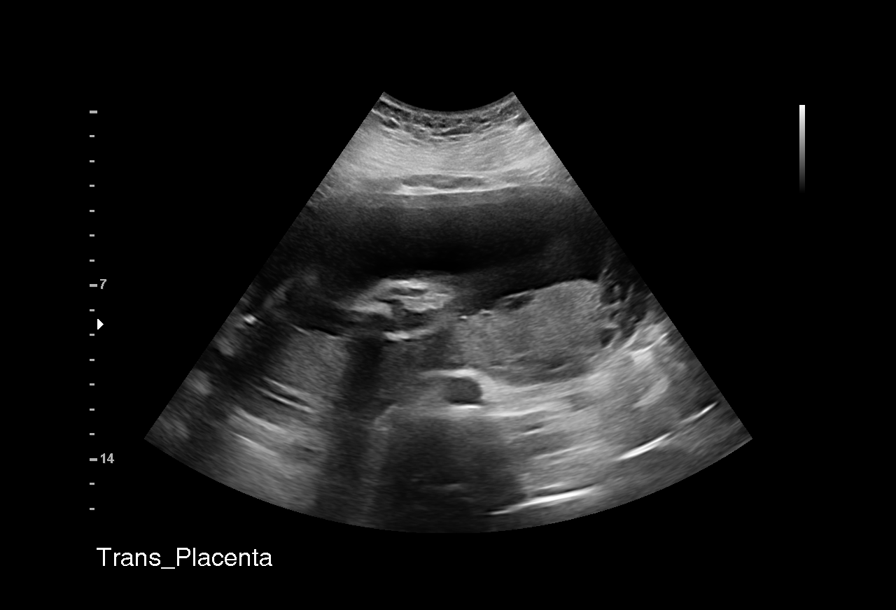
[im 27/37]
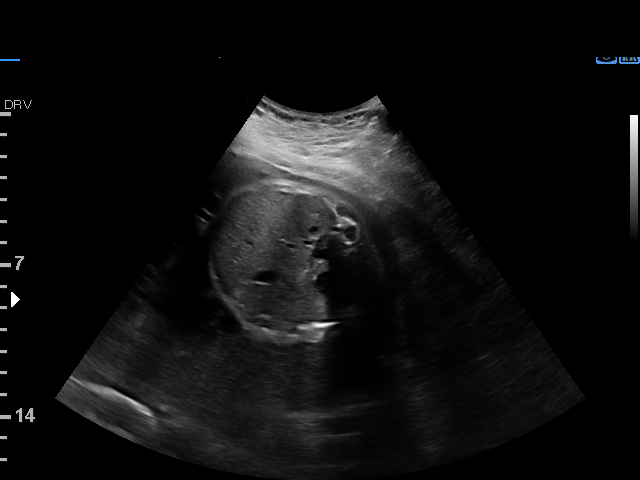
[im 30/37]
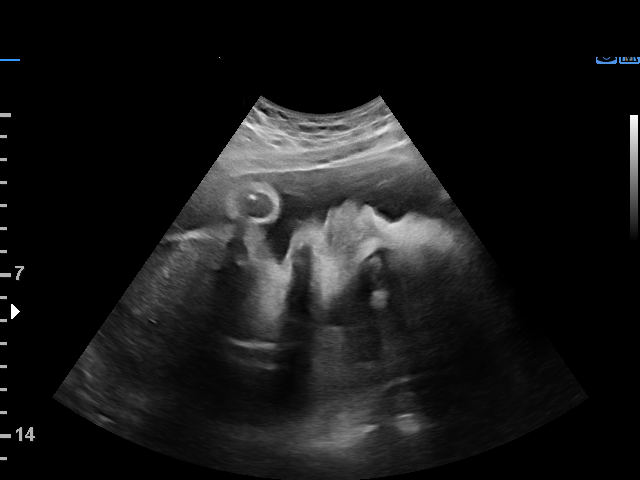
[im 33/37]
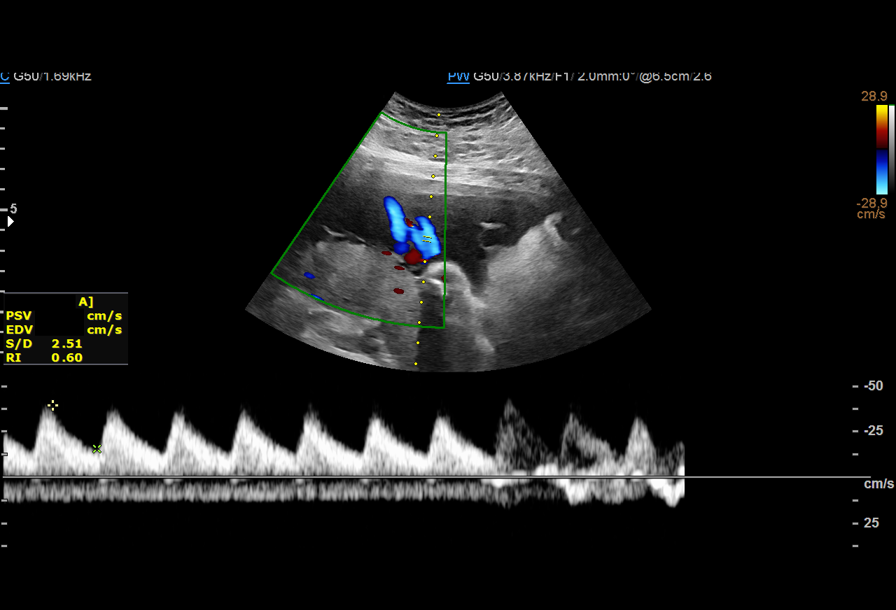
[im 35/37]
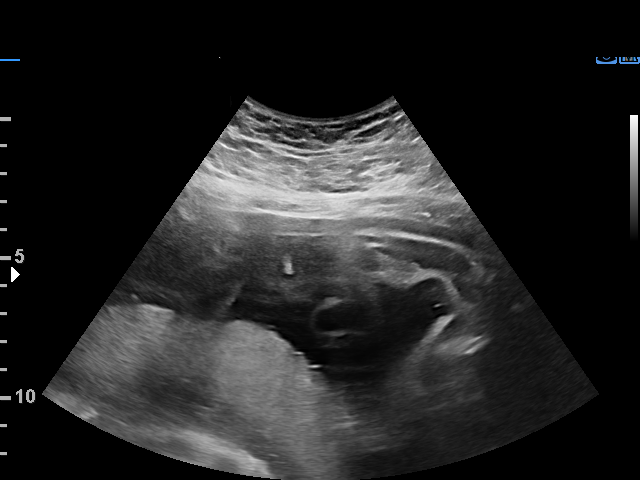

[13 of 28 positions shown; findings below may reference images not displayed]

2  US MFM UA CORD DOPPLER               76820.02     REESE TIGER
 ----------------------------------------------------------------------

 ----------------------------------------------------------------------
Indications

  Pre-eclampsia
  31 weeks gestation of pregnancy
  Insufficient Prenatal Care (no prenatal care)
  Hypertension - Chronic/Pre-existing
  (labetalol)
  Maternal care for known or suspected poor
  fetal growth, third trimester, not applicable or
  unspecified
 ----------------------------------------------------------------------
Vital Signs

 BMI:
Fetal Evaluation

 Num Of Fetuses:         1
 Fetal Heart Rate(bpm):  152
 Cardiac Activity:       Observed
 Presentation:           Cephalic
 Placenta:               Posterior

 Amniotic Fluid
 AFI FV:      Within normal limits

 AFI Sum(cm)     %Tile       Largest Pocket(cm)
 13.53           43

 RUQ(cm)       RLQ(cm)       LUQ(cm)        LLQ(cm)

Biophysical Evaluation

 Amniotic F.V:   Within normal limits       F. Tone:        Observed
 F. Movement:    Observed                   Score:          [DATE]
 F. Breathing:   Observed
OB History

 Gravidity:    1
Gestational Age

 LMP:           25w 6d        Date:  10/11/18                 EDD:   07/18/19
 Best:          31w 3d     Det. By:  Previous Ultrasound      EDD:   06/09/19
                                     (10/30/18)
Doppler - Fetal Vessels

 Umbilical Artery
  S/D     %tile                                            ADFV    RDFV
 2.68       48                                                No      No

Cervix Uterus Adnexa

 Cervix
 Not visualized (advanced GA >40wks)
Impression

 Preeclampsia with severe features. Fetal growth restriction.
 Amniotic fluid is normal and good fetal activity is seen.
 Antenatal testing is reassuring. BPP [DATE]. Umbilical artery
 Doppler showed normal forward diastolic flow.
Recommendations

 -Continue twice-weekly Doppler and BPP.
                 [place], [place]

## 2019-09-01 ENCOUNTER — Encounter (HOSPITAL_COMMUNITY): Payer: Self-pay

## 2020-05-08 ENCOUNTER — Emergency Department (HOSPITAL_COMMUNITY)
Admission: EM | Admit: 2020-05-08 | Discharge: 2020-05-08 | Disposition: A | Discharge status: Home / Self Care | Payer: Medicaid Other | Attending: Emergency Medicine | Admitting: Emergency Medicine

## 2020-05-08 ENCOUNTER — Encounter (HOSPITAL_COMMUNITY): Payer: Self-pay | Admitting: Emergency Medicine

## 2020-05-08 ENCOUNTER — Other Ambulatory Visit: Payer: Self-pay

## 2020-05-08 ENCOUNTER — Emergency Department (HOSPITAL_COMMUNITY): Payer: Medicaid Other

## 2020-05-08 DIAGNOSIS — O26891 Other specified pregnancy related conditions, first trimester: Secondary | ICD-10-CM | POA: Insufficient documentation

## 2020-05-08 DIAGNOSIS — B9689 Other specified bacterial agents as the cause of diseases classified elsewhere: Secondary | ICD-10-CM | POA: Diagnosis not present

## 2020-05-08 DIAGNOSIS — R102 Pelvic and perineal pain: Secondary | ICD-10-CM | POA: Insufficient documentation

## 2020-05-08 DIAGNOSIS — O23511 Infections of cervix in pregnancy, first trimester: Secondary | ICD-10-CM | POA: Insufficient documentation

## 2020-05-08 DIAGNOSIS — O23591 Infection of other part of genital tract in pregnancy, first trimester: Secondary | ICD-10-CM | POA: Diagnosis not present

## 2020-05-08 DIAGNOSIS — Z3A01 Less than 8 weeks gestation of pregnancy: Secondary | ICD-10-CM | POA: Diagnosis not present

## 2020-05-08 DIAGNOSIS — Z79899 Other long term (current) drug therapy: Secondary | ICD-10-CM | POA: Insufficient documentation

## 2020-05-08 DIAGNOSIS — N72 Inflammatory disease of cervix uteri: Secondary | ICD-10-CM

## 2020-05-08 DIAGNOSIS — O2391 Unspecified genitourinary tract infection in pregnancy, first trimester: Secondary | ICD-10-CM | POA: Insufficient documentation

## 2020-05-08 DIAGNOSIS — Z87891 Personal history of nicotine dependence: Secondary | ICD-10-CM | POA: Insufficient documentation

## 2020-05-08 DIAGNOSIS — A5901 Trichomonal vulvovaginitis: Secondary | ICD-10-CM

## 2020-05-08 DIAGNOSIS — R8271 Bacteriuria: Secondary | ICD-10-CM

## 2020-05-08 LAB — COMPREHENSIVE METABOLIC PANEL
ALT: 12 U/L (ref 0–44)
AST: 15 U/L (ref 15–41)
Albumin: 3.9 g/dL (ref 3.5–5.0)
Alkaline Phosphatase: 61 U/L (ref 38–126)
Anion gap: 13 (ref 5–15)
BUN: 10 mg/dL (ref 6–20)
CO2: 19 mmol/L — ABNORMAL LOW (ref 22–32)
Calcium: 9.5 mg/dL (ref 8.9–10.3)
Chloride: 106 mmol/L (ref 98–111)
Creatinine, Ser: 0.69 mg/dL (ref 0.44–1.00)
GFR calc Af Amer: >60 mL/min (ref >60)
GFR calc non Af Amer: >60 mL/min (ref >60)
Glucose, Bld: 130 mg/dL — ABNORMAL HIGH (ref 70–99)
Potassium: 3.8 mmol/L (ref 3.5–5.1)
Sodium: 138 mmol/L (ref 135–145)
Total Bilirubin: 0.7 mg/dL (ref 0.3–1.2)
Total Protein: 7.6 g/dL (ref 6.5–8.1)

## 2020-05-08 LAB — CBC
HCT: 36.8 % (ref 36.0–46.0)
Hemoglobin: 11.7 g/dL — ABNORMAL LOW (ref 12.0–15.0)
MCH: 29.3 pg (ref 26.0–34.0)
MCHC: 31.8 g/dL (ref 30.0–36.0)
MCV: 92 fL (ref 80.0–100.0)
Platelets: 383 10*3/uL (ref 150–400)
RBC: 4 MIL/uL (ref 3.87–5.11)
RDW: 14.3 % (ref 11.5–15.5)
WBC: 15.3 10*3/uL — ABNORMAL HIGH (ref 4.0–10.5)
nRBC: 0 % (ref 0.0–0.2)

## 2020-05-08 LAB — URINALYSIS, ROUTINE W REFLEX MICROSCOPIC
Bilirubin Urine: NEGATIVE
Glucose, UA: >=500 mg/dL — AB
Ketones, ur: 20 mg/dL — AB
Nitrite: NEGATIVE
Protein, ur: 100 mg/dL — AB
Specific Gravity, Urine: 1.025 (ref 1.005–1.030)
pH: 6 (ref 5.0–8.0)

## 2020-05-08 LAB — WET PREP, GENITAL
Clue Cells Wet Prep HPF POC: NONE SEEN
Sperm: NONE SEEN
Yeast Wet Prep HPF POC: NONE SEEN

## 2020-05-08 LAB — I-STAT BETA HCG BLOOD, ED (MC, WL, AP ONLY): I-stat hCG, quantitative: >2000 m[IU]/mL — ABNORMAL HIGH (ref <5)

## 2020-05-08 MED ORDER — AZITHROMYCIN 1 G PO PACK
1.0000 g | PACK | Freq: Once | ORAL | Status: DC
  Filled 2020-05-08: qty 1

## 2020-05-08 MED ORDER — ACETAMINOPHEN 500 MG PO TABS
1000.0000 mg | ORAL_TABLET | Freq: Once | ORAL | Status: AC
  Administered 2020-05-08: 1000 mg via ORAL
  Filled 2020-05-08: qty 2

## 2020-05-08 MED ORDER — SODIUM CHLORIDE 0.9% FLUSH: 3.0000 mL | Freq: Once | INTRAVENOUS | Status: DC

## 2020-05-08 MED ORDER — ONDANSETRON HCL 4 MG/2ML IJ SOLN
4.0000 mg | Freq: Once | INTRAMUSCULAR | Status: AC
  Administered 2020-05-08: 4 mg via INTRAVENOUS
  Filled 2020-05-08: qty 2

## 2020-05-08 MED ORDER — SODIUM CHLORIDE 0.9 % IV SOLN
1.0000 g | Freq: Once | INTRAVENOUS | Status: AC
  Administered 2020-05-08: 1 g via INTRAVENOUS
  Filled 2020-05-08: qty 10

## 2020-05-08 MED ORDER — NITROFURANTOIN MONOHYD MACRO 100 MG PO CAPS: 100.0000 mg | ORAL_CAPSULE | Freq: Two times a day (BID) | ORAL | 0 refills | Status: AC

## 2020-05-08 MED ORDER — PROMETHAZINE HCL 25 MG PO TABS: 25.0000 mg | ORAL_TABLET | Freq: Three times a day (TID) | ORAL | 0 refills | Status: AC | PRN

## 2020-05-08 MED ORDER — METRONIDAZOLE 500 MG PO TABS
2000.0000 mg | ORAL_TABLET | Freq: Once | ORAL | Status: AC
  Administered 2020-05-08: 2000 mg via ORAL
  Filled 2020-05-08: qty 4

## 2020-05-08 MED ORDER — SODIUM CHLORIDE 0.9 % IV BOLUS
500.0000 mL | Freq: Once | INTRAVENOUS | Status: AC
  Administered 2020-05-08: 500 mL via INTRAVENOUS

## 2020-05-08 NOTE — ED Notes (Signed)
Patient appears to be having difficulty taking medications; Md notified. Md spoke with pt and explained risks/benefits of taking meds.

## 2020-05-08 NOTE — Discharge Instructions (Addendum)
Stop enalapril due to pregnancy.

## 2020-05-08 NOTE — ED Notes (Signed)
Called Ultrasound for Dr Nicanor Alcon to rule out Eptopic Preg--[place]

## 2020-05-08 NOTE — ED Notes (Signed)
Paged Dr Vickey Sages to Dr Karel Jarvis

## 2020-05-08 NOTE — ED Provider Notes (Addendum)
[facility] EMERGENCY DEPARTMENT Provider Note   CSN: [id number] Arrival date & time: 05/08/20  0231     History Chief Complaint  Patient presents with  . Abdominal Pain    Jean Mata is a 29 y.o. female.  The history is provided by the patient.  Abdominal Pain Pain location: pelvic  Pain quality: sharp   Pain radiates to:  Does not radiate Pain severity:  Moderate Onset quality:  Gradual Timing:  Constant Progression:  Unchanged Chronicity:  Recurrent Context: not recent illness, not recent sexual activity, not recent travel, not suspicious food intake and not trauma   Context comment:  Nausea vomiting and diarrhea.  Also missed period.  Had same symptoms with last pregnancy.   Relieved by:  Nothing Worsened by:  Nothing Ineffective treatments:  None tried Associated symptoms: diarrhea, nausea and vomiting   Associated symptoms: no anorexia, no belching, no chest pain, no chills, no constipation, no cough, no dysuria, no fatigue, no fever, no flatus, no hematemesis, no hematochezia, no hematuria, no shortness of breath and no sore throat   Associated symptoms comment:  Pink spotting  Risk factors: no alcohol abuse        Past Medical History:  Diagnosis Date  . Asthma   . Bipolar disorder (HCC)   . Depression   . Elevated LFTs   . Hydrocephalus (HCC)   . Obesity   . Trichimoniasis     Patient Active Problem List   Diagnosis Date Noted  . Bipolar disorder (HCC) 04/08/2019  . Mild bipolar I disorder, most recent episode depressed (HCC)   . Gastroenteritis 04/06/2019  . Chronic hypertension with superimposed severe preeclampsia 03/04/2019  . No prenatal care in current pregnancy 03/04/2019  . Elevated LFTs 03/04/2019  . Non-intractable vomiting 03/04/2019  . Hypokalemia 03/04/2019    Past Surgical History:  Procedure Laterality Date  . BRAIN SURGERY      Had hydrocephalus and fluid was removed in 2009 (no shunt).      OB History      Gravida  1   Para      Term      Preterm      AB      Living        SAB      TAB      Ectopic      Multiple      Live Births              History reviewed. No pertinent family history.  Social History   Tobacco Use  . Smoking status: Former Games developer  . Smokeless tobacco: Never Used  Substance Use Topics  . Alcohol use: Not Currently    Comment: last drink 09/2018  . Drug use: Yes    Types: Marijuana    Comment: last use was 1700 on 04/06/19    Home Medications Prior to Admission medications   Medication Sig Start Date End Date Taking? Authorizing Provider  acetaminophen (TYLENOL) 500 MG tablet Take 2 tablets (1,000 mg total) by mouth every 6 (six) hours as needed for fever or headache. 04/18/19  Yes Constant, Peggy, MD  QUEtiapine (SEROQUEL) 50 MG tablet Take 2 tablets by mouth at bedtime. 03/02/20  Yes [provider]  enalapril (VASOTEC) 5 MG tablet Take 1 tablet (5 mg total) by mouth daily. Patient not taking: Reported on 05/08/2020 04/18/19   Constant, Peggy, MD  ibuprofen (ADVIL) 600 MG tablet Take 1 tablet (600 mg total) by mouth  every 6 (six) hours. Patient not taking: Reported on 05/08/2020 04/18/19   Constant, Peggy, MD  medroxyPROGESTERone (DEPO-PROVERA) 150 MG/ML injection Inject 1 mL (150 mg total) into the muscle every 3 (three) months. Patient not taking: Reported on 05/08/2020 04/18/19   Constant, Peggy, MD  pantoprazole (PROTONIX) 40 MG tablet Take 1 tablet (40 mg total) by mouth 2 (two) times daily. Patient not taking: Reported on 05/08/2020 03/09/19   Reva Bores, MD    Allergies    Pineapple, Pollen extract, and Strawberry extract  Review of Systems   Review of Systems  Constitutional: Negative for chills, fatigue and fever.  HENT: Negative for sore throat.   Eyes: Negative for visual disturbance.  Respiratory: Negative for cough and shortness of breath.   Cardiovascular: Negative for chest pain.  Gastrointestinal: Positive for  abdominal pain, diarrhea, nausea and vomiting. Negative for anorexia, constipation, flatus, hematemesis and hematochezia.  Genitourinary: Negative for dysuria and hematuria.  Musculoskeletal: Negative for arthralgias.  Skin: Negative for pallor.  Psychiatric/Behavioral: Negative for agitation.  All other systems reviewed and are negative.   Physical Exam Updated Vital Signs BP (!) 148/92   Pulse 78   Temp 97.8 F (36.6 C) (Oral)   Resp 20   Ht 6\' 2"  (1.88 m)   Wt 90 kg   SpO2 100%   BMI 25.47 kg/m   Physical Exam Vitals and nursing note reviewed. Exam conducted with a chaperone present.  Constitutional:      General: She is not in acute distress.    Appearance: Normal appearance.  HENT:     Head: Normocephalic and atraumatic.     Nose: Nose normal.  Eyes:     Pupils: Pupils are equal, round, and reactive to light.  Cardiovascular:     Rate and Rhythm: Normal rate and regular rhythm.     Pulses: Normal pulses.     Heart sounds: Normal heart sounds.  Pulmonary:     Effort: Pulmonary effort is normal.     Breath sounds: Normal breath sounds.  Abdominal:     General: Abdomen is flat. Bowel sounds are normal.     Tenderness: There is no abdominal tenderness. There is no guarding or rebound.  Genitourinary:    General: Normal vulva.     Vagina: Vaginal discharge present.     Cervix: Cervical motion tenderness present. No cervical bleeding.  Musculoskeletal:        General: Normal range of motion.     Cervical back: Normal range of motion and neck supple.  Neurological:     Mental Status: She is alert.     ED Results / Procedures / Treatments   Labs (all labs ordered are listed, but only abnormal results are displayed) Results for orders placed or performed during the hospital encounter of 05/08/20  Wet prep, genital   Specimen: Cervix  Result Value Ref Range   Yeast Wet Prep HPF POC NONE SEEN NONE SEEN   Trich, Wet Prep PRESENT (A) NONE SEEN   Clue Cells Wet  Prep HPF POC NONE SEEN NONE SEEN   WBC, Wet Prep HPF POC MANY (A) NONE SEEN   Sperm NONE SEEN   Comprehensive metabolic panel  Result Value Ref Range   Sodium 138 135 - 145 mmol/L   Potassium 3.8 3.5 - 5.1 mmol/L   Chloride 106 98 - 111 mmol/L   CO2 19 (L) 22 - 32 mmol/L   Glucose, Bld 130 (H) 70 - 99 mg/dL   BUN  10 6 - 20 mg/dL   Creatinine, Ser 1.66 0.44 - 1.00 mg/dL   Calcium 9.5 8.9 - 06.3 mg/dL   Total Protein 7.6 6.5 - 8.1 g/dL   Albumin 3.9 3.5 - 5.0 g/dL   AST 15 15 - 41 U/L   ALT 12 0 - 44 U/L   Alkaline Phosphatase 61 38 - 126 U/L   Total Bilirubin 0.7 0.3 - 1.2 mg/dL   GFR calc non Af Amer >60 >60 mL/min   GFR calc Af Amer >60 >60 mL/min   Anion gap 13 5 - 15  CBC  Result Value Ref Range   WBC 15.3 (H) 4.0 - 10.5 K/uL   RBC 4.00 3.87 - 5.11 MIL/uL   Hemoglobin 11.7 (L) 12.0 - 15.0 g/dL   HCT 01.6 01.0 - 93.2 %   MCV 92.0 80.0 - 100.0 fL   MCH 29.3 26.0 - 34.0 pg   MCHC 31.8 30.0 - 36.0 g/dL   RDW 35.5 73.2 - 20.2 %   Platelets 383 150 - 400 K/uL   nRBC 0.0 0.0 - 0.2 %  Urinalysis, Routine w reflex microscopic  Result Value Ref Range   Color, Urine YELLOW YELLOW   APPearance CLEAR CLEAR   Specific Gravity, Urine 1.025 1.005 - 1.030   pH 6.0 5.0 - 8.0   Glucose, UA >=500 (A) NEGATIVE mg/dL   Hgb urine dipstick SMALL (A) NEGATIVE   Bilirubin Urine NEGATIVE NEGATIVE   Ketones, ur 20 (A) NEGATIVE mg/dL   Protein, ur 542 (A) NEGATIVE mg/dL   Nitrite NEGATIVE NEGATIVE   Leukocytes,Ua SMALL (A) NEGATIVE   RBC / HPF 6-10 0 - 5 RBC/hpf   WBC, UA 11-20 0 - 5 WBC/hpf   Bacteria, UA RARE (A) NONE SEEN   Squamous Epithelial / LPF 0-5 0 - 5   Mucus PRESENT   I-Stat beta hCG blood, ED  Result Value Ref Range   I-stat hCG, quantitative >2,000.0 (H) <5 mIU/mL   Comment 3           US OB Comp < 14 Wks  Result Date: 05/08/2020 CLINICAL DATA:  Initial evaluation for acute right lower quadrant pain, positive beta HCG. EXAM: OBSTETRIC <14 WK ULTRASOUND TECHNIQUE:  Transabdominal ultrasound was performed for evaluation of the gestation as well as the maternal uterus and adnexal regions. COMPARISON:  None available. FINDINGS: Intrauterine gestational sac: Single Yolk sac:  Present Embryo:  Present Cardiac Activity: Present Heart Rate: 141 bpm CRL: 9.5 mm   7 w 0 d                  [place] EDC: 12/25/2020. Subchorionic hemorrhage:  None visualized. Maternal uterus/adnexae: Ovaries within normal limits bilaterally. 4.3 x 3.1 x 3.6 cm simple left ovarian cyst noted. No adnexal mass or free fluid within the pelvis. IMPRESSION: 1. Single viable intrauterine pregnancy as above, estimated gestational age [redacted] weeks and 0 days by crown-rump length, with ultrasound EDC of 12/25/2020. No complication. 2. 4.3 cm simple left ovarian cyst. These are a common finding in premenopausal females, and is almost certainly benign. No follow-up imaging recommended. 3. No other acute maternal uterine or adnexal abnormality identified. Electronically Signed   By: Rise Mu M.D.   On: 05/08/2020 04:54    EKG None  Radiology US OB Comp < 14 Wks  Result Date: 05/08/2020 CLINICAL DATA:  Initial evaluation for acute right lower quadrant pain, positive beta HCG. EXAM: OBSTETRIC <14 WK ULTRASOUND TECHNIQUE: Transabdominal ultrasound was performed  for evaluation of the gestation as well as the maternal uterus and adnexal regions. COMPARISON:  None available. FINDINGS: Intrauterine gestational sac: Single Yolk sac:  Present Embryo:  Present Cardiac Activity: Present Heart Rate: 141 bpm CRL: 9.5 mm   7 w 0 d                  [place] EDC: 12/25/2020. Subchorionic hemorrhage:  None visualized. Maternal uterus/adnexae: Ovaries within normal limits bilaterally. 4.3 x 3.1 x 3.6 cm simple left ovarian cyst noted. No adnexal mass or free fluid within the pelvis. IMPRESSION: 1. Single viable intrauterine pregnancy as above, estimated gestational age [redacted] weeks and 0 days by crown-rump length, with ultrasound EDC of  12/25/2020. No complication. 2. 4.3 cm simple left ovarian cyst. These are a common finding in premenopausal females, and is almost certainly benign. No follow-up imaging recommended. 3. No other acute maternal uterine or adnexal abnormality identified. Electronically Signed   By: Jeannine Boga M.D.   On: 05/08/2020 04:54    Procedures Procedures (including critical care time)  Medications Ordered in ED Medications  sodium chloride flush (NS) 0.9 % injection 3 mL (3 mLs Intravenous Not Given 05/08/20 0243)  azithromycin (ZITHROMAX) powder 1 g (1 g Oral Refused 05/08/20 0614)  cefTRIAXone (ROCEPHIN) 1 g in sodium chloride 0.9 % 100 mL IVPB (0 g Intravenous Stopped 05/08/20 0614)  sodium chloride 0.9 % bolus 500 mL (0 mLs Intravenous Stopped 05/08/20 0658)  metroNIDAZOLE (FLAGYL) tablet 2,000 mg (2,000 mg Oral Given 05/08/20 0614)  acetaminophen (TYLENOL) tablet 1,000 mg (1,000 mg Oral Given 05/08/20 0614)  ondansetron (ZOFRAN) injection 4 mg (4 mg Intravenous Given 05/08/20 7124)    ED Course  I have reviewed the triage vital signs and the nursing notes.  Pertinent labs & imaging results that were available during my care of the patient were reviewed by me and considered in my medical decision making (see chart for details).    Patient has a confirmed IUP she was treated for trichomoniasis in the ED.  Patient has not vomited in the ED.  I do not see a surgical cause for the patient's pain and believe it to be related to STIs.  I have treated for these in the ED and have advised not sexual activity until 7 days after all partners treated.  Will d/c with macrobid and phenergan.  Follow up with OB GYN.  Have informed patient to stop ACE-inhibitor due to pregnancy.  Discuss new BP med with your OB GYN. This was printed on your discharge summary  Jannett Sarnowski was evaluated in Emergency Department on 05/08/2020 for the symptoms described in the history of present illness. She was evaluated in the  context of the global COVID-19 pandemic, which necessitated consideration that the patient might be at risk for infection with the SARS-CoV-2 virus that causes COVID-19. Institutional protocols and algorithms that pertain to the evaluation of patients at risk for COVID-19 are in a state of rapid change based on information released by regulatory bodies including the CDC and federal and state organizations. These policies and algorithms were followed during the patient's care in the ED.   Final Clinical Impression(s) / ED Diagnoses Final diagnoses:  Less than [redacted] weeks gestation of pregnancy  Trichomoniasis of vagina  Cervicitis  Asymptomatic bacteriuria   Return for intractable cough, coughing up blood,fevers >100.4 unrelieved by medication, shortness of breath, intractable vomiting, chest pain, shortness of breath, weakness,numbness, changes in speech, facial asymmetry,abdominal pain, passing out,Inability to tolerate  liquids or food, cough, altered mental status or any concerns. No signs of systemic illness or infection. The patient is nontoxic-appearing on exam and vital signs are within normal limits.   I have reviewed the triage vital signs and the nursing notes. Pertinent labs &imaging results that were available during my care of the patient were reviewed by me and considered in my medical decision making (see chart for details).After history, exam, and medical workup I feel the patient has beenappropriately medically screened and is safe for discharge home. Pertinent diagnoses were discussed with the patient. Patient was given return precautions.        Jesseka Drinkard, MD 05/08/20 [id number]    Cy BlamerPalumbo, Maleki Hippe, MD 05/08/20 [id number]

## 2020-05-08 NOTE — ED Triage Notes (Signed)
Patient arrived with EMS from home , reports RLQ abdominal pain with emesis and diarrhea onset this week , no fever or chills , CBG= 145, she received Zofran 4 mg IM by EMS .

## 2020-05-10 ENCOUNTER — Inpatient Hospital Stay (HOSPITAL_COMMUNITY)
Admission: AD | Admit: 2020-05-10 | Discharge: 2020-05-10 | Disposition: A | Discharge status: Home / Self Care | Payer: Medicaid Other | Attending: Obstetrics and Gynecology | Admitting: Obstetrics and Gynecology

## 2020-05-10 ENCOUNTER — Encounter (HOSPITAL_COMMUNITY): Payer: Self-pay | Admitting: Obstetrics and Gynecology

## 2020-05-10 ENCOUNTER — Inpatient Hospital Stay (HOSPITAL_COMMUNITY): Payer: Medicaid Other

## 2020-05-10 DIAGNOSIS — O99321 Drug use complicating pregnancy, first trimester: Secondary | ICD-10-CM | POA: Diagnosis not present

## 2020-05-10 DIAGNOSIS — Z87891 Personal history of nicotine dependence: Secondary | ICD-10-CM | POA: Diagnosis not present

## 2020-05-10 DIAGNOSIS — D72829 Elevated white blood cell count, unspecified: Secondary | ICD-10-CM | POA: Insufficient documentation

## 2020-05-10 DIAGNOSIS — O26891 Other specified pregnancy related conditions, first trimester: Secondary | ICD-10-CM | POA: Insufficient documentation

## 2020-05-10 DIAGNOSIS — Z711 Person with feared health complaint in whom no diagnosis is made: Secondary | ICD-10-CM

## 2020-05-10 DIAGNOSIS — R109 Unspecified abdominal pain: Secondary | ICD-10-CM | POA: Diagnosis present

## 2020-05-10 DIAGNOSIS — R1031 Right lower quadrant pain: Secondary | ICD-10-CM | POA: Insufficient documentation

## 2020-05-10 DIAGNOSIS — O99281 Endocrine, nutritional and metabolic diseases complicating pregnancy, first trimester: Secondary | ICD-10-CM | POA: Diagnosis not present

## 2020-05-10 DIAGNOSIS — F1210 Cannabis abuse, uncomplicated: Secondary | ICD-10-CM | POA: Insufficient documentation

## 2020-05-10 DIAGNOSIS — O99111 Other diseases of the blood and blood-forming organs and certain disorders involving the immune mechanism complicating pregnancy, first trimester: Secondary | ICD-10-CM | POA: Diagnosis not present

## 2020-05-10 DIAGNOSIS — Z8249 Family history of ischemic heart disease and other diseases of the circulatory system: Secondary | ICD-10-CM | POA: Insufficient documentation

## 2020-05-10 DIAGNOSIS — Z3A01 Less than 8 weeks gestation of pregnancy: Secondary | ICD-10-CM | POA: Diagnosis not present

## 2020-05-10 DIAGNOSIS — O219 Vomiting of pregnancy, unspecified: Secondary | ICD-10-CM | POA: Insufficient documentation

## 2020-05-10 DIAGNOSIS — E876 Hypokalemia: Secondary | ICD-10-CM | POA: Insufficient documentation

## 2020-05-10 HISTORY — DX: Essential (primary) hypertension: I10

## 2020-05-10 LAB — CBC WITH DIFFERENTIAL/PLATELET
Abs Immature Granulocytes: 0.1 10*3/uL — ABNORMAL HIGH (ref 0.00–0.07)
Basophils Absolute: 0.1 10*3/uL (ref 0.0–0.1)
Basophils Relative: 0 %
Eosinophils Absolute: 0 10*3/uL (ref 0.0–0.5)
Eosinophils Relative: 0 %
HCT: 46.1 % — ABNORMAL HIGH (ref 36.0–46.0)
Hemoglobin: 14.8 g/dL (ref 12.0–15.0)
Immature Granulocytes: 1 %
Lymphocytes Relative: 15 %
Lymphs Abs: 3.3 10*3/uL (ref 0.7–4.0)
MCH: 30.1 pg (ref 26.0–34.0)
MCHC: 32.1 g/dL (ref 30.0–36.0)
MCV: 93.7 fL (ref 80.0–100.0)
Monocytes Absolute: 1.4 10*3/uL — ABNORMAL HIGH (ref 0.1–1.0)
Monocytes Relative: 6 %
Neutro Abs: 17.3 10*3/uL — ABNORMAL HIGH (ref 1.7–7.7)
Neutrophils Relative %: 78 %
Platelets: 391 10*3/uL (ref 150–400)
RBC: 4.92 MIL/uL (ref 3.87–5.11)
RDW: 14.6 % (ref 11.5–15.5)
WBC: 22.2 10*3/uL — ABNORMAL HIGH (ref 4.0–10.5)
nRBC: 0 % (ref 0.0–0.2)

## 2020-05-10 LAB — COMPREHENSIVE METABOLIC PANEL
ALT: 17 U/L (ref 0–44)
AST: 22 U/L (ref 15–41)
Albumin: 4 g/dL (ref 3.5–5.0)
Alkaline Phosphatase: 63 U/L (ref 38–126)
Anion gap: 17 — ABNORMAL HIGH (ref 5–15)
BUN: 8 mg/dL (ref 6–20)
CO2: 21 mmol/L — ABNORMAL LOW (ref 22–32)
Calcium: 9.4 mg/dL (ref 8.9–10.3)
Chloride: 100 mmol/L (ref 98–111)
Creatinine, Ser: 0.78 mg/dL (ref 0.44–1.00)
GFR calc Af Amer: >60 mL/min (ref >60)
GFR calc non Af Amer: >60 mL/min (ref >60)
Glucose, Bld: 120 mg/dL — ABNORMAL HIGH (ref 70–99)
Potassium: 2.7 mmol/L — CL (ref 3.5–5.1)
Sodium: 138 mmol/L (ref 135–145)
Total Bilirubin: 1.4 mg/dL — ABNORMAL HIGH (ref 0.3–1.2)
Total Protein: 7.9 g/dL (ref 6.5–8.1)

## 2020-05-10 LAB — URINALYSIS, ROUTINE W REFLEX MICROSCOPIC
Glucose, UA: 50 mg/dL — AB
Ketones, ur: 20 mg/dL — AB
Nitrite: NEGATIVE
Protein, ur: >=300 mg/dL — AB
Specific Gravity, Urine: 1.027 (ref 1.005–1.030)
pH: 5 (ref 5.0–8.0)

## 2020-05-10 LAB — GC/CHLAMYDIA PROBE AMP (~~LOC~~) NOT AT ARMC
Chlamydia: NEGATIVE
Comment: NEGATIVE
Comment: NORMAL
Neisseria Gonorrhea: NEGATIVE

## 2020-05-10 LAB — RAPID URINE DRUG SCREEN, HOSP PERFORMED
Amphetamines: NOT DETECTED
Barbiturates: NOT DETECTED
Benzodiazepines: NOT DETECTED
Cocaine: NOT DETECTED
Opiates: NOT DETECTED
Tetrahydrocannabinol: POSITIVE — AB

## 2020-05-10 MED ORDER — POTASSIUM CHLORIDE ER 20 MEQ PO TBCR: 10.0000 meq | EXTENDED_RELEASE_TABLET | Freq: Two times a day (BID) | ORAL | 0 refills | Status: AC

## 2020-05-10 MED ORDER — PROMETHAZINE HCL 25 MG/ML IJ SOLN
12.5000 mg | Freq: Once | INTRAMUSCULAR | Status: AC
  Administered 2020-05-10: 12.5 mg via INTRAVENOUS
  Filled 2020-05-10: qty 1

## 2020-05-10 MED ORDER — LACTATED RINGERS IV BOLUS
1000.0000 mL | Freq: Once | INTRAVENOUS | Status: AC
  Administered 2020-05-10: 1000 mL via INTRAVENOUS

## 2020-05-10 MED ORDER — FAMOTIDINE IN NACL 20-0.9 MG/50ML-% IV SOLN
20.0000 mg | Freq: Once | INTRAVENOUS | Status: AC
  Administered 2020-05-10: 20 mg via INTRAVENOUS
  Filled 2020-05-10: qty 50

## 2020-05-10 MED ORDER — AZITHROMYCIN 250 MG PO TABS
1000.0000 mg | ORAL_TABLET | Freq: Once | ORAL | Status: AC
  Administered 2020-05-10: 1000 mg via ORAL
  Filled 2020-05-10: qty 4

## 2020-05-10 MED ORDER — ONDANSETRON 8 MG PO TBDP: 8.0000 mg | ORAL_TABLET | Freq: Three times a day (TID) | ORAL | 0 refills | Status: AC | PRN

## 2020-05-10 MED ORDER — POTASSIUM CHLORIDE CRYS ER 20 MEQ PO TBCR
40.0000 meq | EXTENDED_RELEASE_TABLET | Freq: Once | ORAL | Status: AC
  Administered 2020-05-10: 40 meq via ORAL
  Filled 2020-05-10: qty 2

## 2020-05-10 NOTE — Discharge Instructions (Signed)
Cannabinoid Hyperemesis Syndrome Cannabinoid hyperemesis syndrome (CHS) is a condition that causes repeated nausea, vomiting, and abdominal pain after long-term (chronic) use of marijuana (cannabis). People with CHS typically use marijuana 3-5 times a day for many years before they have symptoms, although it is possible to develop CHS with as little as 1 use per day. Symptoms of CHS may be mild at first but can get worse and more frequent. In some cases, CHS may cause vomiting many times a day, which can lead to weight loss and dehydration. CHS may go away and come back many times (recur). People may not have symptoms or may otherwise be healthy in between CHS attacks. What are the causes? The exact cause of this condition is not known. Long-term use of marijuana may over-stimulate certain proteins in the brain that react with chemicals in marijuana (cannabinoid receptors). This over-stimulation may cause CHS. What are the signs or symptoms? Symptoms of this condition are often mild during the first few attacks, but they can get worse over time. Symptoms may include:  Frequent nausea, especially early in the morning.  Vomiting.  Abdominal pain. Taking several hot showers throughout the day can also be a sign of this condition. People with CHS may do this because it relieves symptoms. How is this diagnosed? This condition may be diagnosed based on:  Your symptoms and medical history, including any drug use.  A physical exam. You may have tests done to rule out other problems. These tests may include:  Blood tests.  Urine tests.  Imaging tests, such as an X-ray or CT scan. How is this treated? Treatment for this condition involves stopping marijuana use. Your health care provider may recommend:  A drug rehabilitation program, if you have trouble stopping marijuana use.  Medicines for nausea.  Hot showers to help relieve symptoms. Certain creams that contain a substance called  capsaicin may improve symptoms when applied to the abdomen. Ask your health care provider before starting any medicines or other treatments. Severe nausea and vomiting may require you to stay at the hospital. You may need IV fluids to prevent or treat dehydration. You may also need certain medicines that must be given at the hospital. Follow these instructions at home: During an attack   Stay in bed and rest in a dark, quiet room.  Take anti-nausea medicine as told by your health care provider.  Try taking hot showers to relieve your symptoms. After an attack  Drink small amounts of clear fluids slowly. Gradually add more.  Once you are able to eat without vomiting, eat soft foods in small amounts every 3-4 hours. General instructions   Do not use any products that contain marijuana.If you need help quitting, ask your health care provider for resources and treatment options.  Drink enough fluid to keep your urine pale yellow. Avoid drinking fluids that have a lot of sugar or caffeine, such as coffee and soda.  Take and apply over-the-counter and prescription medicines only as told by your health care provider. Ask your health care provider before starting any new medicines or treatments.  Keep all follow-up visits as told by your health care provider. This is important. Contact a health care provider if:  Your symptoms get worse.  You cannot drink fluids without vomiting.  You have pain and trouble swallowing after an attack. Get help right away if:  You cannot stop vomiting.  You have blood in your vomit or your vomit looks like coffee grounds.  You have   severe abdominal pain.  You have stools that are bloody or black, or stools that look like tar.  You have symptoms of dehydration, such as: ? Sunken eyes. ? Inability to make tears. ? Cracked lips. ? Dry mouth. ? Decreased urine production. ? Weakness. ? Sleepiness. ? Fainting. Summary  Cannabinoid hyperemesis  syndrome (CHS) is a condition that causes repeated nausea, vomiting, and abdominal pain after long-term use of marijuana.  People with CHS typically use marijuana 3-5 times a day for many years before they have symptoms, although it is possible to develop CHS with as little as 1 use per day.  Treatment for this condition involves stopping marijuana use. Hot showers and capsaicin creams may also help relieve symptoms. Ask your health care provider before starting any medicines or other treatments.  Your health care provider may prescribe medicines to help with nausea.  Get help right away if you have signs of dehydration, such as dry mouth, decreased urine production, or weakness. This information is not intended to replace advice given to you by your health care provider. Make sure you discuss any questions you have with your health care provider. Document Revised: 04/12/2018 Document Reviewed: 03/14/2017 Elsevier Patient Education  2020 [employer]  

## 2020-05-10 NOTE — MAU Note (Addendum)
Pt reports to MAU via EMS with complaints of RLQ abd pain with nausea and vomiting that began approximately one week ago.

## 2020-05-10 NOTE — MAU Note (Signed)
CRITICAL VALUE STICKER  CRITICAL VALUE: Potassium 2.7  RECEIVER (on-site recipient of call): Ralene Muskrat RN   DATE & TIME NOTIFIED: 05/10/20 0610  NP NOTIFIED:  Donia Ast NP   TIME OF NOTIFICATION: [phone]

## 2020-05-10 NOTE — MAU Note (Signed)
Notified Judeth Horn, NP, that patient's BP was elevated to 160s/90s.  Pt reports Hx of HTN and not on meds.  No orders at this time.  If provider decided to order something for patient, she will call patient.  Dc'd home in good condition.

## 2020-05-10 NOTE — MAU Provider Note (Addendum)
History     CSN: [id number]  Arrival date and time: 05/10/20 8841   First Provider Initiated Contact with Patient 05/10/20 0319      Chief Complaint  Patient presents with  . Abdominal Pain  . Nausea   Ms. Vrinda Band is a 29 y.o. [id number] at [redacted]w[redacted]d who presents to MAU for N/V. Patient reports she was seen in [facility] last night for the same concerns as tonight and was given antibiotics and flagyl for trichomonas. Patient reports she took the flagyl at the hospital successfully. Patient was also given Macrobid for asymptomatic bacteriuria without urine culture sent.  Onset: 1.5 weeks ago Location: stomach Duration: 1.5 weeks Character: worsened over past 1.5 weeks, patient reports cannot drink or eat anything Aggravating/Associated: none/menstrual-like cramps Relieving: none Treatment: pt denies taking any medications other than what was given to her in the ED yesterday  Pt denies VB, vaginal discharge/odor/itching. Pt denies constipation, diarrhea, or urinary problems. Pt denies fever, chills, fatigue, sweating or changes in appetite. Pt denies SOB or chest pain. Pt denies dizziness, HA, light-headedness, weakness.  Problems this pregnancy include: pt has not yet been seen. Allergies? Pineapple, pollen, strawberry Current medications/supplements? Seroquel Prenatal care provider? Pt requests a list of OB Providers  Patient intermittently falling asleep during interview.   OB History    Gravida  4   Para  1   Term      Preterm  1   AB  1   Living  1     SAB  1   TAB      Ectopic      Multiple      Live Births  1           Past Medical History:  Diagnosis Date  . Asthma   . Bipolar disorder (HCC)   . Depression   . Elevated LFTs   . Hydrocephalus (HCC)   . Hypertension   . Obesity   . Trichimoniasis     Past Surgical History:  Procedure Laterality Date  . BRAIN SURGERY      Had hydrocephalus and fluid was removed in 2009 (no shunt).      Family History  Problem Relation Age of Onset  . Hypertension Mother   . Hypertension Maternal Grandmother     Social History   Tobacco Use  . Smoking status: Former Games developer  . Smokeless tobacco: Never Used  Substance Use Topics  . Alcohol use: Not Currently    Comment: last drink 09/2018  . Drug use: Yes    Types: Marijuana    Comment: Occasionally per pt     Allergies:  Allergies  Allergen Reactions  . Pineapple Itching  . Pollen Extract   . Strawberry Extract Itching    Medications Prior to Admission  Medication Sig Dispense Refill Last Dose  . promethazine (PHENERGAN) 25 MG tablet Take 1 tablet (25 mg total) by mouth every 8 (eight) hours as needed for nausea or vomiting. 12 tablet 0 05/09/2020 at Unknown time  . acetaminophen (TYLENOL) 500 MG tablet Take 2 tablets (1,000 mg total) by mouth every 6 (six) hours as needed for fever or headache. 30 tablet 0 Unknown at Unknown time  . ibuprofen (ADVIL) 600 MG tablet Take 1 tablet (600 mg total) by mouth every 6 (six) hours. (Patient not taking: Reported on 05/08/2020) 30 tablet 0 Unknown at Unknown time  . medroxyPROGESTERone (DEPO-PROVERA) 150 MG/ML injection Inject 1 mL (150 mg total) into the muscle every 3 (three)  months. (Patient not taking: Reported on 05/08/2020) 1 mL 4 Unknown at Unknown time  . nitrofurantoin, macrocrystal-monohydrate, (MACROBID) 100 MG capsule Take 1 capsule (100 mg total) by mouth 2 (two) times daily. X 7 days 14 capsule 0 Unknown at Unknown time  . pantoprazole (PROTONIX) 40 MG tablet Take 1 tablet (40 mg total) by mouth 2 (two) times daily. (Patient not taking: Reported on 05/08/2020) 30 tablet 1 Unknown at Unknown time  . QUEtiapine (SEROQUEL) 50 MG tablet Take 2 tablets by mouth at bedtime.   Unknown at Unknown time    Review of Systems  Constitutional: Negative for chills, diaphoresis, fatigue and fever.  Eyes: Negative for visual disturbance.  Respiratory: Negative for shortness of breath.    Cardiovascular: Negative for chest pain.  Gastrointestinal: Positive for nausea and vomiting. Negative for abdominal pain, constipation and diarrhea.  Genitourinary: Positive for pelvic pain. Negative for dysuria, flank pain, frequency, urgency, vaginal bleeding and vaginal discharge.  Neurological: Negative for dizziness, weakness, light-headedness and headaches.   Physical Exam   Blood pressure 131/66, pulse (!) 114, temperature 98.8 F (37.1 C), temperature source Oral, resp. rate 16, unknown if currently breastfeeding.  Patient Vitals for the past 24 hrs:  BP Temp Temp src Pulse Resp  05/10/20 0128 131/66 98.8 F (37.1 C) Oral (!) 114 16   Physical Exam  Constitutional: She is oriented to person, place, and time. She appears well-developed and well-nourished. No distress.  HENT:  Head: Normocephalic and atraumatic.  Respiratory: Effort normal.  GI: Soft. She exhibits no distension and no mass. There is abdominal tenderness in the right upper quadrant and right lower quadrant. There is no rebound and no guarding.  Patient reports tenderness is mostly localized to RLQ on palpation, but also experiences mild tenderness in RUQ on palpation.  Genitourinary:    No vaginal discharge.     Genitourinary Comments: Negative CMT No blood noted on exam glove.    Neurological: She is alert and oriented to person, place, and time.  Skin: Skin is warm and dry. She is not diaphoretic.  Psychiatric: She has a normal mood and affect. Her behavior is normal. Judgment and thought content normal.   Results for orders placed or performed during the hospital encounter of 05/10/20 (from the past 24 hour(s))  Urinalysis, Routine w reflex microscopic     Status: Abnormal   Collection Time: 05/10/20  2:40 AM  Result Value Ref Range   Color, Urine AMBER (A) YELLOW   APPearance CLOUDY (A) CLEAR   Specific Gravity, Urine 1.027 1.005 - 1.030   pH 5.0 5.0 - 8.0   Glucose, UA 50 (A) NEGATIVE mg/dL   Hgb  urine dipstick MODERATE (A) NEGATIVE   Bilirubin Urine SMALL (A) NEGATIVE   Ketones, ur 20 (A) NEGATIVE mg/dL   Protein, ur >=294 (A) NEGATIVE mg/dL   Nitrite NEGATIVE NEGATIVE   Leukocytes,Ua SMALL (A) NEGATIVE   RBC / HPF 11-20 0 - 5 RBC/hpf   WBC, UA 11-20 0 - 5 WBC/hpf   Bacteria, UA FEW (A) NONE SEEN   Squamous Epithelial / LPF 11-20 0 - 5   Mucus PRESENT   CBC with Differential/Platelet     Status: Abnormal   Collection Time: 05/10/20  5:16 AM  Result Value Ref Range   WBC 22.2 (H) 4.0 - 10.5 K/uL   RBC 4.92 3.87 - 5.11 MIL/uL   Hemoglobin 14.8 12.0 - 15.0 g/dL   HCT 76.5 (H) 46.5 - 03.5 %   MCV  93.7 80.0 - 100.0 fL   MCH 30.1 26.0 - 34.0 pg   MCHC 32.1 30.0 - 36.0 g/dL   RDW 27.2 53.6 - 64.4 %   Platelets 391 150 - 400 K/uL   nRBC 0.0 0.0 - 0.2 %   Neutrophils Relative % 78 %   Neutro Abs 17.3 (H) 1.7 - 7.7 K/uL   Lymphocytes Relative 15 %   Lymphs Abs 3.3 0.7 - 4.0 K/uL   Monocytes Relative 6 %   Monocytes Absolute 1.4 (H) 0.1 - 1.0 K/uL   Eosinophils Relative 0 %   Eosinophils Absolute 0.0 0.0 - 0.5 K/uL   Basophils Relative 0 %   Basophils Absolute 0.1 0.0 - 0.1 K/uL   Immature Granulocytes 1 %   Abs Immature Granulocytes 0.10 (H) 0.00 - 0.07 K/uL  Comprehensive metabolic panel     Status: Abnormal   Collection Time: 05/10/20  5:20 AM  Result Value Ref Range   Sodium 138 135 - 145 mmol/L   Potassium 2.7 (LL) 3.5 - 5.1 mmol/L   Chloride 100 98 - 111 mmol/L   CO2 21 (L) 22 - 32 mmol/L   Glucose, Bld 120 (H) 70 - 99 mg/dL   BUN 8 6 - 20 mg/dL   Creatinine, Ser 0.34 0.44 - 1.00 mg/dL   Calcium 9.4 8.9 - 74.2 mg/dL   Total Protein 7.9 6.5 - 8.1 g/dL   Albumin 4.0 3.5 - 5.0 g/dL   AST 22 15 - 41 U/L   ALT 17 0 - 44 U/L   Alkaline Phosphatase 63 38 - 126 U/L   Total Bilirubin 1.4 (H) 0.3 - 1.2 mg/dL   GFR calc non Af Amer >60 >60 mL/min   GFR calc Af Amer >60 >60 mL/min   Anion gap 17 (H) 5 - 15  Rapid urine drug screen (hospital performed)     Status:  Abnormal   Collection Time: 05/10/20  6:08 AM  Result Value Ref Range   Opiates NONE DETECTED NONE DETECTED   Cocaine NONE DETECTED NONE DETECTED   Benzodiazepines NONE DETECTED NONE DETECTED   Amphetamines NONE DETECTED NONE DETECTED   Tetrahydrocannabinol POSITIVE (A) NONE DETECTED   Barbiturates NONE DETECTED NONE DETECTED   US OB Comp < 14 Wks  Result Date: 05/08/2020 CLINICAL DATA:  Initial evaluation for acute right lower quadrant pain, positive beta HCG. EXAM: OBSTETRIC <14 WK ULTRASOUND TECHNIQUE: Transabdominal ultrasound was performed for evaluation of the gestation as well as the maternal uterus and adnexal regions. COMPARISON:  None available. FINDINGS: Intrauterine gestational sac: Single Yolk sac:  Present Embryo:  Present Cardiac Activity: Present Heart Rate: 141 bpm CRL: 9.5 mm   7 w 0 d                  [place] EDC: 12/25/2020. Subchorionic hemorrhage:  None visualized. Maternal uterus/adnexae: Ovaries within normal limits bilaterally. 4.3 x 3.1 x 3.6 cm simple left ovarian cyst noted. No adnexal mass or free fluid within the pelvis. IMPRESSION: 1. Single viable intrauterine pregnancy as above, estimated gestational age [redacted] weeks and 0 days by crown-rump length, with ultrasound EDC of 12/25/2020. No complication. 2. 4.3 cm simple left ovarian cyst. These are a common finding in premenopausal females, and is almost certainly benign. No follow-up imaging recommended. 3. No other acute maternal uterine or adnexal abnormality identified. Electronically Signed   By: Rise Mu M.D.   On: 05/08/2020 04:54    MAU Course  Procedures  MDM -N/V in  pregnancy -confirmed IUP on 05/08/2020 -+trich on 05/08/2020, tx'd in ED same day + rocephin 1g -GC/CT collected that day, results pending -weight 05/08/2020 90kg, no weight available for comparison -UA: amber/cloudy/50GLU/mod hgb/sm bilirubin/20ketones/>/=300PRO/sm leuks/rare bacteria, sending urine for culture -UDS: +THC -CBC w/Diff:  WBCs 22.2 (up from 15.3 2days ago) -CMP: K 2.7, Bilirubin 1.4, Anion gap 17 -1L LR +  Pepcid + 12.5mg  Phenergan given given, pt reports NV now mostly resolved -pt able to urinate after fluids -clarified with patient location of pain now that she is more awake; pt does endorse pain in RLQ at this time; given N/V, WBCs of 22.2 that increased over the past two days and RLQ pain, will obtain MRI to r/o appendicitis -MRI/Abdomen/Pelvis/without contrast ordered -NPO -care transferred to Venia Carbon, NP  Nugent, Odie Sera, NP  8:19 AM 05/10/2020  Care resumed at 0819 Patient in MRI at this time Bimanual exam with no CMT; unlikely PID She is without Flank pain MRI negative for appendicitis. 40 Meq of K dur given, azithromycin given oral 1 gram. She refused this in the ED yesterday. Many WBC on wet prep and + trichomonas. High risk for GC/chlamydia. Swabs pending. She received Rocephin in ED yesterday.  Patient tolerated oral medications and fluids. She is ready for DC home Discussed patient with Dr. Earlene Plater who is agreeable with plan of care.   Orders Placed This Encounter  Procedures  . Culture, OB Urine    Standing Status:   Standing    Number of Occurrences:   1  . MR ABDOMEN WO CONTRAST    Standing Status:   Standing    Number of Occurrences:   1    Order Specific Question:   What is the patient's sedation requirement?    Answer:   No Sedation    Order Specific Question:   Does the patient have a pacemaker or implanted devices?    Answer:   No    Order Specific Question:   Radiology Contrast Protocol - do NOT remove file path    Answer:   \\[email or web address]  . MR PELVIS WO CONTRAST    Standing Status:   Standing    Number of Occurrences:   1    Order Specific Question:   What is the patient's sedation requirement?    Answer:   No Sedation    Order Specific Question:   Does the patient have a pacemaker or implanted devices?    Answer:   No     Order Specific Question:   Radiology Contrast Protocol - do NOT remove file path    Answer:   \\[email or web address]  . Urinalysis, Routine w reflex microscopic    Standing Status:   Standing    Number of Occurrences:   1  . CBC with Differential/Platelet    Standing Status:   Standing    Number of Occurrences:   1  . Comprehensive metabolic panel    Standing Status:   Standing    Number of Occurrences:   1  . Rapid urine drug screen (hospital performed)    Standing Status:   Standing    Number of Occurrences:   1  . Diet NPO time specified    Standing Status:   Standing    Number of Occurrences:   1  . Insert peripheral IV    Standing Status:   Standing    Number of Occurrences:   1   Meds ordered this encounter  Medications  . famotidine (PEPCID)  IVPB 20 mg premix  . promethazine (PHENERGAN) injection 12.5 mg  . lactated ringers bolus 1,000 mL     MR PELVIS WO CONTRAST  Result Date: 05/10/2020 CLINICAL DATA:  Right lower quadrant abdominal pain and nausea for 1 week. Leukocytosis. Clinical concern for appendicitis. First trimester pregnancy. EXAM: MRI ABDOMEN AND PELVIS WITHOUT CONTRAST TECHNIQUE: Multiplanar multisequence MR imaging of the abdomen and pelvis was performed. No intravenous contrast was administered. COMPARISON:  Obstetric ultrasound 05/08/2020 FINDINGS: COMBINED FINDINGS FOR BOTH MR ABDOMEN AND PELVIS Lower chest:  The visualized lower chest appears unremarkable. Hepatobiliary: The liver is normal in signal without focal abnormality. No evidence of gallstones, gallbladder wall thickening or biliary dilatation. Pancreas: Unremarkable. No pancreatic ductal dilatation or surrounding inflammatory changes. Spleen: Normal in size without focal abnormality. Adrenals/Urinary Tract: Both adrenal glands appear normal. Both kidneys appear normal. No hydronephrosis or perinephric soft tissue stranding. The bladder appears normal for its degree of distention.  Stomach/Bowel: No evidence of bowel wall thickening, distention or surrounding inflammatory change. The appendix is well visualized in the right pelvis and appears normal. There is no periappendiceal inflammation. Vascular/Lymphatic: There are no enlarged abdominal or pelvic lymph nodes. No significant vascular findings. Reproductive: Intrauterine gestational sac measures 2.5 cm on image 25/10. As seen on ultrasound, there is a simple left renal cyst measuring 4.9 x 4.0 cm on image 21/10. No right adnexal mass. A small amount of free pelvic fluid is within physiologic limits. Other: No abdominal ascites. No focal extraluminal fluid collections. Musculoskeletal: No acute or significant osseous findings. IMPRESSION: 1. No evidence of acute appendicitis or other acute process. 2. Simple left ovarian cyst as seen on recent ultrasound. Small amount of free pelvic fluid, within physiologic limits. 3. Early intrauterine gestation. Electronically Signed   By: Richardean Sale M.D.   On: 05/10/2020 10:10   MR ABDOMEN WO CONTRAST  Result Date: 05/10/2020 CLINICAL DATA:  Right lower quadrant abdominal pain and nausea for 1 week. Leukocytosis. Clinical concern for appendicitis. First trimester pregnancy. EXAM: MRI ABDOMEN AND PELVIS WITHOUT CONTRAST TECHNIQUE: Multiplanar multisequence MR imaging of the abdomen and pelvis was performed. No intravenous contrast was administered. COMPARISON:  Obstetric ultrasound 05/08/2020 FINDINGS: COMBINED FINDINGS FOR BOTH MR ABDOMEN AND PELVIS Lower chest:  The visualized lower chest appears unremarkable. Hepatobiliary: The liver is normal in signal without focal abnormality. No evidence of gallstones, gallbladder wall thickening or biliary dilatation. Pancreas: Unremarkable. No pancreatic ductal dilatation or surrounding inflammatory changes. Spleen: Normal in size without focal abnormality. Adrenals/Urinary Tract: Both adrenal glands appear normal. Both kidneys appear normal. No  hydronephrosis or perinephric soft tissue stranding. The bladder appears normal for its degree of distention. Stomach/Bowel: No evidence of bowel wall thickening, distention or surrounding inflammatory change. The appendix is well visualized in the right pelvis and appears normal. There is no periappendiceal inflammation. Vascular/Lymphatic: There are no enlarged abdominal or pelvic lymph nodes. No significant vascular findings. Reproductive: Intrauterine gestational sac measures 2.5 cm on image 25/10. As seen on ultrasound, there is a simple left renal cyst measuring 4.9 x 4.0 cm on image 21/10. No right adnexal mass. A small amount of free pelvic fluid is within physiologic limits. Other: No abdominal ascites. No focal extraluminal fluid collections. Musculoskeletal: No acute or significant osseous findings. IMPRESSION: 1. No evidence of acute appendicitis or other acute process. 2. Simple left ovarian cyst as seen on recent ultrasound. Small amount of free pelvic fluid, within physiologic limits. 3. Early intrauterine gestation. Electronically Signed   By: Gwyndolyn Saxon  Purcell MoutonVeazey M.D.   On: 05/10/2020 10:10    Assessment and Plan    A:  1. Leukocytosis, unspecified type   2. Hypokalemia   3. Nausea and vomiting during pregnancy   4. Cannabis use disorder, mild, abuse   5. Concern about STD in female without diagnosis     P:  Discharge home with strict return precautions Rx: K Dur X 7 days, Zofran Stop smoke marijuana. This may be contributing to your vomiting GC/Chlamydia pending Return to [facility]U if symptoms worsen Continue Macrobid per ED Urine culture is pending.   Duane Lopeasch, Marc Sivertsen I, NP 05/10/2020 7:10 PM

## 2020-05-11 LAB — CULTURE, OB URINE

## 2020-05-13 ENCOUNTER — Encounter (HOSPITAL_COMMUNITY): Payer: Self-pay | Admitting: Obstetrics & Gynecology

## 2020-05-13 ENCOUNTER — Other Ambulatory Visit: Payer: Self-pay

## 2020-05-13 ENCOUNTER — Inpatient Hospital Stay (HOSPITAL_COMMUNITY)
Admission: AD | Admit: 2020-05-13 | Discharge: 2020-05-14 | Disposition: A | Discharge status: Home / Self Care | Payer: Medicaid Other | Attending: Obstetrics & Gynecology | Admitting: Obstetrics & Gynecology

## 2020-05-13 DIAGNOSIS — Z9889 Other specified postprocedural states: Secondary | ICD-10-CM | POA: Diagnosis not present

## 2020-05-13 DIAGNOSIS — R112 Nausea with vomiting, unspecified: Secondary | ICD-10-CM | POA: Diagnosis not present

## 2020-05-13 DIAGNOSIS — Z91018 Allergy to other foods: Secondary | ICD-10-CM | POA: Diagnosis not present

## 2020-05-13 DIAGNOSIS — Z87891 Personal history of nicotine dependence: Secondary | ICD-10-CM | POA: Diagnosis not present

## 2020-05-13 DIAGNOSIS — I10 Essential (primary) hypertension: Secondary | ICD-10-CM | POA: Insufficient documentation

## 2020-05-13 DIAGNOSIS — Z8249 Family history of ischemic heart disease and other diseases of the circulatory system: Secondary | ICD-10-CM | POA: Insufficient documentation

## 2020-05-13 DIAGNOSIS — Z79899 Other long term (current) drug therapy: Secondary | ICD-10-CM | POA: Insufficient documentation

## 2020-05-13 DIAGNOSIS — Z332 Encounter for elective termination of pregnancy: Secondary | ICD-10-CM

## 2020-05-13 LAB — CBC
HCT: 39.5 % (ref 36.0–46.0)
Hemoglobin: 13.7 g/dL (ref 12.0–15.0)
MCH: 30.2 pg (ref 26.0–34.0)
MCHC: 34.7 g/dL (ref 30.0–36.0)
MCV: 87 fL (ref 80.0–100.0)
Platelets: 407 10*3/uL — ABNORMAL HIGH (ref 150–400)
RBC: 4.54 MIL/uL (ref 3.87–5.11)
RDW: 13.5 % (ref 11.5–15.5)
WBC: 19.4 10*3/uL — ABNORMAL HIGH (ref 4.0–10.5)
nRBC: 0 % (ref 0.0–0.2)

## 2020-05-13 LAB — HCG, QUANTITATIVE, PREGNANCY: hCG, Beta Chain, Quant, S: 36472 m[IU]/mL — ABNORMAL HIGH (ref <5)

## 2020-05-13 MED ORDER — LACTATED RINGERS IV BOLUS
1000.0000 mL | Freq: Once | INTRAVENOUS | Status: AC
  Administered 2020-05-13: 1000 mL via INTRAVENOUS

## 2020-05-13 MED ORDER — LABETALOL HCL 100 MG PO TABS
100.0000 mg | ORAL_TABLET | Freq: Once | ORAL | Status: AC
  Administered 2020-05-13: 100 mg via ORAL
  Filled 2020-05-13: qty 1

## 2020-05-13 MED ORDER — PROMETHAZINE HCL 25 MG/ML IJ SOLN
12.5000 mg | Freq: Once | INTRAMUSCULAR | Status: AC
  Administered 2020-05-13: 12.5 mg via INTRAVENOUS
  Filled 2020-05-13: qty 1

## 2020-05-13 MED ORDER — LABETALOL HCL 100 MG PO TABS: 100.0000 mg | ORAL_TABLET | Freq: Two times a day (BID) | ORAL | 1 refills | Status: AC

## 2020-05-13 NOTE — Discharge Instructions (Signed)
Nausea and Vomiting, Adult Nausea is the feeling that you have an upset stomach or that you are about to vomit. Vomiting is when stomach contents are thrown up and out of the mouth as a result of nausea. Vomiting can make you feel weak and cause you to become dehydrated. Dehydration can make you feel tired and thirsty, cause you to have a dry mouth, and decrease how often you urinate. Older adults and people with other diseases or a weak disease-fighting system (immune system) are at higher risk for dehydration. It is important to treat your nausea and vomiting as told by your health care provider. Follow these instructions at home: Watch your symptoms for any changes. Tell your health care provider about them. Follow these instructions to care for yourself at home. Eating and drinking      Take an oral rehydration solution (ORS). This is a drink that is sold at pharmacies and retail stores.  Drink clear fluids slowly and in small amounts as you are able. Clear fluids include water, ice chips, low-calorie sports drinks, and fruit juice that has water added (diluted fruit juice).  Eat bland, easy-to-digest foods in small amounts as you are able. These foods include bananas, applesauce, rice, lean meats, toast, and crackers.  Avoid fluids that contain a lot of sugar or caffeine, such as energy drinks, sports drinks, and soda.  Avoid alcohol.  Avoid spicy or fatty foods. General instructions  Take over-the-counter and prescription medicines only as told by your health care provider.  Drink enough fluid to keep your urine pale yellow.  Wash your hands often using soap and water. If soap and water are not available, use hand sanitizer.  Make sure that all people in your household wash their hands well and often.  Rest at home while you recover.  Watch your condition for any changes.  Breathe slowly and deeply when you feel nauseated.  Keep all follow-up visits as told by your health  care provider. This is important. Contact a health care provider if:  Your symptoms get worse.  You have new symptoms.  You have a fever.  You cannot drink fluids without vomiting.  Your nausea does not go away after 2 days.  You feel light-headed or dizzy.  You have a headache.  You have muscle cramps.  You have a rash.  You have pain while urinating. Get help right away if:  You have pain in your chest, neck, arm, or jaw.  You feel extremely weak or you faint.  You have persistent vomiting.  You have vomit that is bright red or looks like black coffee grounds.  You have bloody or black stools or stools that look like tar.  You have a severe headache, a stiff neck, or both.  You have severe pain, cramping, or bloating in your abdomen.  You have difficulty breathing, or you are breathing very quickly.  Your heart is beating very quickly.  Your skin feels cold and clammy.  You feel confused.  You have signs of dehydration, such as: ? Dark urine, very little urine, or no urine. ? Cracked lips. ? Dry mouth. ? Sunken eyes. ? Sleepiness. ? Weakness. These symptoms may represent a serious problem that is an emergency. Do not wait to see if the symptoms will go away. Get medical help right away. Call your local emergency services (911 in the [place] Do not drive yourself to the hospital. Summary  Nausea is the feeling that you have an upset stomach   or that you are about to vomit. As nausea gets worse, it can lead to vomiting. Vomiting can make you feel weak and cause you to become dehydrated.  Follow instructions from your health care provider about eating and drinking to prevent dehydration.  Take over-the-counter and prescription medicines only as told by your health care provider.  Contact your health care provider if your symptoms get worse, or you have new symptoms.  Keep all follow-up visits as told by your health care provider. This is important. This  information is not intended to replace advice given to you by your health care provider. Make sure you discuss any questions you have with your health care provider. Document Revised: 03/28/2019 Document Reviewed: 05/14/2018 Elsevier Patient Education  2020 [employer]  

## 2020-05-13 NOTE — MAU Provider Note (Addendum)
History     CSN: [id number]  Arrival date and time: 05/13/20 2045   First Provider Initiated Contact with Patient 05/13/20 2122      Chief Complaint  Patient presents with  . Nausea  . Emesis   Jean Mata is a 29 y.o. [id number] at [redacted]w[redacted]d by LMP of 05/08/2020.  She presents today for Nausea and Emesis.  She states she has been experiencing her symptoms for about one week.  She states she had an incident of vomiting one hour ago.  She reports trying "zofran and stuff" for her symptoms with no relief.  She states her last dose was yesterday.  Patient endorses that she had a surgical abortion this morning at [facility].  She states her bleeding "is fine I guess."       OB History    Gravida  3   Para  1   Term      Preterm  1   AB  1   Living  1     SAB  1   TAB      Ectopic      Multiple      Live Births  1           Past Medical History:  Diagnosis Date  . Asthma   . Bipolar disorder (HCC)   . Depression   . Elevated LFTs   . Hydrocephalus (HCC)   . Hypertension   . Obesity   . Trichimoniasis     Past Surgical History:  Procedure Laterality Date  . BRAIN SURGERY      Had hydrocephalus and fluid was removed in 2009 (no shunt).     Family History  Problem Relation Age of Onset  . Hypertension Mother   . Hypertension Maternal Grandmother     Social History   Tobacco Use  . Smoking status: Former Games developer  . Smokeless tobacco: Never Used  Substance Use Topics  . Alcohol use: Not Currently    Comment: last drink 09/2018  . Drug use: Not Currently    Types: Marijuana    Comment: Occasionally per pt     Allergies:  Allergies  Allergen Reactions  . Pineapple Itching  . Pollen Extract   . Strawberry Extract Itching    Medications Prior to Admission  Medication Sig Dispense Refill Last Dose  . acetaminophen (TYLENOL) 500 MG tablet Take 2 tablets (1,000 mg total) by mouth every 6 (six) hours as needed for fever or headache. 30 tablet  0 05/12/2020 at Unknown time  . nitrofurantoin, macrocrystal-monohydrate, (MACROBID) 100 MG capsule Take 1 capsule (100 mg total) by mouth 2 (two) times daily. X 7 days 14 capsule 0 Past Week at Unknown time  . ondansetron (ZOFRAN ODT) 8 MG disintegrating tablet Take 1 tablet (8 mg total) by mouth every 8 (eight) hours as needed for nausea or vomiting. 20 tablet 0 Past Week at Unknown time  . potassium chloride 20 MEQ TBCR Take 10 mEq by mouth 2 (two) times daily for 7 days. 14 tablet 0 Past Week at Unknown time  . promethazine (PHENERGAN) 25 MG tablet Take 1 tablet (25 mg total) by mouth every 8 (eight) hours as needed for nausea or vomiting. 12 tablet 0 05/12/2020 at Unknown time  . QUEtiapine (SEROQUEL) 50 MG tablet Take 2 tablets by mouth at bedtime.   05/12/2020 at Unknown time  . pantoprazole (PROTONIX) 40 MG tablet Take 1 tablet (40 mg total) by mouth 2 (two) times daily. (Patient  not taking: Reported on 05/08/2020) 30 tablet 1     Review of Systems  Constitutional: Positive for chills. Negative for fever.  Respiratory: Negative for cough and shortness of breath.   Gastrointestinal: Positive for abdominal pain ("Aching"), nausea and vomiting. Negative for constipation and diarrhea.  Genitourinary: Positive for vaginal bleeding. Negative for difficulty urinating, dysuria, pelvic pain and vaginal discharge.  Musculoskeletal: Negative for back pain.  Neurological: Positive for headaches ("earlier" none currently). Negative for dizziness and light-headedness.   Physical Exam   Blood pressure (!) 148/102, pulse 90, temperature 97.9 F (36.6 C), temperature source Oral, resp. rate 16, SpO2 100 %, unknown if currently breastfeeding.  Physical Exam  Constitutional: She is oriented to person, place, and time. She appears well-developed and well-nourished. No distress.  Patient laying in fetal position. Eyes closed, but responsive to questions.   HENT:  Head: Normocephalic and atraumatic.  Eyes:  Conjunctivae are normal.  Cardiovascular: Normal rate, regular rhythm and normal heart sounds.  Respiratory: Effort normal and breath sounds normal. No respiratory distress.  GI: Soft. There is no abdominal tenderness.  Musculoskeletal:        General: Normal range of motion.     Cervical back: Normal range of motion.  Neurological: She is alert and oriented to person, place, and time.  Skin: Skin is warm and dry.  Psychiatric: She has a normal mood and affect. Her behavior is normal.    MAU Course  Procedures Results for orders placed or performed during the hospital encounter of 05/13/20 (from the past 24 hour(s))  CBC     Status: Abnormal   Collection Time: 05/13/20  9:58 PM  Result Value Ref Range   WBC 19.4 (H) 4.0 - 10.5 K/uL   RBC 4.54 3.87 - 5.11 MIL/uL   Hemoglobin 13.7 12.0 - 15.0 g/dL   HCT 39.5 36.0 - 46.0 %   MCV 87.0 80.0 - 100.0 fL   MCH 30.2 26.0 - 34.0 pg   MCHC 34.7 30.0 - 36.0 g/dL   RDW 13.5 11.5 - 15.5 %   Platelets 407 (H) 150 - 400 K/uL   nRBC 0.0 0.0 - 0.2 %    MDM Start IV LR Bolus Antiemetic Labs:CBC, hCG Assessment and Plan  29 year old [id number] S/P TAB Nausea/Vomiting  -POC reviewed. -Patient states she does not think she can tolerate oral medications.  -Informed that IV would be started and labs collected. -Will obtain CBC and hCG for reference. -Give LR -Phenergan 25mg  IV -Will perform oral challenge after completion of fluids.   Maryann Conners 05/13/2020, 9:23 PM   Reassessment (10:52 PM)  -Patient reports improvement with phenergan dosing. -Declines oral challenge. -Encouraged to go home and rest. -Reports she has no follow up with [facility] -Informed that if other issues arise she is to go to [facility] for evaluation. -Discussed need for follow up regarding elevated blood pressures. -Patient without questions or concerns. -Discharged to home in improved condition.  Maryann Conners MSN, CNM Advanced Practice Provider, Center for [facility]  Reassessment (11:06 PM)  -Discharge blood pressure 160s/105s. -Patient agreeable to initiation of oral antihypertensives. -Dr. Nelta Numbers consulted and agrees. Advises: *Start Labetalol 100mg  BID -Will give initial dose here. -Script sent to pharmacy on file. -Patient instructed to obtain PCP and follow up. -Information given.   Maryann Conners MSN, CNM Advanced Practice Provider, Center for [employer] [facility]

## 2020-05-13 NOTE — MAU Note (Signed)
Pt states that [facility]    Pt reports feeling nausea and having vomiting.

## 2020-08-09 ENCOUNTER — Other Ambulatory Visit: Payer: Self-pay

## 2020-08-09 ENCOUNTER — Emergency Department (HOSPITAL_COMMUNITY)
Admission: EM | Admit: 2020-08-09 | Discharge: 2020-08-09 | Disposition: A | Discharge status: Home / Self Care | Payer: Medicaid Other | Attending: Emergency Medicine | Admitting: Emergency Medicine

## 2020-08-09 ENCOUNTER — Encounter (HOSPITAL_COMMUNITY): Payer: Self-pay

## 2020-08-09 DIAGNOSIS — S01511A Laceration without foreign body of lip, initial encounter: Secondary | ICD-10-CM | POA: Diagnosis present

## 2020-08-09 DIAGNOSIS — Y999 Unspecified external cause status: Secondary | ICD-10-CM | POA: Diagnosis not present

## 2020-08-09 DIAGNOSIS — Y929 Unspecified place or not applicable: Secondary | ICD-10-CM | POA: Insufficient documentation

## 2020-08-09 DIAGNOSIS — Z5321 Procedure and treatment not carried out due to patient leaving prior to being seen by health care provider: Secondary | ICD-10-CM | POA: Diagnosis not present

## 2020-08-09 DIAGNOSIS — Y9389 Activity, other specified: Secondary | ICD-10-CM | POA: Diagnosis not present

## 2020-08-09 DIAGNOSIS — W500XXA Accidental hit or strike by another person, initial encounter: Secondary | ICD-10-CM | POA: Diagnosis not present
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
